# Patient Record
Sex: Female | Born: 1955 | Race: White | Hispanic: No | Smoking: Former smoker
Health system: Southern US, Community
[De-identification: ages and names within clinical notes are randomized; demographics above are authoritative.]

## PROBLEM LIST (undated history)

## (undated) ENCOUNTER — Emergency Department (HOSPITAL_COMMUNITY): Discharge status: Absent without leave | Payer: Medicare Other

## (undated) DIAGNOSIS — I2510 Atherosclerotic heart disease of native coronary artery without angina pectoris: Secondary | ICD-10-CM

## (undated) DIAGNOSIS — J302 Other seasonal allergic rhinitis: Secondary | ICD-10-CM

## (undated) DIAGNOSIS — B009 Herpesviral infection, unspecified: Secondary | ICD-10-CM

## (undated) DIAGNOSIS — K219 Gastro-esophageal reflux disease without esophagitis: Secondary | ICD-10-CM

## (undated) DIAGNOSIS — J439 Emphysema, unspecified: Secondary | ICD-10-CM

## (undated) DIAGNOSIS — K5900 Constipation, unspecified: Secondary | ICD-10-CM

## (undated) DIAGNOSIS — F329 Major depressive disorder, single episode, unspecified: Secondary | ICD-10-CM

## (undated) DIAGNOSIS — F32A Depression, unspecified: Secondary | ICD-10-CM

## (undated) DIAGNOSIS — T7840XA Allergy, unspecified, initial encounter: Secondary | ICD-10-CM

## (undated) DIAGNOSIS — F419 Anxiety disorder, unspecified: Secondary | ICD-10-CM

## (undated) HISTORY — DX: Emphysema, unspecified: J43.9

## (undated) HISTORY — DX: Allergy, unspecified, initial encounter: T78.40XA

## (undated) HISTORY — DX: Depression, unspecified: F32.A

## (undated) HISTORY — DX: Herpesviral infection, unspecified: B00.9

## (undated) HISTORY — PX: DILATION AND CURETTAGE OF UTERUS: SHX78

## (undated) HISTORY — DX: Other seasonal allergic rhinitis: J30.2

## (undated) HISTORY — PX: POLYPECTOMY: SHX149

## (undated) HISTORY — DX: Atherosclerotic heart disease of native coronary artery without angina pectoris: I25.10

## (undated) HISTORY — DX: Major depressive disorder, single episode, unspecified: F32.9

## (undated) HISTORY — DX: Constipation, unspecified: K59.00

## (undated) HISTORY — DX: Anxiety disorder, unspecified: F41.9

## (undated) HISTORY — PX: COLONOSCOPY: SHX174

## (undated) HISTORY — DX: Gastro-esophageal reflux disease without esophagitis: K21.9

---
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
## 1998-03-02 ENCOUNTER — Other Ambulatory Visit: Admission: RE | Admit: 1998-03-02 | Discharge: 1998-03-02 | Payer: Self-pay | Admitting: *Deleted

## 1998-05-09 ENCOUNTER — Encounter: Payer: Self-pay | Admitting: Pulmonary Disease

## 1999-04-20 ENCOUNTER — Other Ambulatory Visit: Admission: RE | Admit: 1999-04-20 | Discharge: 1999-04-20 | Payer: Self-pay | Admitting: *Deleted

## 1999-09-10 ENCOUNTER — Other Ambulatory Visit: Admission: RE | Admit: 1999-09-10 | Discharge: 1999-09-10 | Payer: Self-pay | Admitting: *Deleted

## 2000-11-28 ENCOUNTER — Other Ambulatory Visit: Admission: RE | Admit: 2000-11-28 | Discharge: 2000-11-28 | Payer: Self-pay | Admitting: *Deleted

## 2002-02-12 ENCOUNTER — Other Ambulatory Visit: Admission: RE | Admit: 2002-02-12 | Discharge: 2002-02-12 | Payer: Self-pay | Admitting: *Deleted

## 2003-04-22 ENCOUNTER — Other Ambulatory Visit: Admission: RE | Admit: 2003-04-22 | Discharge: 2003-04-22 | Payer: Self-pay | Admitting: *Deleted

## 2003-06-16 ENCOUNTER — Emergency Department (HOSPITAL_COMMUNITY): Admission: EM | Admit: 2003-06-16 | Discharge: 2003-06-16 | Payer: Self-pay | Admitting: Emergency Medicine

## 2004-04-26 ENCOUNTER — Other Ambulatory Visit: Admission: RE | Admit: 2004-04-26 | Discharge: 2004-04-26 | Payer: Self-pay | Admitting: *Deleted

## 2005-02-07 ENCOUNTER — Observation Stay (HOSPITAL_COMMUNITY): Admission: EM | Admit: 2005-02-07 | Discharge: 2005-02-08 | Payer: Self-pay | Admitting: Emergency Medicine

## 2005-02-08 ENCOUNTER — Encounter (INDEPENDENT_AMBULATORY_CARE_PROVIDER_SITE_OTHER): Payer: Self-pay | Admitting: Cardiology

## 2005-09-06 ENCOUNTER — Other Ambulatory Visit: Admission: RE | Admit: 2005-09-06 | Discharge: 2005-09-06 | Payer: Self-pay | Admitting: *Deleted

## 2006-09-04 ENCOUNTER — Other Ambulatory Visit: Admission: RE | Admit: 2006-09-04 | Discharge: 2006-09-04 | Payer: Self-pay | Admitting: *Deleted

## 2007-11-28 ENCOUNTER — Other Ambulatory Visit: Admission: RE | Admit: 2007-11-28 | Discharge: 2007-11-28 | Payer: Self-pay | Admitting: Gynecology

## 2007-12-16 ENCOUNTER — Emergency Department (HOSPITAL_COMMUNITY): Admission: EM | Admit: 2007-12-16 | Discharge: 2007-12-16 | Payer: Self-pay | Admitting: Emergency Medicine

## 2008-01-24 ENCOUNTER — Ambulatory Visit (HOSPITAL_COMMUNITY): Admission: RE | Admit: 2008-01-24 | Discharge: 2008-01-24 | Payer: Self-pay | Admitting: Obstetrics & Gynecology

## 2009-04-04 ENCOUNTER — Ambulatory Visit (HOSPITAL_COMMUNITY): Admission: RE | Admit: 2009-04-04 | Discharge: 2009-04-04 | Payer: Self-pay | Admitting: Emergency Medicine

## 2010-08-13 NOTE — Consult Note (Signed)
NAMEGWENETTA, Brandy Russell               ACCOUNT NO.:  [id number]   MEDICAL RECORD NO.:  [id number]          PATIENT TYPE:  OBV   LOCATION:  4729                         FACILITY:  [facility]   PHYSICIAN:  Corky Crafts, MDDATE OF BIRTH:  1955-06-01   DATE OF CONSULTATION:  02/07/2005  DATE OF DISCHARGE:                                   CONSULTATION   REFERRING PHYSICIAN:  Hollice Espy, M.D.   PRIMARY CARE PHYSICIAN:  Jethro Bastos, M.D.   CHIEF COMPLAINT:  Chest pain and palpitations.   HISTORY OF PRESENT ILLNESS:  The patient is a 55 year old woman with a  history of smoking who presented with chest pain and palpitations  intermittently over the past several weeks.  The palpitations are a regular  occurrence.  She has not felt her pulse during these episodes.  The chest  pain became very severe earlier today.  She became quite anxious as the pain  woke her up from sleep, radiated to her right chest and she had tingling in  her left arm.  Initially she thought this may be secondary to anxiety,  however,  the pain did not go away.  She presented to the emergency room for  further evaluation.  After receiving 1 mg of Ativan in the emergency room  she felt better.  She states that since she has been in the hospital, she  has not had any chest pain or palpitations.   PAST MEDICAL HISTORY:  1.  Tobacco abuse.  2.  Glucose intolerance by her report.  3.  Anxiety.  4.  Depression.  5.  Hypoglycemia at times.   CURRENT MEDICATIONS:  1.  Wellbutrin.  2.  Estrogen.   ALLERGIES:  1.  PENICILLIN.  2.  SULFA.   SOCIAL HISTORY:  Patient smokes a half pack per day for many years.  Occasional alcohol use.  No IV drug abuse.   REVIEW OF SYSTEMS:  No fever or chills.  No weight loss.  No focal weakness.  She does report chest pain and palpitations.  All other systems negative.   PHYSICAL EXAMINATION:  GENERAL APPEARANCE:  The patient is awake and alert,  in no apparent  distress.  VITAL SIGNS:  Afebrile, blood pressure 84/50, pulse of 95, respiratory rate  16.  NECK:  Supple.  No JVD.  LUNGS:  Clear to auscultation bilaterally.  CARDIOVASCULAR:  Regular rate and rhythm, S1 and S2.  ABDOMEN:  Soft, nontender and nondistended.  EXTREMITIES:  No edema.  Dorsalis pedis pulses 2+ bilaterally.  NEUROLOGIC:  No focal weakness.  SKIN:  No rashes.   LABORATORY DATA:  Troponin negative x2.  Creatinine 0.8.  Hematocrit 38.9.   ECG shows normal sinus rhythm, poor R-wave progression, no STT wave changes.   ASSESSMENT/PLAN:  A 55 year old with a history of tobacco abuse who reports  chest pain and palpitations.   PLAN:  1.  Cardiac:  No evidence of systolic dysfunction by exam.  I question the      accuracy of the ECG reporting of the old anterior myocardial infarction.  Would check echocardiogram to evaluate left ventricular function and      anterior wall motion.  If this is normal, it is a likely erroneous      reading by the ECG machine.  2.  Continue rule out myocardial infarction with enzymes.  3.  Telemetry to evaluate palpitations.  4.  Smoking cessation.  5.  Exercise Cardiolite in a.m. to evaluate for ischemia.           ______________________________  Corky Crafts, MD     JSV/MEDQ  D:  02/07/2005  T:  02/08/2005  Job:  [id number]   cc:   Jethro Bastos, M.D.  Fax: [phone]

## 2010-08-13 NOTE — H&P (Signed)
NAME:  Brandy Russell, Brandy Russell               ACCOUNT NO.:  [id number]   MEDICAL RECORD NO.:  [id number]          PATIENT TYPE:  OBV   LOCATION:  1825                         FACILITY:  [facility]   PHYSICIAN:  Hollice Espy, M.D.DATE OF BIRTH:  1955-07-26   DATE OF ADMISSION:  02/07/2005  DATE OF DISCHARGE:                                HISTORY & PHYSICAL   PRIMARY CARE PHYSICIAN:  Dr. Dorothe Pea   CHIEF COMPLAINT:  Chest palpitations.   Patient is a 55 year old white female with past medical history of tobacco  abuse, anxiety, and reported glucose intolerance who presents to the  emergency room after having repeated episodes of chest palpitation and  discomfort.  She describes that this has been going on now for several  weeks.  Usually described as sudden onset palpitations on the left side of  her chest occasionally radiating to the right side.  Usually these are self-  limited, but may occur at rest.  She thought it might be anxiety or so.  Became more concerned when last night in the middle of the night she woke up  to her left side of her chest pounding, radiating to her right side and up  into her jaw causing her shortness of breath.  She also got a tingling  sensation down her left arm.  She became concerned and called her ex-husband  who brought her into the emergency room.  Patient in the emergency room was  evaluated.  Laboratories including a white count, differential, coags, BMET,  and cardiac enzymes all of which were unremarkable.  Patient's vitals were  unremarkable as well.  She had an EKG done which showed no evidence of any  acute ST depression or ST elevation, although it was read as a possible age  indeterminate anterior infarct which is not impressive to me.  Currently,  patient states she is feeling much better.  She was given one dose of IV  Ativan 1 mg and feels much better.  She denies any headaches, vision  changes, aphasia, chest pain, palpitations, shortness of  breath, wheeze,  cough, abdominal pain, hematuria, dysuria, constipation, diarrhea, focal  extremity numbness, weakness or pain.   REVIEW OF SYSTEMS:  Otherwise negative.   PAST MEDICAL HISTORY:  1.  Tobacco abuse.  2.  Reported glucose intolerance.  3.  Anxiety.  4.  Depression.   MEDICATIONS:  1.  Wellbutrin 200 p.o. daily.  2.  Estrogen.   Reported allergies to PENICILLIN and/or SULFA.   FAMILY HISTORY:  Noted for a CAD.   SOCIAL HISTORY:  She admits to occasional drinking a glass of wine and she  denies any drug use, although she has experimented a long time ago back in  college and she is trying to quit.  She smoked about a pack to a half a pack  a day, but is aggressively trying to quit.   PHYSICAL EXAMINATION:  VITAL SIGNS:  Temperature 99, heart rate 82, blood  pressure 133/99, respirations 18.  Currently her blood pressure has gone  down to 103/60.  She is saturating 100% on room air.  GENERAL:  Patient is alert and oriented x3.  No apparent distress.  HEENT:  Normocephalic, atraumatic.  Her mucous membranes are moist.  She has  no carotid bruits.  HEART:  Regular rate and rhythm.  S1, S2.  LUNGS:  Clear to auscultation bilaterally.  ABDOMEN:  Soft, nontender, nondistended.  Positive bowel sounds.  EXTREMITIES:  No clubbing, cyanosis, edema.   Patient's white count 8.3, H&H 13.7, 38.9, MCV of 100, platelet count 325.  She had a 58% shift.  PT 11.4, INR 0.8, PTT 25.  Sodium 135, potassium 4.1,  chloride 106, bicarbonate 26, BUN 10, creatinine 0.8, glucose 93.  CPK 32.3,  MB less than 1, troponin I less than 0.05.  Second set of enzymes 35.8, less  than 1, troponin I less than 0.05.   ASSESSMENT/PLAN:  1.  Chest palpitations, discomfort, and associated symptoms.  Noted on      telemetry the patient is having frequent PACs which may be the cause of      her palpitation.  This may be all anxiety related.  However, given      reported old anterior infarct seen on  EKG, the patient's history of      tobacco use, and possible glucose intolerance, would favor keeping the      patient overnight for 24-hour observation checking two more sets of      cardiac enzymes and checking a repeat stress test in the morning.  Will      consult [facility] for assistance.  2.  Anxiety.  I have ordered p.r.n. Xanax.  3.  Depression.  Continue p.r.n. Wellbutrin.  4.  Glucose intolerance.  Will watch patient's blood sugars, but for now      appear to be stable.  In addition, will also check a fasting lipid      profile in the morning.      Hollice Espy, M.D.  Electronically Signed     SKK/MEDQ  D:  02/07/2005  T:  02/07/2005  Job:  [id number]   cc:   Jethro Bastos, M.D.  Fax: [phone]

## 2011-05-10 ENCOUNTER — Other Ambulatory Visit: Payer: Self-pay | Admitting: Physician Assistant

## 2011-05-10 MED ORDER — FLUTICASONE-SALMETEROL 250-50 MCG/DOSE IN AEPB: 1.0000 | INHALATION_SPRAY | Freq: Two times a day (BID) | RESPIRATORY_TRACT | Status: DC

## 2011-06-14 ENCOUNTER — Other Ambulatory Visit: Payer: Self-pay | Admitting: *Deleted

## 2011-06-14 MED ORDER — FLUTICASONE-SALMETEROL 250-50 MCG/DOSE IN AEPB: 1.0000 | INHALATION_SPRAY | Freq: Two times a day (BID) | RESPIRATORY_TRACT | Status: DC

## 2012-04-16 ENCOUNTER — Encounter: Payer: Self-pay | Admitting: Family Medicine

## 2012-04-16 ENCOUNTER — Ambulatory Visit (INDEPENDENT_AMBULATORY_CARE_PROVIDER_SITE_OTHER): Payer: Medicare Other | Admitting: Family Medicine

## 2012-04-16 VITALS — BP 102/80 | HR 97 | Temp 98.2°F | Ht 60.25 in | Wt 127.0 lb

## 2012-04-16 DIAGNOSIS — Z7189 Other specified counseling: Secondary | ICD-10-CM

## 2012-04-16 DIAGNOSIS — J439 Emphysema, unspecified: Secondary | ICD-10-CM | POA: Insufficient documentation

## 2012-04-16 DIAGNOSIS — F329 Major depressive disorder, single episode, unspecified: Secondary | ICD-10-CM

## 2012-04-16 DIAGNOSIS — J438 Other emphysema: Secondary | ICD-10-CM

## 2012-04-16 DIAGNOSIS — R739 Hyperglycemia, unspecified: Secondary | ICD-10-CM

## 2012-04-16 DIAGNOSIS — F32A Depression, unspecified: Secondary | ICD-10-CM | POA: Insufficient documentation

## 2012-04-16 DIAGNOSIS — R7309 Other abnormal glucose: Secondary | ICD-10-CM

## 2012-04-16 DIAGNOSIS — E785 Hyperlipidemia, unspecified: Secondary | ICD-10-CM

## 2012-04-16 DIAGNOSIS — Z7689 Persons encountering health services in other specified circumstances: Secondary | ICD-10-CM

## 2012-04-16 LAB — LIPID PANEL
Cholesterol: 278 mg/dL — ABNORMAL HIGH (ref 0–200)
HDL: 142.9 mg/dL (ref >39.00)
Total CHOL/HDL Ratio: 2
Triglycerides: 62 mg/dL (ref 0.0–149.0)
VLDL: 12.4 mg/dL (ref 0.0–40.0)

## 2012-04-16 LAB — LDL CHOLESTEROL, DIRECT: Direct LDL: 102.8 mg/dL

## 2012-04-16 LAB — BASIC METABOLIC PANEL
BUN: 17 mg/dL (ref 6–23)
CO2: 30 mEq/L (ref 19–32)
Calcium: 10.2 mg/dL (ref 8.4–10.5)
Chloride: 100 mEq/L (ref 96–112)
Creatinine, Ser: 0.9 mg/dL (ref 0.4–1.2)
GFR: 66.67 mL/min (ref >60.00)
Glucose, Bld: 98 mg/dL (ref 70–99)
Potassium: 4.4 mEq/L (ref 3.5–5.1)
Sodium: 139 mEq/L (ref 135–145)

## 2012-04-16 LAB — HEMOGLOBIN A1C: Hgb A1c MFr Bld: 5.4 % (ref 4.6–6.5)

## 2012-04-16 NOTE — Progress Notes (Signed)
Chief Complaint  Patient presents with  . Establish Care    HPI:  Brandy Russell is here to establish care. Used to see Dr Lovell Sheehan at another visit. [facility] did work up for her emphysema.  Last PCP and physical: last physical with planned parent hood was in 2010 and was normal - had pap and mammo then.  Has the following chronic problems and concerns today:  Patient Active Problem List  Diagnosis  . Emphysematous COPD  . Depression  -feel COPD has worsened and has SOB, productive cough, and DOE frequently and wants to see pulmonologist, uses albuterol 5x per week -reports needed PCP to place this referral  Health Maintenance: -last exam 2010 - with mammo and pap -never had colonoscopy - wants this -has had pneumovax, had flu this year, wants tdap today  ROS: See pertinent positives and negatives per HPI.  Past Medical History  Diagnosis Date  . Depression   . Emphysema   . Seasonal allergies   . Emphysema     Family History  Problem Relation Age of Onset  . Alcohol abuse Mother   . Alcohol abuse Father   . Uterine cancer Mother   . Lung cancer Father   . Hypertension Father   . Melanoma Brother     History   Social History  . Marital Status: Divorced    Spouse Name: N/A    Number of Children: N/A  . Years of Education: N/A   Social History Main Topics  . Smoking status: Former Smoker    Quit date: 04/17/2007  . Smokeless tobacco: None  . Alcohol Use: No  . Drug Use: No  . Sexually Active: No   Other Topics Concern  . 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 , Disp: , Rfl:  Fluticasone-Salmeterol (ADVAIR) 250-50 MCG/DOSE AEPB, Inhale 1 puff into the lungs 2 (two) times daily., Disp: 1 each, Rfl: 0;  ibuprofen (ADVIL,MOTRIN) 200 MG tablet, Take 200 mg by mouth every 6 (six) hours as needed., Disp: , Rfl: ;  MELATONIN PO, Take by mouth., Disp: , Rfl:   EXAM:  Filed Vitals:   04/16/12 1244  BP: 102/80  Pulse: 97  Temp: 98.2 F (36.8 C)   O2 sats 95% on RA Body mass index is 24.60 kg/(m^2).  GENERAL: vitals reviewed and listed above, alert, oriented, appears well hydrated and in no acute distress  HEENT: atraumatic, conjunttiva clear, no obvious abnormalities on inspection of external nose and ears  NECK: no obvious masses on inspection  LUNGS: prolonged exp - few exp wheezes, rales or rhonchi, good air movement  CV: HRRR, no peripheral edema  MS: moves all extremities without noticeable abnormality  PSYCH: pleasant and cooperative, no obvious depression or anxiety  ASSESSMENT AND PLAN:  Discussed the following assessment and plan:  1. Emphysematous COPD  Ambulatory referral to Pulmonology, Ambulatory referral to Gastroenterology, Basic metabolic panel  2. Depression  Basic metabolic panel  3. Establishing care with new doctor, encounter for    4. Hyperglycemia  Hemoglobin A1c  5. Hyperlipemia  Lipid Panel   -NON-FASTING LABS -appears comfortable with normal O2 sats but reports severe emphysema with worsening symptoms over the last 3-4 months and wants to see a pulmonologist for re-assessment of disease severity and treatment. Referral placed -has not had routine care in some time - tdap given today, advised to schedule mammo and info provided, referral for colonoscopy sent -will have her follow up for her physical exam  -We reviewed the PMH, PSH, FH, SH, Meds and  Allergies. -We provided refills for any medications we will prescribe as needed. -We addressed current concerns per orders and patient instructions. -We have asked for records for pertinent exams, studies, vaccines and notes from previous providers. -We have advised patient to follow up per instructions below.   -Patient advised to return or notify a doctor immediately if symptoms worsen or persist or new concerns arise.  Patient Instructions  -We have ordered labs or studies at this visit. It can take up to 1-2 weeks for results and processing. We will contact you with instructions IF your results are abnormal. Normal results will be released to your [facility]. If you have not heard from [name] or can not find your results in [facility] in 2 weeks please contact our office.  -We placed a referral for you as discussed to the PULMONOLOGIST for your lung disease and to the gastroenterologist for your COLONOSCOPY. It usually takes about 1-2 weeks to process and schedule this referral. If you have not heard from [name] regarding this appointment in 2 weeks please contact our office.  -please schedule your mammogram  -PLEASE SIGN UP FOR MYCHART TODAY   We recommend the following healthy lifestyle measures: - eat a healthy diet consisting of lots of vegetables, fruits, beans, nuts, seeds, healthy meats such as white chicken and fish and whole grains.  - avoid fried foods, fast food, processed foods, sodas, red meet and other fattening foods.  - get a least 150 minutes of aerobic exercise per week.   Follow up in: the next month for a physical exam      KIM, HANNAH R.

## 2012-04-16 NOTE — Patient Instructions (Addendum)
-  We have ordered labs or studies at this visit. It can take up to 1-2 weeks for results and processing. We will contact you with instructions IF your results are abnormal. Normal results will be released to your [facility]. If you have not heard from [name] or can not find your results in [facility] in 2 weeks please contact our office.  -We placed a referral for you as discussed to the PULMONOLOGIST for your lung disease and to the gastroenterologist for your COLONOSCOPY. It usually takes about 1-2 weeks to process and schedule this referral. If you have not heard from [name] regarding this appointment in 2 weeks please contact our office.  -please schedule your mammogram  -PLEASE SIGN UP FOR MYCHART TODAY   We recommend the following healthy lifestyle measures: - eat a healthy diet consisting of lots of vegetables, fruits, beans, nuts, seeds, healthy meats such as white chicken and fish and whole grains.  - avoid fried foods, fast food, processed foods, sodas, red meet and other fattening foods.  - get a least 150 minutes of aerobic exercise per week.   Follow up in: the next month for a physical exam

## 2012-04-17 ENCOUNTER — Telehealth: Payer: Self-pay | Admitting: Family Medicine

## 2012-04-17 NOTE — Telephone Encounter (Signed)
Labs look good.

## 2012-04-17 NOTE — Telephone Encounter (Signed)
Called and spoke with pt and pt is aware.  

## 2012-04-30 ENCOUNTER — Encounter: Payer: Self-pay | Admitting: Family Medicine

## 2012-04-30 ENCOUNTER — Telehealth: Payer: Self-pay | Admitting: Family Medicine

## 2012-04-30 NOTE — Progress Notes (Signed)
Received some OV notes from [facility]. Notes poor quality, handwritten, difficult to read - but appears was on Siriva and Advair in the past for COPD and Wellbutrin. Spirometry report from 03/2009 showed "vere severe obstruction"  CT angio chest from 03/2009 per report showed 3-85mm RUL nodules with recs to repeat scan in 1 year. Pt referred to pulm at initial visit. Will have staff call pt to ensure she had repeat CT scan for nodules since and if not advise repeat scan or that she discuss with pulm.

## 2012-04-30 NOTE — Telephone Encounter (Signed)
Also, please fax spirometry and scan results to pulm so they will have them for her appt. then scan in. Thanks.

## 2012-04-30 NOTE — Telephone Encounter (Signed)
Received some of her records. She had pulm nodules on CT scan in 2011 and was supposed to have repeat scan one year from then. Please call and let her know and see if she had repeat scan. Advise her to discuss with pulm at her upcoming appt and will need repeat scan. If they don't do scan she will need to let us know so we can order it.

## 2012-05-03 NOTE — Telephone Encounter (Signed)
Left a message for pt to return call 

## 2012-05-03 NOTE — Telephone Encounter (Signed)
Returned pt's call; left another message for return call.

## 2012-05-03 NOTE — Telephone Encounter (Signed)
Results faxed to Dr. Evlyn Courier office for upcoming appt.

## 2012-05-04 ENCOUNTER — Encounter: Payer: Self-pay | Admitting: Internal Medicine

## 2012-05-04 NOTE — Telephone Encounter (Signed)
Called and spoke with pt and pt states her previous physician never told her about the nodule.  Advised pt to discuss this with pulmonary doctor at visit.  Pt is aware.  Records faxed.

## 2012-05-10 ENCOUNTER — Institutional Professional Consult (permissible substitution): Payer: Medicare Other | Admitting: Pulmonary Disease

## 2012-05-15 ENCOUNTER — Other Ambulatory Visit: Payer: Self-pay | Admitting: Family Medicine

## 2012-05-15 DIAGNOSIS — Z1231 Encounter for screening mammogram for malignant neoplasm of breast: Secondary | ICD-10-CM

## 2012-05-24 ENCOUNTER — Ambulatory Visit (HOSPITAL_COMMUNITY)
Admission: RE | Admit: 2012-05-24 | Discharge: 2012-05-24 | Disposition: A | Discharge status: Home / Self Care | Payer: Medicare Other | Source: Ambulatory Visit | Attending: Family Medicine | Admitting: Family Medicine

## 2012-05-24 DIAGNOSIS — Z1231 Encounter for screening mammogram for malignant neoplasm of breast: Secondary | ICD-10-CM

## 2012-05-30 ENCOUNTER — Ambulatory Visit (INDEPENDENT_AMBULATORY_CARE_PROVIDER_SITE_OTHER): Payer: Medicare Other | Admitting: Pulmonary Disease

## 2012-05-30 ENCOUNTER — Encounter: Payer: Self-pay | Admitting: Pulmonary Disease

## 2012-05-30 ENCOUNTER — Ambulatory Visit (INDEPENDENT_AMBULATORY_CARE_PROVIDER_SITE_OTHER)
Admission: RE | Admit: 2012-05-30 | Discharge: 2012-05-30 | Disposition: A | Discharge status: Home / Self Care | Payer: Medicare Other | Source: Ambulatory Visit | Attending: Pulmonary Disease | Admitting: Pulmonary Disease

## 2012-05-30 ENCOUNTER — Other Ambulatory Visit: Payer: Medicare Other

## 2012-05-30 ENCOUNTER — Telehealth: Payer: Self-pay | Admitting: Pulmonary Disease

## 2012-05-30 VITALS — BP 110/72 | HR 70 | Temp 97.5°F | Ht 65.5 in | Wt 133.0 lb

## 2012-05-30 DIAGNOSIS — R0989 Other specified symptoms and signs involving the circulatory and respiratory systems: Secondary | ICD-10-CM

## 2012-05-30 DIAGNOSIS — R9430 Abnormal result of cardiovascular function study, unspecified: Secondary | ICD-10-CM | POA: Insufficient documentation

## 2012-05-30 DIAGNOSIS — J439 Emphysema, unspecified: Secondary | ICD-10-CM

## 2012-05-30 DIAGNOSIS — IMO0002 Reserved for concepts with insufficient information to code with codable children: Secondary | ICD-10-CM | POA: Insufficient documentation

## 2012-05-30 DIAGNOSIS — J438 Other emphysema: Secondary | ICD-10-CM

## 2012-05-30 DIAGNOSIS — R918 Other nonspecific abnormal finding of lung field: Secondary | ICD-10-CM

## 2012-05-30 MED ORDER — AEROCHAMBER MV MISC: Status: DC

## 2012-05-30 MED ORDER — MOMETASONE FURO-FORMOTEROL FUM 100-5 MCG/ACT IN AERO: 2.0000 | INHALATION_SPRAY | Freq: Two times a day (BID) | RESPIRATORY_TRACT | Status: DC

## 2012-05-30 NOTE — Progress Notes (Signed)
Chief Complaint  Patient presents with  . Advice Only    refer for COPD. C/o SOB w/ rest and activity, dry cough. no wheezing, chest tx lately. ct 2011 did show nodule per pt.     History of Present Illness: Brandy Russell is a 57 y.o. female former smoker for evaluation of COPD/emphysema.  She started smoking at age 53.  She smoked up to 2 packs per day.  She quit smoking in 2008.  She was diagnosed with COPD and emphysema 10 years ago.  Several of her family members have emphysema also.  She did not have insurance until recently, and had trouble arranging for medical follow up.  She now has insurance.  She has not had recent chest xray or PFT's.  Her symptoms are sporadic.  She can feel great one day, and then miserable the next.  She notices more trouble with pollen especially in the Fall.  She also has trouble with hot and dry weather.  She used to get allergy shots as a child.  She can walk about 1 block at a slow pace.  She avoids walking stairs.  She gets occasional cough and wheeze.  She usually does not have sputum.  She denies hemoptysis, fever, or leg swelling.  She tried spiriva before, but this didn't help.  She has been using advair and albuterol.  These help.  She notices frequent episodes of thrush and hoarseness while using advair.  She is from [place].  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  She has two pet dogs.  She used to get pneumonia a lot, and was especially bad in her 53's.  There is no history of tuberculosis.  Tests: Echo 02/08/05 >> EF 40 to 50% CT chest 04/04/09 >> apical centrilobular emphysema, 3 mm RUL nodule, 4 mm RUL nodule Spirometry 05/30/12 >> FEV1 0.69 (26%), FEV1% 40  Brandy Russell  has a past medical history of Depression; Emphysema; and Seasonal allergies.  Radha Coggins  has past surgical history that includes Tonsillectomy and adenoidectomy (1962) and Dilation and curettage of uterus.  Prior to Admission medications    Medication Sig Start Date End Date Taking? Authorizing Provider  albuterol (PROVENTIL HFA;VENTOLIN HFA) 108 (90 BASE) MCG/ACT inhaler Inhale 2 puffs into the lungs every 6 (six) hours as needed.   Yes Historical Provider, MD  buPROPion (WELLBUTRIN SR) 150 MG 12 hr tablet Take 150 mg by mouth 2 (two) times daily.   Yes Historical Provider, MD  Fluticasone-Salmeterol (ADVAIR) 250-50 MCG/DOSE AEPB Inhale 1 puff into the lungs 2 (two) times daily. 06/14/11  Yes Rickard Patience, PA-C  ibuprofen (ADVIL,MOTRIN) 200 MG tablet Take 200 mg by mouth every 6 (six) hours as needed.   Yes Historical Provider, MD  MELATONIN PO Take by mouth.   Yes Historical Provider, MD    Allergies  Allergen Reactions  . Penicillins   . Sulfa Antibiotics     family history includes Alcohol abuse in her father and mother; Emphysema in her father and mother; Hypertension in her father; Lung cancer in her father; Melanoma in her brother; and Uterine cancer in her mother.   reports that she quit smoking about 6 years ago. Her smoking use included Cigarettes. She has a 70 pack-year smoking history. She does not have any smokeless tobacco history on file. She reports that she does not drink alcohol or use illicit drugs.  Review of Systems  Constitutional: Negative for appetite change and unexpected weight  change.  HENT: Negative for ear pain, congestion, sore throat, sneezing, trouble swallowing and dental problem.   Respiratory: Positive for cough and shortness of breath.   Cardiovascular: Negative for chest pain, palpitations and leg swelling.  Gastrointestinal: Negative for abdominal pain.  Musculoskeletal: Negative for joint swelling.  Skin: Negative for rash.  Neurological: Negative for headaches.  Psychiatric/Behavioral: Positive for dysphoric mood. The patient is not nervous/anxious.    Physical Exam:  General - No distress ENT - No sinus tenderness, mild erythema posterior pharynx, no oral exudate, no LAN, no  thyromegaly, TM clear, pupils equal/reactive Cardiac - s1s2 regular, no murmur, pulses symmetric Chest - decreased breath sounds, prolonged exhalation, bronchial breath sounds more at bases, no wheeze Back - No focal tenderness Abd - Soft, non-tender, no organomegaly, + bowel sounds Ext - No edema Neuro - Normal strength, cranial nerves intact Skin - No rashes Psych - Normal mood, and behavior  Assessment/Plan:  Coralyn Helling, MD [facility] Pager:  [phone] 05/30/2012, 10:41 AM

## 2012-05-30 NOTE — Telephone Encounter (Signed)
Dg Chest 2 View  05/30/2012  *RADIOLOGY REPORT*  Clinical Data: Smoker, emphysema, increasing dyspnea  CHEST - 2 VIEW  Comparison: Prior CT chest 04/04/2009; prior chest x-ray 12/16/2007  Findings: Pulmonary hyperexpansion with increased lucency in the bilateral upper lobes consistent with underlying emphysema.  There are mild central bronchitic changes.  No pneumothorax, pulmonary edema, pleural effusion or focal airspace consolidation.  Prominent nipple shadows again noted bilaterally.  No suspicious pulmonary nodule.  No acute osseous abnormality.  IMPRESSION:  1.  Stable chronic changes of emphysema and COPD.  2.  No acute cardiopulmonary process.   Original Report Authenticated By: Malachy Moan, M.D.     Discussed results with pt.   No change to current treatment plan.

## 2012-05-30 NOTE — Assessment & Plan Note (Signed)
Echo from 2006 showed low EF.  Depending on her status after adjustments to her COPD regimen she may need additional cardiac evaluation.

## 2012-05-30 NOTE — Progress Notes (Deleted)
  Subjective:    Patient ID: Brandy Russell, female    DOB: 1956/03/21, 57 y.o.   MRN: [id number]  HPI    Review of Systems  Constitutional: Negative for appetite change and unexpected weight change.  HENT: Negative for ear pain, congestion, sore throat, sneezing, trouble swallowing and dental problem.   Respiratory: Positive for cough and shortness of breath.   Cardiovascular: Negative for chest pain, palpitations and leg swelling.  Gastrointestinal: Negative for abdominal pain.  Musculoskeletal: Negative for joint swelling.  Skin: Negative for rash.  Neurological: Negative for headaches.  Psychiatric/Behavioral: Positive for dysphoric mood. The patient is not nervous/anxious.        Objective:   Physical Exam        Assessment & Plan:

## 2012-05-30 NOTE — Assessment & Plan Note (Signed)
CT chest from 2011 showed multiple nodules.  She has not had recent imaging studies.  Will repeat chest xray today, and then determine if she needs repeat CT chest with contrast.

## 2012-05-30 NOTE — Patient Instructions (Addendum)
Stop advair Dulera two puffs twice per day, and rinse mouth after each use  Albuterol two puffs as needed for cough, wheeze, or chest congestion Use spacer device with inhalers Lab test and chest xray today Follow up in 4 weeks

## 2012-06-03 ENCOUNTER — Encounter: Payer: Self-pay | Admitting: Pulmonary Disease

## 2012-06-06 ENCOUNTER — Encounter: Payer: Self-pay | Admitting: Pulmonary Disease

## 2012-06-06 ENCOUNTER — Telehealth: Payer: Self-pay | Admitting: *Deleted

## 2012-06-06 NOTE — Telephone Encounter (Signed)
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

## 2012-06-06 NOTE — Telephone Encounter (Signed)
Dr. Craige Cotta,  This pt is wanting to know the results of her spiro test. She sent [name] a mychart message about this.  Thanks!!

## 2012-06-06 NOTE — Telephone Encounter (Signed)
Please inform her that her spirometry showed severe airflow obstruction consistent with her known history of COPD and emphysema.

## 2012-06-06 NOTE — Telephone Encounter (Signed)
I sent this to pt in EMail.

## 2012-06-07 ENCOUNTER — Ambulatory Visit (AMBULATORY_SURGERY_CENTER): Payer: Medicare Other | Admitting: *Deleted

## 2012-06-07 ENCOUNTER — Encounter: Payer: Self-pay | Admitting: Internal Medicine

## 2012-06-07 VITALS — Ht 65.0 in | Wt 131.6 lb

## 2012-06-07 DIAGNOSIS — Z1211 Encounter for screening for malignant neoplasm of colon: Secondary | ICD-10-CM

## 2012-06-07 MED ORDER — NA SULFATE-K SULFATE-MG SULF 17.5-3.13-1.6 GM/177ML PO SOLN: 1.0000 | Freq: Once | ORAL | Status: DC

## 2012-06-08 LAB — ALPHA-1 ANTITRYPSIN PHENOTYPE

## 2012-06-14 ENCOUNTER — Telehealth: Payer: Self-pay | Admitting: Pulmonary Disease

## 2012-06-14 NOTE — Telephone Encounter (Signed)
Pt is c/o increased SOB since taking the dulera and stopping the advair. PT states she stopped the dulera and returned to the advair that she had on hand. Pt wanted to let Dr. Craige Cotta know this. Pt has an appt on 06-27-12. PT denied any chest tightness, cough or wheezing at this time. Please advise if you have any other recs for the pt. Carron Curie, CMA Carron Curie, CMA Allergies  Allergen Reactions  . Penicillins   . Sulfa Antibiotics

## 2012-06-15 NOTE — Telephone Encounter (Signed)
Spoke with pt.  Pt states this is "no rush."  She is ok with waiting on Monday for Dr. Craige Cotta to return.  Pt states she did stop the dulera because it wasn't helping.  But now that she is back on advair, the advair is still causing thrush.  Pt states she will try to use baking soda based toothpaste each time after using inhaler and will cont to rinse mouth really well.  She would like to wait until Dr. Craige Cotta returns on Monday for any further advice or recs.  Dr. Craige Cotta, pls advise.  Thank you.

## 2012-06-15 NOTE — Telephone Encounter (Signed)
lmomtcb x1 for pt. VS is not due back into the office until Monday. Is she okay with waiting until he returns?

## 2012-06-18 MED ORDER — NYSTATIN 100000 UNIT/ML MT SUSP
500000.0000 [IU] | Freq: Four times a day (QID) | OROMUCOSAL | Status: DC
Start: 2012-06-18 — End: 2013-01-09

## 2012-06-18 NOTE — Telephone Encounter (Signed)
I spoke with pt and is aware of VS recs. She voiced her understanding and needed nothing further.

## 2012-06-18 NOTE — Telephone Encounter (Signed)
Okay to resume advair.  Please inform pt that I have sent script for nystatin for her thrush.  She should use 5 ml swish and spit or swallow four times daily until bottle is finished.  She should call back if thrush persists.

## 2012-06-21 ENCOUNTER — Other Ambulatory Visit: Payer: Self-pay | Admitting: Internal Medicine

## 2012-06-21 ENCOUNTER — Encounter: Payer: Self-pay | Admitting: Internal Medicine

## 2012-06-21 ENCOUNTER — Ambulatory Visit (AMBULATORY_SURGERY_CENTER): Payer: Medicare Other | Admitting: Internal Medicine

## 2012-06-21 VITALS — BP 115/65 | HR 64 | Temp 97.4°F | Resp 25 | Ht 65.0 in | Wt 131.0 lb

## 2012-06-21 DIAGNOSIS — D126 Benign neoplasm of colon, unspecified: Secondary | ICD-10-CM

## 2012-06-21 DIAGNOSIS — K5730 Diverticulosis of large intestine without perforation or abscess without bleeding: Secondary | ICD-10-CM

## 2012-06-21 DIAGNOSIS — Z1211 Encounter for screening for malignant neoplasm of colon: Secondary | ICD-10-CM

## 2012-06-21 MED ORDER — SODIUM CHLORIDE 0.9 % IV SOLN: 500.0000 mL | INTRAVENOUS | Status: DC

## 2012-06-21 NOTE — Progress Notes (Signed)
Patient did not have preoperative order for IV antibiotic SSI prophylaxis. (G8918)  Patient did not experience any of the following events: a burn prior to discharge; a fall within the facility; wrong site/side/patient/procedure/implant event; or a hospital transfer or hospital admission upon discharge from the facility. (G8907)  

## 2012-06-21 NOTE — Patient Instructions (Addendum)
Two small polyps were removed. They look benign (not cancer) - do not worry about these.  You also have diverticulosis - a common condition that usually does not cause problems.  I will let you know pathology results and when to have another routine colonoscopy by mail.  Thank you for choosing me and [facility].  Iva Boop, MD, FACG  YOU HAD AN ENDOSCOPIC PROCEDURE TODAY AT THE [facility]: Refer to the procedure report that was given to you for any specific questions about what was found during the examination.  If the procedure report does not answer your questions, please call your gastroenterologist to clarify.  If you requested that your care partner not be given the details of your procedure findings, then the procedure report has been included in a sealed envelope for you to review at your convenience later.  YOU SHOULD EXPECT: Some feelings of bloating in the abdomen. Passage of more gas than usual.  Walking can help get rid of the air that was put into your GI tract during the procedure and reduce the bloating. If you had a lower endoscopy (such as a colonoscopy or flexible sigmoidoscopy) you may notice spotting of blood in your stool or on the toilet paper. If you underwent a bowel prep for your procedure, then you may not have a normal bowel movement for a few days.  DIET: Your first meal following the procedure should be a light meal and then it is ok to progress to your normal diet.  A half-sandwich or bowl of soup is an example of a good first meal.  Heavy or fried foods are harder to digest and may make you feel nauseous or bloated.  Likewise meals heavy in dairy and vegetables can cause extra gas to form and this can also increase the bloating.  Drink plenty of fluids but you should avoid alcoholic beverages for 24 hours.  ACTIVITY: Your care partner should take you home directly after the procedure.  You should plan to take it easy, moving slowly for  the rest of the day.  You can resume normal activity the day after the procedure however you should NOT DRIVE or use heavy machinery for 24 hours (because of the sedation medicines used during the test).    SYMPTOMS TO REPORT IMMEDIATELY: A gastroenterologist can be reached at any hour.  During normal business hours, 8:30 AM to 5:00 PM Monday through Friday, call [phone].  After hours and on weekends, please call the GI answering service at [phone] who will take a message and have the physician on call contact you.   Following lower endoscopy (colonoscopy or flexible sigmoidoscopy):  Excessive amounts of blood in the stool  Significant tenderness or worsening of abdominal pains  Swelling of the abdomen that is new, acute ollowing upp Fever of 100F or higher  Fer endoscopy (EGD)   FOLLOW UP: If any biopsies were taken you will be contacted by phone or by letter within the next 1-3 weeks.  Call your gastroenterologist if you have not heard about the biopsies in 3 weeks.  Our staff will call the home number listed on your records the next business day following your procedure to check on you and address any questions or concerns that you may have at that time regarding the information given to you following your procedure. This is a courtesy call and so if there is no answer at the home number and we have not heard from you  through the emergency physician on call, we will assume that you have returned to your regular daily activities without incident.  SIGNATURES/CONFIDENTIALITY: You and/or your care partner have signed paperwork which will be entered into your electronic medical record.  These signatures attest to the fact that that the information above on your After Visit Summary has been reviewed and is understood.  Full responsibility of the confidentiality of this discharge information lies with you and/or your care-partner.

## 2012-06-21 NOTE — Op Note (Signed)
[facility] [address] [place], [address]   COLONOSCOPY PROCEDURE REPORT  PATIENT: Brandy Russell, Brandy Russell  MR#: [id number] BIRTHDATE: 07/05/1955 , 57  yrs. old GENDER: Female ENDOSCOPIST: Iva Boop, MD, [facility] REFERRED BY:   Kriste Basque, DO PROCEDURE DATE:  06/21/2012 PROCEDURE:   Colonoscopy with biopsy and snare polypectomy ASA CLASS:   Class III INDICATIONS:average risk screening. MEDICATIONS: propofol (Diprivan) 350mg  IV, MAC sedation, administered by CRNA, and These medications were titrated to patient response per physician's verbal order  DESCRIPTION OF PROCEDURE:   After the risks benefits and alternatives of the procedure were thoroughly explained, informed consent was obtained.  A digital rectal exam revealed no abnormalities of the rectum.   The LB [id number]  endoscope was introduced through the anus and advanced to the cecum, which was identified by both the appendix and ileocecal valve. No adverse events experienced.   The quality of the prep was Suprep excellent The instrument was then slowly withdrawn as the colon was fully examined.      COLON FINDINGS: Two polypoid shaped sessile polyps ranging between 3-86mm in size were found at the cecum(79mm) and in the sigmoid colon (5mm).  A polypectomy was performed with cold forceps (cecum) and with a cold snare (sigmoid).  The resection was complete and the polyp tissue was completely retrieved.   Moderate diverticulosis was noted in the sigmoid colon.   The colon mucosa was otherwise normal.   A right colon retroflexion was performed.  Retroflexed views revealed no abnormalities. The time to cecum=3 minutes 54 seconds.  Withdrawal time=11 minutes 13 seconds.  The scope was withdrawn and the procedure completed. COMPLICATIONS: There were no complications.  ENDOSCOPIC IMPRESSION: 1.   Two sessile polyps ranging between 3-66mm in size were found at the cecum and in the sigmoid colon;  polypectomy was performed with cold forceps and with a cold snare 2.   Moderate diverticulosis was noted in the sigmoid colon 3.   The colon mucosa was otherwise normal -excellent prep  RECOMMENDATIONS: Timing of repeat colonoscopy will be determined by pathology findings.   eSigned:  Iva Boop, MD, [facility] 06/21/2012 12:36 PM cc: The Patient and Kriste Basque, DO

## 2012-06-21 NOTE — Progress Notes (Signed)
Pt very upset in admitting.  She states that is hypoglycemic and gets very angry and mean when she has not eaten.  Blood sugar 93.  Per Dr Leone Payor will hang D5.  Apologized to pt for the delay.

## 2012-06-21 NOTE — Progress Notes (Signed)
Called to room to assist during endoscopic procedure.  Patient ID and intended procedure confirmed with present staff. Received instructions for my participation in the procedure from the performing physician.  

## 2012-06-22 ENCOUNTER — Telehealth: Payer: Self-pay

## 2012-06-22 NOTE — Telephone Encounter (Signed)
  Follow up Call-  Call back number 06/21/2012  Post procedure Call Back phone  # [phone] cell  Permission to leave phone message Yes     Patient questions:  Do you have a fever, pain , or abdominal swelling? no Pain Score  0 *  Have you tolerated food without any problems? yes  Have you been able to return to your normal activities? yes  Do you have any questions about your discharge instructions: Diet   no Medications  no Follow up visit  no  Do you have questions or concerns about your Care? no  Actions: * If pain score is 4 or above: No action needed, pain <4.

## 2012-06-24 NOTE — Assessment & Plan Note (Signed)
She has extensive history of smoking.  She has extensive family history of COPD and emphysema.  She has been intolerant of spiriva.  She reports worsening symptoms with exposures to allergens, dust, weather change, and smoke.  She may have an allergy/asthma component to her COPD also.  She has noticed frequent episodes of thrush with advair.  Will change her to dulera with spacer device.  She is to continue albuterol as needed.  Will check her alpha 1 anti-trypsin level.   Will re-assess her status at next visit, and then likely arrange for pulmonary rehab referral.

## 2012-06-27 ENCOUNTER — Encounter: Payer: Self-pay | Admitting: Pulmonary Disease

## 2012-06-27 ENCOUNTER — Encounter: Payer: Self-pay | Admitting: Internal Medicine

## 2012-06-27 ENCOUNTER — Ambulatory Visit (INDEPENDENT_AMBULATORY_CARE_PROVIDER_SITE_OTHER): Payer: Medicare Other | Admitting: Pulmonary Disease

## 2012-06-27 VITALS — BP 108/72 | HR 66 | Temp 97.6°F | Ht 67.0 in | Wt 131.0 lb

## 2012-06-27 DIAGNOSIS — J439 Emphysema, unspecified: Secondary | ICD-10-CM

## 2012-06-27 DIAGNOSIS — J438 Other emphysema: Secondary | ICD-10-CM

## 2012-06-27 DIAGNOSIS — R918 Other nonspecific abnormal finding of lung field: Secondary | ICD-10-CM

## 2012-06-27 NOTE — Assessment & Plan Note (Addendum)
Stable on advair and prn albuterol.  She did not notice benefit from spiriva.  Will arrange for referral to pulmonary rehab.  Discussed how her lung disease will likely impose signigicant limitation on her activities, including employment.  Will arrange for room air ONO.

## 2012-06-27 NOTE — Patient Instructions (Signed)
Will arrange for referral to pulmonary rehab  Follow up in 6 months 

## 2012-06-27 NOTE — Progress Notes (Signed)
Quick Note:  Diminutive cecal adenoma and a diminutive hyperplastic polyp from sigmoid Repeat colonoscopy about 05/2017 ______

## 2012-06-27 NOTE — Progress Notes (Signed)
Chief Complaint  Patient presents with  . Follow-up    Pt reports her breathing is little worse right now. c/o slight dry cough, slight increase in wheezing and chest tx d/t cleaning.  CAT score 22    History of Present Illness: Brandy Russell is a 57 y.o. 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  She was doing okay until her house was cleaned recently.  She is trying to sell her house.  She developed more chest congestion.  She switched back to advair.  Dulera didn't help.  She gets tired easily.  She has noticed leg cramps while asleep.  She is not having wheeze, or chest pain.  Tests: Echo 02/08/05 >> EF 40 to 50% CT chest 04/04/09 >> apical centrilobular emphysema, 3 mm RUL nodule, 4 mm RUL nodule Spirometry 05/30/12 >> FEV1 0.69 (26%), FEV1% 40 A1AT 3.05/14 >> 112, PI*MS  Brandy Russell  has a past medical history of Depression; Emphysema; and Seasonal allergies.  Brandy Russell  has past surgical history that includes Tonsillectomy and adenoidectomy (1962) and Dilation and curettage of uterus.  Prior to Admission medications   Medication Sig Start Date End Date Taking? Authorizing Provider  albuterol (PROVENTIL HFA;VENTOLIN HFA) 108 (90 BASE) MCG/ACT inhaler Inhale 2 puffs into the lungs every 6 (six) hours as needed.   Yes Historical Provider, MD  buPROPion (WELLBUTRIN SR) 150 MG 12 hr tablet Take 150 mg by mouth 2 (two) times daily.   Yes Historical Provider, MD  Fluticasone-Salmeterol (ADVAIR) 250-50 MCG/DOSE AEPB Inhale 1 puff into the lungs 2 (two) times daily. 06/14/11  Yes Rickard Patience, PA-C  ibuprofen (ADVIL,MOTRIN) 200 MG tablet Take 200 mg by mouth every 6 (six) hours as needed.   Yes Historical Provider, MD  MELATONIN PO Take by mouth.   Yes Historical Provider, MD    Allergies  Allergen Reactions  . Penicillins   . Sulfa Antibiotics     GI upset per the pt   Physical Exam:  General - No distress ENT - No sinus tenderness, no oral exudate, no LAN Cardiac -  s1s2 regular, no murmur Chest - decreased breath sounds, prolonged exhalation, bronchial breath sounds more at bases, no wheeze Back - No focal tenderness Abd - Soft, non-tender Ext - No edema Neuro - Normal strength Skin - No rashes Psych - Normal mood, and behavior  Assessment/Plan:  Coralyn Helling, MD [facility] Pager:  [phone] 06/27/2012, 10:50 AM

## 2012-06-27 NOTE — Assessment & Plan Note (Signed)
Recent chest xray was okay.  Will monitor clinically.

## 2012-07-08 ENCOUNTER — Other Ambulatory Visit: Payer: Self-pay | Admitting: Pulmonary Disease

## 2012-07-09 ENCOUNTER — Telehealth: Payer: Self-pay | Admitting: Critical Care Medicine

## 2012-07-09 MED ORDER — FLUTICASONE-SALMETEROL 250-50 MCG/DOSE IN AEPB: 1.0000 | INHALATION_SPRAY | Freq: Two times a day (BID) | RESPIRATORY_TRACT | Status: DC

## 2012-07-09 NOTE — Telephone Encounter (Signed)
Pt needs refill on advair. Apparently pharmacy called but nothing in EPIC  I sent 6 refills in for the pt for the advair.

## 2012-07-10 ENCOUNTER — Telehealth: Payer: Self-pay | Admitting: Pulmonary Disease

## 2012-07-10 NOTE — Telephone Encounter (Signed)
Noted  

## 2012-07-10 NOTE — Telephone Encounter (Signed)
ONO with RA 07/03/12 >> Test time 8 hrs 33 min.  Mean SpO2 92.6%, low SpO2 87%.  Spent 8 min with SpO2 < 88%.  Will have my nurse inform pt that ONO looked good.  She does not need nocturnal supplemental oxygen at this time.

## 2012-07-12 NOTE — Telephone Encounter (Signed)
Will send results to pt as mychart message.

## 2012-07-23 ENCOUNTER — Encounter (HOSPITAL_COMMUNITY): Payer: Self-pay

## 2012-07-23 ENCOUNTER — Encounter (HOSPITAL_COMMUNITY)
Admission: RE | Admit: 2012-07-23 | Discharge: 2012-07-23 | Disposition: A | Discharge status: Home / Self Care | Payer: Medicare Other | Source: Ambulatory Visit | Attending: Pulmonary Disease | Admitting: Pulmonary Disease

## 2012-07-23 DIAGNOSIS — J438 Other emphysema: Secondary | ICD-10-CM | POA: Insufficient documentation

## 2012-07-23 DIAGNOSIS — Z5189 Encounter for other specified aftercare: Secondary | ICD-10-CM | POA: Insufficient documentation

## 2012-07-23 NOTE — Progress Notes (Signed)
Brandy Russell came today for orientation to Pulmonary Rehab.  Anxious appearing thin white female.  Alert oriented.   Reassured.  Medical history and medications reviewed with patient.  Nurse assessment done, heart rate regular@75 , lungs clear, distant breath sounds.  No peripheral edema present, pulses +3 bilateral.  Denies any discomfort today, does have some occasional back pain which she rates 5-6 on 1-10 pain scale.  She was teary during the assessment at intervals, PHQ screening 12.  We did discuss counciling, she is currently on medication.  We will monitor, encourage and support.  Demonstration and practice of PLB using pulse oximeter.  Patient able to return demonstration satisfactorily.  Safety and hand hygiene in the exercise area reviewed with patient.  Patient voices understanding.  She plans to start exercise on 07/24/2012.   6 min walk test will be done on first visit.

## 2012-07-24 ENCOUNTER — Encounter (HOSPITAL_COMMUNITY): Admission: RE | Admit: 2012-07-24 | Payer: Medicare Other | Source: Ambulatory Visit

## 2012-07-26 ENCOUNTER — Encounter (HOSPITAL_COMMUNITY)
Admission: RE | Admit: 2012-07-26 | Discharge: 2012-07-26 | Disposition: A | Discharge status: Home / Self Care | Payer: Medicare Other | Source: Ambulatory Visit | Attending: Pulmonary Disease | Admitting: Pulmonary Disease

## 2012-07-26 DIAGNOSIS — J438 Other emphysema: Secondary | ICD-10-CM | POA: Insufficient documentation

## 2012-07-26 DIAGNOSIS — Z5189 Encounter for other specified aftercare: Secondary | ICD-10-CM | POA: Insufficient documentation

## 2012-07-26 NOTE — Progress Notes (Signed)
First day of exercise in Pulmonary Rehab.  Oriented to equipment use and safety.  Demonstration and practice of PLB on each exercise station. 6  min walk test was done on first exercise station.  She walked 1150 feet in 6 min without rest break.  Very anxious on arrival, felt much more relaxed at end of session.  We will encourage and support this nice lady.  Cathie Olden RN

## 2012-07-30 ENCOUNTER — Encounter (HOSPITAL_COMMUNITY): Payer: Medicare Other

## 2012-07-31 ENCOUNTER — Encounter (HOSPITAL_COMMUNITY)
Admission: RE | Admit: 2012-07-31 | Discharge: 2012-07-31 | Disposition: A | Discharge status: Home / Self Care | Payer: Medicare Other | Source: Ambulatory Visit | Attending: Pulmonary Disease | Admitting: Pulmonary Disease

## 2012-07-31 NOTE — Progress Notes (Signed)
More relaxed with exercise today, says she feels better just getting out.   Tracie Harrier RN

## 2012-08-02 ENCOUNTER — Encounter (HOSPITAL_COMMUNITY)
Admission: RE | Admit: 2012-08-02 | Discharge: 2012-08-02 | Disposition: A | Discharge status: Home / Self Care | Payer: Medicare Other | Source: Ambulatory Visit | Attending: Pulmonary Disease | Admitting: Pulmonary Disease

## 2012-08-06 ENCOUNTER — Encounter (HOSPITAL_COMMUNITY): Payer: Medicare Other

## 2012-08-07 ENCOUNTER — Encounter (HOSPITAL_COMMUNITY)
Admission: RE | Admit: 2012-08-07 | Discharge: 2012-08-07 | Disposition: A | Discharge status: Home / Self Care | Payer: Medicare Other | Source: Ambulatory Visit | Attending: Pulmonary Disease | Admitting: Pulmonary Disease

## 2012-08-09 ENCOUNTER — Encounter (HOSPITAL_COMMUNITY)
Admission: RE | Admit: 2012-08-09 | Discharge: 2012-08-09 | Disposition: A | Discharge status: Home / Self Care | Payer: Medicare Other | Source: Ambulatory Visit | Attending: Pulmonary Disease | Admitting: Pulmonary Disease

## 2012-08-13 ENCOUNTER — Encounter (HOSPITAL_COMMUNITY): Payer: Medicare Other

## 2012-08-14 ENCOUNTER — Encounter (HOSPITAL_COMMUNITY)
Admission: RE | Admit: 2012-08-14 | Discharge: 2012-08-14 | Disposition: A | Discharge status: Home / Self Care | Payer: Medicare Other | Source: Ambulatory Visit | Attending: Pulmonary Disease | Admitting: Pulmonary Disease

## 2012-08-16 ENCOUNTER — Encounter (HOSPITAL_COMMUNITY)
Admission: RE | Admit: 2012-08-16 | Discharge: 2012-08-16 | Disposition: A | Discharge status: Home / Self Care | Payer: Medicare Other | Source: Ambulatory Visit | Attending: Pulmonary Disease | Admitting: Pulmonary Disease

## 2012-08-20 ENCOUNTER — Encounter (HOSPITAL_COMMUNITY): Payer: Medicare Other

## 2012-08-21 ENCOUNTER — Encounter (HOSPITAL_COMMUNITY): Payer: Medicare Other

## 2012-08-23 ENCOUNTER — Encounter (HOSPITAL_COMMUNITY)
Admission: RE | Admit: 2012-08-23 | Discharge: 2012-08-23 | Disposition: A | Discharge status: Home / Self Care | Payer: Medicare Other | Source: Ambulatory Visit | Attending: Pulmonary Disease | Admitting: Pulmonary Disease

## 2012-08-27 ENCOUNTER — Encounter (HOSPITAL_COMMUNITY): Payer: Medicare Other

## 2012-08-28 ENCOUNTER — Encounter (HOSPITAL_COMMUNITY)
Admission: RE | Admit: 2012-08-28 | Discharge: 2012-08-28 | Disposition: A | Discharge status: Home / Self Care | Payer: Medicare Other | Source: Ambulatory Visit | Attending: Pulmonary Disease | Admitting: Pulmonary Disease

## 2012-08-28 DIAGNOSIS — J438 Other emphysema: Secondary | ICD-10-CM | POA: Insufficient documentation

## 2012-08-28 DIAGNOSIS — Z5189 Encounter for other specified aftercare: Secondary | ICD-10-CM | POA: Insufficient documentation

## 2012-08-28 NOTE — Progress Notes (Signed)
[id number] Reviewed home exercise guidelines with patient including endpoints, temperature precautions, oxygen saturation, rest breaks, dyspnea and rate of perceived exertion. Pt plans to walk as her mode of home exercise.Pt's long term goal is to be able to work again. Pt voices understanding of instructions given.  Cristy Hilts, MS, ACSM CES

## 2012-08-30 ENCOUNTER — Encounter (HOSPITAL_COMMUNITY): Payer: Medicare Other

## 2012-08-30 ENCOUNTER — Telehealth: Payer: Self-pay | Admitting: Family Medicine

## 2012-08-30 MED ORDER — BUPROPION HCL ER (SR) 150 MG PO TB12: 150.0000 mg | ORAL_TABLET | Freq: Two times a day (BID) | ORAL | Status: DC

## 2012-08-30 NOTE — Addendum Note (Signed)
Addended by: Azucena Freed on: 08/30/2012 04:08 PM   Modules accepted: Orders

## 2012-08-30 NOTE — Telephone Encounter (Signed)
Pls advise.  

## 2012-08-30 NOTE — Telephone Encounter (Signed)
Ok to refill for 1 month, looks like we have not seen her for her depression in some time - advise appt for further refills.

## 2012-08-30 NOTE — Telephone Encounter (Signed)
Pt needs refill of: buPROPion (WELLBUTRIN SR) 150 MG 12 hr tablet Pharm/[facility]/ [address]

## 2012-08-30 NOTE — Telephone Encounter (Signed)
Rx sent to pharmacy and noted about appt.

## 2012-09-03 ENCOUNTER — Encounter (HOSPITAL_COMMUNITY): Payer: Medicare Other

## 2012-09-04 ENCOUNTER — Encounter (HOSPITAL_COMMUNITY)
Admission: RE | Admit: 2012-09-04 | Discharge: 2012-09-04 | Disposition: A | Discharge status: Home / Self Care | Payer: Medicare Other | Source: Ambulatory Visit | Attending: Pulmonary Disease | Admitting: Pulmonary Disease

## 2012-09-04 ENCOUNTER — Encounter (HOSPITAL_COMMUNITY): Payer: Medicare Other

## 2012-09-06 ENCOUNTER — Encounter (HOSPITAL_COMMUNITY): Payer: Medicare Other

## 2012-09-06 ENCOUNTER — Encounter (HOSPITAL_COMMUNITY)
Admission: RE | Admit: 2012-09-06 | Discharge: 2012-09-06 | Disposition: A | Discharge status: Home / Self Care | Payer: Medicare Other | Source: Ambulatory Visit | Attending: Pulmonary Disease | Admitting: Pulmonary Disease

## 2012-09-10 ENCOUNTER — Other Ambulatory Visit (HOSPITAL_COMMUNITY)
Admission: RE | Admit: 2012-09-10 | Discharge: 2012-09-10 | Disposition: A | Discharge status: Home / Self Care | Payer: Medicare Other | Source: Ambulatory Visit | Attending: Family Medicine | Admitting: Family Medicine

## 2012-09-10 ENCOUNTER — Encounter (HOSPITAL_COMMUNITY): Payer: Medicare Other

## 2012-09-10 ENCOUNTER — Encounter: Payer: Self-pay | Admitting: Family Medicine

## 2012-09-10 ENCOUNTER — Ambulatory Visit (INDEPENDENT_AMBULATORY_CARE_PROVIDER_SITE_OTHER): Payer: Medicare Other | Admitting: Family Medicine

## 2012-09-10 VITALS — BP 118/80 | Temp 98.7°F | Wt 126.0 lb

## 2012-09-10 DIAGNOSIS — N760 Acute vaginitis: Secondary | ICD-10-CM | POA: Insufficient documentation

## 2012-09-10 DIAGNOSIS — N898 Other specified noninflammatory disorders of vagina: Secondary | ICD-10-CM

## 2012-09-10 DIAGNOSIS — J438 Other emphysema: Secondary | ICD-10-CM

## 2012-09-10 DIAGNOSIS — Z0000 Encounter for general adult medical examination without abnormal findings: Secondary | ICD-10-CM

## 2012-09-10 DIAGNOSIS — Z01419 Encounter for gynecological examination (general) (routine) without abnormal findings: Secondary | ICD-10-CM | POA: Insufficient documentation

## 2012-09-10 DIAGNOSIS — F32A Depression, unspecified: Secondary | ICD-10-CM

## 2012-09-10 DIAGNOSIS — L309 Dermatitis, unspecified: Secondary | ICD-10-CM

## 2012-09-10 DIAGNOSIS — Z113 Encounter for screening for infections with a predominantly sexual mode of transmission: Secondary | ICD-10-CM | POA: Insufficient documentation

## 2012-09-10 DIAGNOSIS — L259 Unspecified contact dermatitis, unspecified cause: Secondary | ICD-10-CM

## 2012-09-10 DIAGNOSIS — Z1151 Encounter for screening for human papillomavirus (HPV): Secondary | ICD-10-CM | POA: Insufficient documentation

## 2012-09-10 DIAGNOSIS — J439 Emphysema, unspecified: Secondary | ICD-10-CM

## 2012-09-10 DIAGNOSIS — F329 Major depressive disorder, single episode, unspecified: Secondary | ICD-10-CM

## 2012-09-10 MED ORDER — TRIAMCINOLONE ACETONIDE 0.025 % EX CREA: TOPICAL_CREAM | Freq: Two times a day (BID) | CUTANEOUS | Status: DC

## 2012-09-10 NOTE — Patient Instructions (Signed)
-  cerave cream or cetaphil restoraderm cream/lotion on legs daily, triamcinolone twice daily for 1 week - if persist see dermatologist  -see dermatologist once yearly for skin exam given your family history and about lesion on scalp  -We have ordered labs or studies at this visit. It can take up to 1-2 weeks for results and processing. We will contact you with instructions IF your results are abnormal. Normal results will be released to your [facility]. If you have not heard from [name] or can not find your results in [facility] in 2 weeks please contact our office.  -PLEASE SIGN UP FOR MYCHART TODAY   We recommend the following healthy lifestyle measures: - eat a healthy diet consisting of lots of vegetables, fruits, beans, nuts, seeds, healthy meats such as white chicken and fish and whole grains.  - avoid fried foods, fast food, processed foods, sodas, red meet and other fattening foods.  - get a least 150 minutes of aerobic exercise per week.   Follow up in: 6 months or sooner if concerns

## 2012-09-10 NOTE — Progress Notes (Signed)
Chief Complaint  Patient presents with  . Annual Exam    HPI:  Here for CPE:  1)Depression: -stable, has been on wellbutrin for some time -her depression has improved with exercise in pulmonary rehab  2)Emphysema: -she feels she is dong much better -is very happy with the pulmonary rehab -Concerns today: none  3)dry skin on legs, mole on head -thinks mole on head - had physician look at it 1 year ago and told fine, seems maybe larger -brother died of melanoma  -Diet: variety of foods, balance and well rounded, cutting back on sodium  -Vitamin D and calcium: takes occ  -Exercise: regular exercise - 4 x per week with pulm rehab  -Diabetes and Dyslipidemia Screening: done in Jan  -Hx of HTN: no  -Vaccines: UTD on pneumovax, gets flu vaccine annually, can't remember last tetanus - thinks has been a long time  -pap history: last pap 2010, per report normal  -FDLMP: postmenopausal, no bleeding  -sexual activity: yes, female partner, no new partners  -wants STI testing: no, refused Hep C  -FH breast, colon or ovarian ca: see FH Had colonoscopy 3/14, recs to repeat in 5 years due to polyp Had mammo 2/14 -Alcohol, Tobacco, drug use: see social history  Review of Systems - SOB has improved, denies CP, palpitations, bleeding, bowel changes, urinary symptoms, skin lesions other then dry skin on legs and itching around scar on L lower leg, or rashes, depression, falls  Past Medical History  Diagnosis Date  . Depression   . Emphysema   . Seasonal allergies     Family History  Problem Relation Age of Onset  . Alcohol abuse Mother   . Uterine cancer Mother   . Emphysema Mother   . Alcohol abuse Father   . Lung cancer Father   . Hypertension Father   . Emphysema Father   . Melanoma Brother   . Colon cancer Neg Hx   . Esophageal cancer Neg Hx   . Rectal cancer Neg Hx   . Stomach cancer Neg Hx     History   Social History  . Marital Status: Divorced    Spouse  Name: N/A    Number of Children: N/A  . Years of Education: N/A   Occupational History  . artists    Social History Main Topics  . Smoking status: Former Smoker -- 2.00 packs/day for 35 years    Types: Cigarettes    Quit date: 03/28/2006  . Smokeless tobacco: Never Used  . Alcohol Use: No  . Drug Use: No  . Sexually Active: No   Other Topics Concern  . 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       Home Situation: lives alone      Spiritual Beliefs: episcopalian      Lifestyle: no regular exercise, diet is ok                Current outpatient prescriptions:acyclovir (ZOVIRAX) 200 MG capsule, Take by mouth as needed., Disp: , Rfl: ;  albuterol (PROVENTIL HFA;VENTOLIN HFA) 108 (90 BASE) MCG/ACT inhaler, Inhale 2 puffs into the lungs every 6 (six) hours as needed., Disp: , Rfl: ;  buPROPion (WELLBUTRIN SR) 150 MG 12 hr tablet, Take 1 tablet (150 mg total) by mouth 2 (two) times daily., Disp: 60 tablet, Rfl: 0 Fluticasone-Salmeterol (ADVAIR) 250-50 MCG/DOSE AEPB, Inhale 1 puff into the lungs every 12 (twelve)  hours., Disp: 60 each, Rfl: 6;  ibuprofen (ADVIL,MOTRIN) 200 MG tablet, Take 200 mg by mouth every 6 (six) hours as needed., Disp: , Rfl: ;  MELATONIN PO, Take by mouth., Disp: , Rfl: ;  nystatin (MYCOSTATIN) 100000 UNIT/ML suspension, Take 5 mLs (500,000 Units total) by mouth 4 (four) times daily., Disp: 60 mL, Rfl: 0 triamcinolone (KENALOG) 0.025 % cream, Apply topically 2 (two) times daily., Disp: 30 g, Rfl: 0  EXAM:  Filed Vitals:   09/10/12 1422  BP: 118/80  Temp: 98.7 F (37.1 C)    GENERAL: vitals reviewed and listed below, alert, oriented, appears well hydrated and in no acute distress  HEENT: head atraumatic, PERRLA, normal appearance of eyes, ears, nose and mouth. moist mucus membranes.  NECK: supple, no masses or lymphadenopathy  LUNGS: clear to auscultation bilaterally, no rales,  rhonchi or wheeze  CV: HRRR, no peripheral edema or cyanosis, normal pedal pulses  BREAST: refused  ABDOMEN: bowel sounds normal, soft, non tender to palpation, no masses, no rebound or guarding  GU: normal appearance of external genitalia - no lesions or masses, normal vaginal mucosa - whitish yellow vaginal discharge, normal appearance of cervix - no lesions or abnormal discharge, no masses or tenderness on palpation of uterus and ovaries.  RECTAL: refused  SKIN: no rash or abnormal lesions, dry skin on legs, flesh colored 1.2cm raised lesion on R temporal region - stuck on appearance  MS: normal gait, moves all extremities normally  NEURO: CN II-XII grossly intact, normal muscle strength and sensation to light touch on extremities  PSYCH: normal affect, pleasant and cooperative  ASSESSMENT AND PLAN:  Discussed the following assessment and plan:  Visit for preventive health examination - Plan: Cytology - PAP  Eczema - Plan: triamcinolone (KENALOG) 0.025 % cream  Depression  COPD with emphysema  Vaginal discharge   -Discussed and advised all [name] preventive services health task force level A and B recommendations for age, sex and risks.  -Advised at least 150 minutes of exercise per week and a healthy diet low in saturated fats and sweets and consisting of fresh fruits and vegetables, lean meats such as fish and white chicken and whole grains.  -labs, studies and vaccines per orders this encounter  -tdap today  -for skin rash - cerave or cetaphil and hydrocortisone, return if does not heal  No orders of the defined types were placed in this encounter.    Patient Instructions  -cerave cream or cetaphil restoraderm cream/lotion on legs daily, triamcinolone twice daily for 1 week - if persist see dermatologist  -see dermatologist once yearly for skin exam given your family history and about lesion on scalp  -We have ordered labs or studies at this visit. It can take  up to 1-2 weeks for results and processing. We will contact you with instructions IF your results are abnormal. Normal results will be released to your [facility]. If you have not heard from [name] or can not find your results in [facility] in 2 weeks please contact our office.  -PLEASE SIGN UP FOR MYCHART TODAY   We recommend the following healthy lifestyle measures: - eat a healthy diet consisting of lots of vegetables, fruits, beans, nuts, seeds, healthy meats such as white chicken and fish and whole grains.  - avoid fried foods, fast food, processed foods, sodas, red meet and other fattening foods.  - get a least 150 minutes of aerobic exercise per week.   Follow up in: 6 months or sooner if  concerns      Patient advised to return to clinic immediately if symptoms worsen or persist or new concerns.   Return in about 6 months (around 03/12/2013) for follow up.  Kriste Basque R.

## 2012-09-11 ENCOUNTER — Encounter (HOSPITAL_COMMUNITY): Payer: Medicare Other

## 2012-09-13 ENCOUNTER — Encounter (HOSPITAL_COMMUNITY): Payer: Medicare Other

## 2012-09-13 ENCOUNTER — Encounter (HOSPITAL_COMMUNITY)
Admission: RE | Admit: 2012-09-13 | Discharge: 2012-09-13 | Disposition: A | Discharge status: Home / Self Care | Payer: Medicare Other | Source: Ambulatory Visit | Attending: Pulmonary Disease | Admitting: Pulmonary Disease

## 2012-09-13 NOTE — Progress Notes (Signed)
Quick Note:  Called and left a message for pt at designated cell phone number. ______

## 2012-09-13 NOTE — Progress Notes (Signed)
Quick Note:  Attempted to call pt; pt not available at this time. ______

## 2012-09-14 MED ORDER — METRONIDAZOLE 500 MG PO TABS: 500.0000 mg | ORAL_TABLET | Freq: Two times a day (BID) | ORAL | Status: DC

## 2012-09-14 MED ORDER — BUPROPION HCL ER (SR) 150 MG PO TB12: 150.0000 mg | ORAL_TABLET | Freq: Two times a day (BID) | ORAL | Status: DC

## 2012-09-14 NOTE — Addendum Note (Signed)
Addended by: Azucena Freed on: 09/14/2012 10:49 AM   Modules accepted: Orders

## 2012-09-14 NOTE — Progress Notes (Signed)
Quick Note:  Called and spoke with pt and pt is aware. Rx sent to pharmacy. ______ 

## 2012-09-17 ENCOUNTER — Encounter (HOSPITAL_COMMUNITY): Payer: Medicare Other

## 2012-09-18 ENCOUNTER — Encounter (HOSPITAL_COMMUNITY): Payer: Medicare Other

## 2012-09-18 ENCOUNTER — Encounter (HOSPITAL_COMMUNITY)
Admission: RE | Admit: 2012-09-18 | Discharge: 2012-09-18 | Disposition: A | Discharge status: Home / Self Care | Payer: Medicare Other | Source: Ambulatory Visit | Attending: Pulmonary Disease | Admitting: Pulmonary Disease

## 2012-09-20 ENCOUNTER — Encounter (HOSPITAL_COMMUNITY): Payer: Medicare Other

## 2012-09-20 ENCOUNTER — Encounter (HOSPITAL_COMMUNITY)
Admission: RE | Admit: 2012-09-20 | Discharge: 2012-09-20 | Disposition: A | Discharge status: Home / Self Care | Payer: Medicare Other | Source: Ambulatory Visit | Attending: Pulmonary Disease | Admitting: Pulmonary Disease

## 2012-09-24 ENCOUNTER — Encounter (HOSPITAL_COMMUNITY): Payer: Medicare Other

## 2012-09-25 ENCOUNTER — Encounter (HOSPITAL_COMMUNITY): Payer: Medicare Other

## 2012-09-25 ENCOUNTER — Encounter (HOSPITAL_COMMUNITY)
Admission: RE | Admit: 2012-09-25 | Discharge: 2012-09-25 | Disposition: A | Discharge status: Home / Self Care | Payer: Medicare Other | Source: Ambulatory Visit | Attending: Pulmonary Disease | Admitting: Pulmonary Disease

## 2012-09-25 DIAGNOSIS — J438 Other emphysema: Secondary | ICD-10-CM | POA: Insufficient documentation

## 2012-09-25 DIAGNOSIS — Z5189 Encounter for other specified aftercare: Secondary | ICD-10-CM | POA: Insufficient documentation

## 2012-09-27 ENCOUNTER — Encounter (HOSPITAL_COMMUNITY)
Admission: RE | Admit: 2012-09-27 | Discharge: 2012-09-27 | Disposition: A | Discharge status: Home / Self Care | Payer: Medicare Other | Source: Ambulatory Visit | Attending: Pulmonary Disease | Admitting: Pulmonary Disease

## 2012-09-27 ENCOUNTER — Encounter (HOSPITAL_COMMUNITY): Payer: Medicare Other

## 2012-10-01 ENCOUNTER — Encounter (HOSPITAL_COMMUNITY): Payer: Medicare Other

## 2012-10-02 ENCOUNTER — Encounter (HOSPITAL_COMMUNITY): Payer: Medicare Other

## 2012-10-02 ENCOUNTER — Encounter (HOSPITAL_COMMUNITY)
Admission: RE | Admit: 2012-10-02 | Discharge: 2012-10-02 | Disposition: A | Discharge status: Home / Self Care | Payer: Medicare Other | Source: Ambulatory Visit | Attending: Pulmonary Disease | Admitting: Pulmonary Disease

## 2012-10-04 ENCOUNTER — Encounter (HOSPITAL_COMMUNITY)
Admission: RE | Admit: 2012-10-04 | Discharge: 2012-10-04 | Disposition: A | Discharge status: Home / Self Care | Payer: Medicare Other | Source: Ambulatory Visit | Attending: Pulmonary Disease | Admitting: Pulmonary Disease

## 2012-10-09 ENCOUNTER — Encounter (HOSPITAL_COMMUNITY): Payer: Medicare Other

## 2012-10-11 ENCOUNTER — Encounter (HOSPITAL_COMMUNITY)
Admission: RE | Admit: 2012-10-11 | Discharge: 2012-10-11 | Disposition: A | Discharge status: Home / Self Care | Payer: Medicare Other | Source: Ambulatory Visit | Attending: Pulmonary Disease | Admitting: Pulmonary Disease

## 2012-10-16 ENCOUNTER — Encounter (HOSPITAL_COMMUNITY)
Admission: RE | Admit: 2012-10-16 | Discharge: 2012-10-16 | Disposition: A | Discharge status: Home / Self Care | Payer: Medicare Other | Source: Ambulatory Visit | Attending: Pulmonary Disease | Admitting: Pulmonary Disease

## 2012-10-18 ENCOUNTER — Encounter (HOSPITAL_COMMUNITY)
Admission: RE | Admit: 2012-10-18 | Discharge: 2012-10-18 | Disposition: A | Discharge status: Home / Self Care | Payer: Medicare Other | Source: Ambulatory Visit | Attending: Pulmonary Disease | Admitting: Pulmonary Disease

## 2012-10-23 ENCOUNTER — Encounter (HOSPITAL_COMMUNITY)
Admission: RE | Admit: 2012-10-23 | Discharge: 2012-10-23 | Disposition: A | Discharge status: Home / Self Care | Payer: Medicare Other | Source: Ambulatory Visit | Attending: Pulmonary Disease | Admitting: Pulmonary Disease

## 2012-10-25 ENCOUNTER — Encounter (HOSPITAL_COMMUNITY): Payer: Medicare Other

## 2012-10-30 ENCOUNTER — Encounter (HOSPITAL_COMMUNITY)
Admission: RE | Admit: 2012-10-30 | Discharge: 2012-10-30 | Disposition: A | Discharge status: Home / Self Care | Payer: Self-pay | Source: Ambulatory Visit | Attending: Pulmonary Disease | Admitting: Pulmonary Disease

## 2012-10-30 DIAGNOSIS — Z5189 Encounter for other specified aftercare: Secondary | ICD-10-CM | POA: Insufficient documentation

## 2012-10-30 DIAGNOSIS — J438 Other emphysema: Secondary | ICD-10-CM | POA: Insufficient documentation

## 2012-11-01 ENCOUNTER — Encounter (HOSPITAL_COMMUNITY): Admission: RE | Admit: 2012-11-01 | Payer: Self-pay | Source: Ambulatory Visit

## 2012-11-06 ENCOUNTER — Encounter (HOSPITAL_COMMUNITY)
Admission: RE | Admit: 2012-11-06 | Discharge: 2012-11-06 | Disposition: A | Discharge status: Home / Self Care | Payer: Self-pay | Source: Ambulatory Visit | Attending: Pulmonary Disease | Admitting: Pulmonary Disease

## 2012-11-08 ENCOUNTER — Encounter (HOSPITAL_COMMUNITY)
Admission: RE | Admit: 2012-11-08 | Discharge: 2012-11-08 | Disposition: A | Discharge status: Home / Self Care | Payer: Self-pay | Source: Ambulatory Visit | Attending: Pulmonary Disease | Admitting: Pulmonary Disease

## 2012-11-13 ENCOUNTER — Encounter (HOSPITAL_COMMUNITY)
Admission: RE | Admit: 2012-11-13 | Discharge: 2012-11-13 | Disposition: A | Discharge status: Home / Self Care | Payer: Self-pay | Source: Ambulatory Visit | Attending: Pulmonary Disease | Admitting: Pulmonary Disease

## 2012-11-15 ENCOUNTER — Encounter (HOSPITAL_COMMUNITY): Payer: Self-pay

## 2012-11-20 ENCOUNTER — Encounter (HOSPITAL_COMMUNITY)
Admission: RE | Admit: 2012-11-20 | Discharge: 2012-11-20 | Disposition: A | Discharge status: Home / Self Care | Payer: Self-pay | Source: Ambulatory Visit | Attending: Pulmonary Disease | Admitting: Pulmonary Disease

## 2012-11-22 ENCOUNTER — Encounter (HOSPITAL_COMMUNITY)
Admission: RE | Admit: 2012-11-22 | Discharge: 2012-11-22 | Disposition: A | Discharge status: Home / Self Care | Payer: Self-pay | Source: Ambulatory Visit | Attending: Pulmonary Disease | Admitting: Pulmonary Disease

## 2012-11-27 ENCOUNTER — Encounter (HOSPITAL_COMMUNITY): Payer: Medicare Other

## 2012-11-27 DIAGNOSIS — Z5189 Encounter for other specified aftercare: Secondary | ICD-10-CM | POA: Insufficient documentation

## 2012-11-27 DIAGNOSIS — J438 Other emphysema: Secondary | ICD-10-CM | POA: Insufficient documentation

## 2012-11-29 ENCOUNTER — Encounter (HOSPITAL_COMMUNITY)
Admission: RE | Admit: 2012-11-29 | Discharge: 2012-11-29 | Disposition: A | Discharge status: Home / Self Care | Payer: Medicare Other | Source: Ambulatory Visit | Attending: Pulmonary Disease | Admitting: Pulmonary Disease

## 2012-12-03 ENCOUNTER — Other Ambulatory Visit: Payer: Self-pay | Admitting: Family Medicine

## 2012-12-03 NOTE — Telephone Encounter (Signed)
Brandy Russell, can you find out what she uses this for and how she uses and document in chart? If she is using if for recurrent herpes ok to refill.

## 2012-12-04 ENCOUNTER — Encounter (HOSPITAL_COMMUNITY)
Admission: RE | Admit: 2012-12-04 | Discharge: 2012-12-04 | Disposition: A | Discharge status: Home / Self Care | Payer: Medicare Other | Source: Ambulatory Visit | Attending: Pulmonary Disease | Admitting: Pulmonary Disease

## 2012-12-05 NOTE — Telephone Encounter (Signed)
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  Pt states she has a cold sore on her eye as well.  Rx sent to pharmacy.

## 2012-12-06 ENCOUNTER — Encounter (HOSPITAL_COMMUNITY)
Admission: RE | Admit: 2012-12-06 | Discharge: 2012-12-06 | Disposition: A | Discharge status: Home / Self Care | Payer: Medicare Other | Source: Ambulatory Visit | Attending: Pulmonary Disease | Admitting: Pulmonary Disease

## 2012-12-11 ENCOUNTER — Encounter (HOSPITAL_COMMUNITY)
Admission: RE | Admit: 2012-12-11 | Discharge: 2012-12-11 | Disposition: A | Discharge status: Home / Self Care | Payer: Medicare Other | Source: Ambulatory Visit | Attending: Pulmonary Disease | Admitting: Pulmonary Disease

## 2012-12-13 ENCOUNTER — Encounter (HOSPITAL_COMMUNITY)
Admission: RE | Admit: 2012-12-13 | Discharge: 2012-12-13 | Disposition: A | Discharge status: Home / Self Care | Payer: Medicare Other | Source: Ambulatory Visit | Attending: Pulmonary Disease | Admitting: Pulmonary Disease

## 2012-12-18 ENCOUNTER — Encounter (HOSPITAL_COMMUNITY)
Admission: RE | Admit: 2012-12-18 | Discharge: 2012-12-18 | Disposition: A | Discharge status: Home / Self Care | Payer: Medicare Other | Source: Ambulatory Visit | Attending: Pulmonary Disease | Admitting: Pulmonary Disease

## 2012-12-20 ENCOUNTER — Encounter (HOSPITAL_COMMUNITY)
Admission: RE | Admit: 2012-12-20 | Discharge: 2012-12-20 | Disposition: A | Discharge status: Home / Self Care | Payer: Medicare Other | Source: Ambulatory Visit | Attending: Pulmonary Disease | Admitting: Pulmonary Disease

## 2012-12-25 ENCOUNTER — Encounter (HOSPITAL_COMMUNITY)
Admission: RE | Admit: 2012-12-25 | Discharge: 2012-12-25 | Disposition: A | Discharge status: Home / Self Care | Payer: Medicare Other | Source: Ambulatory Visit | Attending: Pulmonary Disease | Admitting: Pulmonary Disease

## 2012-12-27 ENCOUNTER — Encounter (HOSPITAL_COMMUNITY)
Admission: RE | Admit: 2012-12-27 | Discharge: 2012-12-27 | Disposition: A | Discharge status: Home / Self Care | Payer: Self-pay | Source: Ambulatory Visit | Attending: Pulmonary Disease | Admitting: Pulmonary Disease

## 2012-12-27 DIAGNOSIS — Z5189 Encounter for other specified aftercare: Secondary | ICD-10-CM | POA: Insufficient documentation

## 2012-12-27 DIAGNOSIS — J438 Other emphysema: Secondary | ICD-10-CM | POA: Insufficient documentation

## 2013-01-01 ENCOUNTER — Encounter (HOSPITAL_COMMUNITY)
Admission: RE | Admit: 2013-01-01 | Discharge: 2013-01-01 | Disposition: A | Discharge status: Home / Self Care | Payer: Self-pay | Source: Ambulatory Visit | Attending: Pulmonary Disease | Admitting: Pulmonary Disease

## 2013-01-03 ENCOUNTER — Encounter (HOSPITAL_COMMUNITY): Payer: Medicare Other

## 2013-01-08 ENCOUNTER — Encounter (HOSPITAL_COMMUNITY)
Admission: RE | Admit: 2013-01-08 | Discharge: 2013-01-08 | Disposition: A | Discharge status: Home / Self Care | Payer: Self-pay | Source: Ambulatory Visit | Attending: Pulmonary Disease | Admitting: Pulmonary Disease

## 2013-01-09 ENCOUNTER — Encounter: Payer: Self-pay | Admitting: Pulmonary Disease

## 2013-01-09 ENCOUNTER — Ambulatory Visit (INDEPENDENT_AMBULATORY_CARE_PROVIDER_SITE_OTHER): Payer: Medicare Other | Admitting: Pulmonary Disease

## 2013-01-09 VITALS — BP 120/70 | HR 81 | Ht 67.0 in | Wt 123.0 lb

## 2013-01-09 DIAGNOSIS — J438 Other emphysema: Secondary | ICD-10-CM

## 2013-01-09 DIAGNOSIS — B370 Candidal stomatitis: Secondary | ICD-10-CM | POA: Insufficient documentation

## 2013-01-09 DIAGNOSIS — J439 Emphysema, unspecified: Secondary | ICD-10-CM

## 2013-01-09 DIAGNOSIS — Z23 Encounter for immunization: Secondary | ICD-10-CM

## 2013-01-09 MED ORDER — ALBUTEROL SULFATE HFA 108 (90 BASE) MCG/ACT IN AERS: 2.0000 | INHALATION_SPRAY | Freq: Four times a day (QID) | RESPIRATORY_TRACT | Status: DC | PRN

## 2013-01-09 NOTE — Patient Instructions (Signed)
Pneumonia shot today Follow up in 6 months

## 2013-01-09 NOTE — Progress Notes (Signed)
Chief Complaint  Patient presents with  . COPD    Breathing is unchanged. Currently in pulmonary rehab. Reports SOB, chest tightness. Denies coughing.    History of Present Illness: Brandy Russell is a 57 y.o. 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  She has been doing okay.  She has good days, and bad days.  She has enjoyed working in pulmonary rehab.  She is not having cough, or wheeze.  She denies leg swelling.  She was told to use bicarbonate rinse to limit thrush >> this helped.  Tests: Echo 02/08/05 >> EF 40 to 50% CT chest 04/04/09 >> apical centrilobular emphysema, 3 mm RUL nodule, 4 mm RUL nodule Spirometry 05/30/12 >> FEV1 0.69 (26%), FEV1% 40 A1AT 3.05/14 >> 112, PI*MS ONO with RA 07/03/12 >> Test time 8 hrs 33 min.  Mean SpO2 92.6%, low SpO2 87%.  Spent 8 min with SpO2 < 88%.  Brandy Russell  has a past medical history of Depression; Emphysema; and Seasonal allergies.  Brandy Russell  has past surgical history that includes Tonsillectomy and adenoidectomy (1962) and Dilation and curettage of uterus.  Prior to Admission medications   Medication Sig Start Date End Date Taking? Authorizing Provider  albuterol (PROVENTIL HFA;VENTOLIN HFA) 108 (90 BASE) MCG/ACT inhaler Inhale 2 puffs into the lungs every 6 (six) hours as needed.   Yes Historical Provider, MD  buPROPion (WELLBUTRIN SR) 150 MG 12 hr tablet Take 150 mg by mouth 2 (two) times daily.   Yes Historical Provider, MD  Fluticasone-Salmeterol (ADVAIR) 250-50 MCG/DOSE AEPB Inhale 1 puff into the lungs 2 (two) times daily. 06/14/11  Yes Rickard Patience, PA-C  ibuprofen (ADVIL,MOTRIN) 200 MG tablet Take 200 mg by mouth every 6 (six) hours as needed.   Yes Historical Provider, MD  MELATONIN PO Take by mouth.   Yes Historical Provider, MD    Allergies  Allergen Reactions  . Penicillins   . Sulfa Antibiotics     GI upset per the pt   Physical Exam:  General - No distress ENT - No sinus tenderness, no oral exudate, no  LAN Cardiac - s1s2 regular, no murmur Chest - decreased breath sounds, prolonged exhalation, no wheeze Back - No focal tenderness Abd - Soft, non-tender Ext - No edema Neuro - Normal strength Skin - No rashes Psych - Normal mood, and behavior  Assessment/Plan:  Brandy Helling, MD [place] [facility]/Critical Care/Sleep Pager:  [phone] 01/09/2013, 4:00 PM

## 2013-01-09 NOTE — Addendum Note (Signed)
Addended by: Caryl Ada on: 01/09/2013 04:22 PM   Modules accepted: Orders

## 2013-01-09 NOTE — Assessment & Plan Note (Signed)
Stable on her current inhaler regimen.  Discussed option of switching to LAMA/LABA combination in place of advair to limit problems with thrush >> she would like to continue with advair.

## 2013-01-09 NOTE — Assessment & Plan Note (Signed)
She reports improvement with bicarbonate mouth rinses.  Advised her to use tongue cleaner also.  She is to rinse mouth after using advair.

## 2013-01-10 ENCOUNTER — Encounter (HOSPITAL_COMMUNITY)
Admission: RE | Admit: 2013-01-10 | Discharge: 2013-01-10 | Disposition: A | Discharge status: Home / Self Care | Payer: Self-pay | Source: Ambulatory Visit | Attending: Pulmonary Disease | Admitting: Pulmonary Disease

## 2013-01-15 ENCOUNTER — Encounter (HOSPITAL_COMMUNITY)
Admission: RE | Admit: 2013-01-15 | Discharge: 2013-01-15 | Disposition: A | Discharge status: Home / Self Care | Payer: Self-pay | Source: Ambulatory Visit | Attending: Pulmonary Disease | Admitting: Pulmonary Disease

## 2013-01-17 ENCOUNTER — Encounter (HOSPITAL_COMMUNITY): Payer: Medicare Other

## 2013-01-22 ENCOUNTER — Encounter (HOSPITAL_COMMUNITY)
Admission: RE | Admit: 2013-01-22 | Discharge: 2013-01-22 | Disposition: A | Discharge status: Home / Self Care | Payer: Self-pay | Source: Ambulatory Visit | Attending: Pulmonary Disease | Admitting: Pulmonary Disease

## 2013-01-24 ENCOUNTER — Encounter (HOSPITAL_COMMUNITY)
Admission: RE | Admit: 2013-01-24 | Discharge: 2013-01-24 | Disposition: A | Discharge status: Home / Self Care | Payer: Self-pay | Source: Ambulatory Visit | Attending: Pulmonary Disease | Admitting: Pulmonary Disease

## 2013-01-29 ENCOUNTER — Encounter (HOSPITAL_COMMUNITY)
Admission: RE | Admit: 2013-01-29 | Discharge: 2013-01-29 | Disposition: A | Discharge status: Home / Self Care | Payer: Self-pay | Source: Ambulatory Visit | Attending: Pulmonary Disease | Admitting: Pulmonary Disease

## 2013-01-29 DIAGNOSIS — Z5189 Encounter for other specified aftercare: Secondary | ICD-10-CM | POA: Insufficient documentation

## 2013-01-29 DIAGNOSIS — J438 Other emphysema: Secondary | ICD-10-CM | POA: Insufficient documentation

## 2013-01-31 ENCOUNTER — Encounter (HOSPITAL_COMMUNITY): Payer: Medicare Other

## 2013-02-05 ENCOUNTER — Encounter (HOSPITAL_COMMUNITY)
Admission: RE | Admit: 2013-02-05 | Discharge: 2013-02-05 | Disposition: A | Discharge status: Home / Self Care | Payer: Self-pay | Source: Ambulatory Visit | Attending: Pulmonary Disease | Admitting: Pulmonary Disease

## 2013-02-07 ENCOUNTER — Encounter (HOSPITAL_COMMUNITY): Payer: Medicare Other

## 2013-02-12 ENCOUNTER — Encounter (HOSPITAL_COMMUNITY)
Admission: RE | Admit: 2013-02-12 | Discharge: 2013-02-12 | Disposition: A | Discharge status: Home / Self Care | Payer: Self-pay | Source: Ambulatory Visit | Attending: Pulmonary Disease | Admitting: Pulmonary Disease

## 2013-02-14 ENCOUNTER — Encounter (HOSPITAL_COMMUNITY)
Admission: RE | Admit: 2013-02-14 | Discharge: 2013-02-14 | Disposition: A | Discharge status: Home / Self Care | Payer: Self-pay | Source: Ambulatory Visit | Attending: Pulmonary Disease | Admitting: Pulmonary Disease

## 2013-02-19 ENCOUNTER — Encounter (HOSPITAL_COMMUNITY): Payer: Medicare Other

## 2013-02-21 ENCOUNTER — Encounter (HOSPITAL_COMMUNITY): Payer: Medicare Other

## 2013-02-26 ENCOUNTER — Encounter (HOSPITAL_COMMUNITY): Payer: Self-pay

## 2013-02-26 DIAGNOSIS — J438 Other emphysema: Secondary | ICD-10-CM | POA: Insufficient documentation

## 2013-02-26 DIAGNOSIS — Z5189 Encounter for other specified aftercare: Secondary | ICD-10-CM | POA: Insufficient documentation

## 2013-03-05 ENCOUNTER — Encounter (HOSPITAL_COMMUNITY)
Admission: RE | Admit: 2013-03-05 | Discharge: 2013-03-05 | Disposition: A | Discharge status: Home / Self Care | Payer: Self-pay | Source: Ambulatory Visit | Attending: Pulmonary Disease | Admitting: Pulmonary Disease

## 2013-03-06 ENCOUNTER — Other Ambulatory Visit: Payer: Self-pay | Admitting: Critical Care Medicine

## 2013-03-07 ENCOUNTER — Encounter (HOSPITAL_COMMUNITY)
Admission: RE | Admit: 2013-03-07 | Discharge: 2013-03-07 | Disposition: A | Discharge status: Home / Self Care | Payer: Self-pay | Source: Ambulatory Visit | Attending: Pulmonary Disease | Admitting: Pulmonary Disease

## 2013-03-12 ENCOUNTER — Encounter (HOSPITAL_COMMUNITY): Payer: Self-pay

## 2013-03-13 ENCOUNTER — Telehealth: Payer: Self-pay | Admitting: Family Medicine

## 2013-03-13 DIAGNOSIS — F410 Panic disorder [episodic paroxysmal anxiety] without agoraphobia: Secondary | ICD-10-CM

## 2013-03-13 NOTE — Telephone Encounter (Signed)
Pt will be flying 12/23- 12/27. Would like to know if you would rx her a xanax,valium or something in that family of meds, 5 tablets would be ideal. Pt also has anxiety w/ flying.  Pharm/[facility]/ [address]

## 2013-03-14 ENCOUNTER — Encounter (HOSPITAL_COMMUNITY)
Admission: RE | Admit: 2013-03-14 | Discharge: 2013-03-14 | Disposition: A | Discharge status: Home / Self Care | Payer: Self-pay | Source: Ambulatory Visit | Attending: Pulmonary Disease | Admitting: Pulmonary Disease

## 2013-03-14 MED ORDER — ALPRAZOLAM 0.25 MG PO TABS: 0.2500 mg | ORAL_TABLET | Freq: Two times a day (BID) | ORAL | Status: DC | PRN

## 2013-03-14 NOTE — Telephone Encounter (Signed)
Called xanax in to pharmacy.  Called and spoke with pt and pt is aware.

## 2013-03-14 NOTE — Telephone Encounter (Signed)
Ok for low dose to use only for flying associated anxiety. Looks like needs follow up at some point in next few months if not already scheduled.

## 2013-03-14 NOTE — Telephone Encounter (Signed)
i already put in rx for xanax

## 2013-03-14 NOTE — Telephone Encounter (Signed)
Which medicaiton? Xanax or Valium? Strength?

## 2013-03-19 ENCOUNTER — Encounter (HOSPITAL_COMMUNITY): Payer: Self-pay

## 2013-03-21 ENCOUNTER — Encounter (HOSPITAL_COMMUNITY): Payer: Self-pay

## 2013-03-26 ENCOUNTER — Encounter (HOSPITAL_COMMUNITY)
Admission: RE | Admit: 2013-03-26 | Discharge: 2013-03-26 | Disposition: A | Discharge status: Home / Self Care | Payer: Self-pay | Source: Ambulatory Visit | Attending: Pulmonary Disease | Admitting: Pulmonary Disease

## 2013-03-28 ENCOUNTER — Encounter (HOSPITAL_COMMUNITY): Payer: Self-pay

## 2013-03-28 ENCOUNTER — Other Ambulatory Visit: Payer: Self-pay | Admitting: Family Medicine

## 2013-03-28 DIAGNOSIS — Z5189 Encounter for other specified aftercare: Secondary | ICD-10-CM | POA: Insufficient documentation

## 2013-03-28 DIAGNOSIS — J438 Other emphysema: Secondary | ICD-10-CM | POA: Insufficient documentation

## 2013-04-02 ENCOUNTER — Encounter (HOSPITAL_COMMUNITY)
Admission: RE | Admit: 2013-04-02 | Discharge: 2013-04-02 | Disposition: A | Discharge status: Home / Self Care | Payer: Self-pay | Source: Ambulatory Visit | Attending: Pulmonary Disease | Admitting: Pulmonary Disease

## 2013-04-04 ENCOUNTER — Encounter (HOSPITAL_COMMUNITY)
Admission: RE | Admit: 2013-04-04 | Discharge: 2013-04-04 | Disposition: A | Discharge status: Home / Self Care | Payer: Self-pay | Source: Ambulatory Visit | Attending: Pulmonary Disease | Admitting: Pulmonary Disease

## 2013-04-08 ENCOUNTER — Telehealth: Payer: Self-pay | Admitting: Pulmonary Disease

## 2013-04-08 NOTE — Telephone Encounter (Signed)
Pt is aware samples of advair 250/50 are available at front desk for pick up. Nothing further is needed

## 2013-04-09 ENCOUNTER — Encounter (HOSPITAL_COMMUNITY)
Admission: RE | Admit: 2013-04-09 | Discharge: 2013-04-09 | Disposition: A | Discharge status: Home / Self Care | Payer: Self-pay | Source: Ambulatory Visit | Attending: Pulmonary Disease | Admitting: Pulmonary Disease

## 2013-04-11 ENCOUNTER — Encounter (HOSPITAL_COMMUNITY): Payer: Self-pay

## 2013-04-16 ENCOUNTER — Encounter (HOSPITAL_COMMUNITY)
Admission: RE | Admit: 2013-04-16 | Discharge: 2013-04-16 | Disposition: A | Discharge status: Home / Self Care | Payer: Self-pay | Source: Ambulatory Visit | Attending: Pulmonary Disease | Admitting: Pulmonary Disease

## 2013-04-18 ENCOUNTER — Encounter (HOSPITAL_COMMUNITY)
Admission: RE | Admit: 2013-04-18 | Discharge: 2013-04-18 | Disposition: A | Discharge status: Home / Self Care | Payer: Self-pay | Source: Ambulatory Visit | Attending: Pulmonary Disease | Admitting: Pulmonary Disease

## 2013-04-23 ENCOUNTER — Encounter (HOSPITAL_COMMUNITY): Admission: RE | Admit: 2013-04-23 | Payer: Self-pay | Source: Ambulatory Visit

## 2013-04-25 ENCOUNTER — Encounter (HOSPITAL_COMMUNITY)
Admission: RE | Admit: 2013-04-25 | Discharge: 2013-04-25 | Disposition: A | Discharge status: Home / Self Care | Payer: Self-pay | Source: Ambulatory Visit | Attending: Pulmonary Disease | Admitting: Pulmonary Disease

## 2013-04-30 ENCOUNTER — Encounter (HOSPITAL_COMMUNITY)
Admission: RE | Admit: 2013-04-30 | Discharge: 2013-04-30 | Disposition: A | Discharge status: Home / Self Care | Payer: Self-pay | Source: Ambulatory Visit | Attending: Pulmonary Disease | Admitting: Pulmonary Disease

## 2013-04-30 DIAGNOSIS — J438 Other emphysema: Secondary | ICD-10-CM | POA: Insufficient documentation

## 2013-04-30 DIAGNOSIS — Z5189 Encounter for other specified aftercare: Secondary | ICD-10-CM | POA: Insufficient documentation

## 2013-05-02 ENCOUNTER — Encounter (HOSPITAL_COMMUNITY): Payer: Self-pay

## 2013-05-07 ENCOUNTER — Encounter (HOSPITAL_COMMUNITY): Payer: Self-pay

## 2013-05-09 ENCOUNTER — Encounter (HOSPITAL_COMMUNITY)
Admission: RE | Admit: 2013-05-09 | Discharge: 2013-05-09 | Disposition: A | Discharge status: Home / Self Care | Payer: Self-pay | Source: Ambulatory Visit | Attending: Pulmonary Disease | Admitting: Pulmonary Disease

## 2013-05-14 ENCOUNTER — Encounter (HOSPITAL_COMMUNITY): Payer: Self-pay

## 2013-05-16 ENCOUNTER — Encounter (HOSPITAL_COMMUNITY)
Admission: RE | Admit: 2013-05-16 | Discharge: 2013-05-16 | Disposition: A | Discharge status: Home / Self Care | Payer: Self-pay | Source: Ambulatory Visit | Attending: Pulmonary Disease | Admitting: Pulmonary Disease

## 2013-05-21 ENCOUNTER — Encounter (HOSPITAL_COMMUNITY): Payer: Self-pay

## 2013-05-23 ENCOUNTER — Encounter (HOSPITAL_COMMUNITY): Payer: Self-pay

## 2013-05-28 ENCOUNTER — Encounter (HOSPITAL_COMMUNITY)
Admission: RE | Admit: 2013-05-28 | Discharge: 2013-05-28 | Disposition: A | Discharge status: Home / Self Care | Payer: Self-pay | Source: Ambulatory Visit | Attending: Pulmonary Disease | Admitting: Pulmonary Disease

## 2013-05-28 DIAGNOSIS — Z5189 Encounter for other specified aftercare: Secondary | ICD-10-CM | POA: Insufficient documentation

## 2013-05-28 DIAGNOSIS — J438 Other emphysema: Secondary | ICD-10-CM | POA: Insufficient documentation

## 2013-05-30 ENCOUNTER — Encounter (HOSPITAL_COMMUNITY): Payer: Self-pay

## 2013-06-04 ENCOUNTER — Encounter (HOSPITAL_COMMUNITY)
Admission: RE | Admit: 2013-06-04 | Discharge: 2013-06-04 | Disposition: A | Discharge status: Home / Self Care | Payer: Self-pay | Source: Ambulatory Visit | Attending: Pulmonary Disease | Admitting: Pulmonary Disease

## 2013-06-06 ENCOUNTER — Encounter (HOSPITAL_COMMUNITY)
Admission: RE | Admit: 2013-06-06 | Discharge: 2013-06-06 | Disposition: A | Discharge status: Home / Self Care | Payer: Self-pay | Source: Ambulatory Visit | Attending: Pulmonary Disease | Admitting: Pulmonary Disease

## 2013-06-11 ENCOUNTER — Encounter (HOSPITAL_COMMUNITY): Payer: Self-pay

## 2013-06-13 ENCOUNTER — Encounter (HOSPITAL_COMMUNITY): Payer: Self-pay

## 2013-06-18 ENCOUNTER — Encounter (HOSPITAL_COMMUNITY)
Admission: RE | Admit: 2013-06-18 | Discharge: 2013-06-18 | Disposition: A | Discharge status: Home / Self Care | Payer: Self-pay | Source: Ambulatory Visit | Attending: Pulmonary Disease | Admitting: Pulmonary Disease

## 2013-06-20 ENCOUNTER — Encounter (HOSPITAL_COMMUNITY)
Admission: RE | Admit: 2013-06-20 | Discharge: 2013-06-20 | Disposition: A | Discharge status: Home / Self Care | Payer: Self-pay | Source: Ambulatory Visit | Attending: Pulmonary Disease | Admitting: Pulmonary Disease

## 2013-06-25 ENCOUNTER — Encounter (HOSPITAL_COMMUNITY)
Admission: RE | Admit: 2013-06-25 | Discharge: 2013-06-25 | Disposition: A | Discharge status: Home / Self Care | Payer: Self-pay | Source: Ambulatory Visit | Attending: Pulmonary Disease | Admitting: Pulmonary Disease

## 2013-06-26 ENCOUNTER — Other Ambulatory Visit: Payer: Self-pay | Admitting: Family Medicine

## 2013-06-27 ENCOUNTER — Encounter (HOSPITAL_COMMUNITY): Payer: Medicare Other

## 2013-06-27 DIAGNOSIS — Z5189 Encounter for other specified aftercare: Secondary | ICD-10-CM | POA: Insufficient documentation

## 2013-06-27 DIAGNOSIS — J438 Other emphysema: Secondary | ICD-10-CM | POA: Insufficient documentation

## 2013-07-01 ENCOUNTER — Encounter: Payer: Self-pay | Admitting: Family Medicine

## 2013-07-01 ENCOUNTER — Ambulatory Visit (INDEPENDENT_AMBULATORY_CARE_PROVIDER_SITE_OTHER): Payer: Medicare Other | Admitting: Family Medicine

## 2013-07-01 VITALS — BP 122/78 | Temp 98.8°F | Wt 118.0 lb

## 2013-07-01 DIAGNOSIS — F32A Depression, unspecified: Secondary | ICD-10-CM

## 2013-07-01 DIAGNOSIS — J302 Other seasonal allergic rhinitis: Secondary | ICD-10-CM

## 2013-07-01 DIAGNOSIS — F329 Major depressive disorder, single episode, unspecified: Secondary | ICD-10-CM

## 2013-07-01 DIAGNOSIS — B009 Herpesviral infection, unspecified: Secondary | ICD-10-CM

## 2013-07-01 DIAGNOSIS — J438 Other emphysema: Secondary | ICD-10-CM

## 2013-07-01 DIAGNOSIS — J309 Allergic rhinitis, unspecified: Secondary | ICD-10-CM

## 2013-07-01 DIAGNOSIS — J439 Emphysema, unspecified: Secondary | ICD-10-CM

## 2013-07-01 DIAGNOSIS — F3289 Other specified depressive episodes: Secondary | ICD-10-CM

## 2013-07-01 MED ORDER — BUPROPION HCL ER (SR) 150 MG PO TB12: 150.0000 mg | ORAL_TABLET | Freq: Two times a day (BID) | ORAL | Status: DC

## 2013-07-01 MED ORDER — ACYCLOVIR 400 MG PO TABS: 400.0000 mg | ORAL_TABLET | Freq: Four times a day (QID) | ORAL | Status: DC

## 2013-07-01 NOTE — Progress Notes (Signed)
Pre visit review using our clinic review tool, if applicable. No additional management support is needed unless otherwise documented below in the visit note. 

## 2013-07-01 NOTE — Progress Notes (Addendum)
Chief Complaint  Patient presents with  . Follow-up    HPI:  Follow up:  Depression: -on wellbutrin -not seen in some time -denies: SI, worsening depression  Emphysema: -followed by Dr. Halford Chessman in pulm  Herpes simplex: -on episodic treatment, needs refill -denies current outbreak  Seasonal allergies: -stable -denies: fevers, sinus pain  HM: last yearly exam 08/2012 ROS: See pertinent positives and negatives per HPI.  Past Medical History  Diagnosis Date  . Depression   . Emphysema   . Seasonal allergies     Past Surgical History  Procedure Laterality Date  . Tonsillectomy and adenoidectomy  1962  . Dilation and curettage of uterus      Family History  Problem Relation Age of Onset  . Alcohol abuse Mother   . Uterine cancer Mother   . Emphysema Mother   . Alcohol abuse Father   . Lung cancer Father   . Hypertension Father   . Emphysema Father   . Melanoma Brother   . Colon cancer Neg Hx   . Esophageal cancer Neg Hx   . Rectal cancer Neg Hx   . Stomach cancer Neg Hx     History   Social History  . Marital Status: Divorced    Spouse Name: N/A    Number of Children: N/A  . Years of Education: N/A   Occupational History  . artists    Social History Main Topics  . Smoking status: Former Smoker -- 2.00 packs/day for 35 years    Types: Cigarettes    Quit date: 03/28/2006  . Smokeless tobacco: Never Used  . Alcohol Use: No  . Drug Use: No  . Sexual Activity: No   Other Topics Concern  . 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       Home Situation: lives alone      Spiritual Beliefs: episcopalian      Lifestyle: no regular exercise, diet is ok                Current outpatient prescriptions:acyclovir (ZOVIRAX) 400 MG tablet, Take 1 tablet (400 mg total) by mouth 4 (four) times daily., Disp: 60 tablet, Rfl: 0;  ADVAIR DISKUS 250-50 MCG/DOSE AEPB, USE 1 INHALATION  BY MOUTH TWICE DAILY, Disp: 60 each, Rfl: 6;  albuterol (PROAIR HFA) 108 (90 BASE) MCG/ACT inhaler, Inhale 2 puffs into the lungs every 6 (six) hours as needed for wheezing., Disp: 1 Inhaler, Rfl: 3 ALPRAZolam (XANAX) 0.25 MG tablet, Take 1 tablet (0.25 mg total) by mouth 2 (two) times daily as needed for anxiety., Disp: 5 tablet, Rfl: 0;  buPROPion (WELLBUTRIN SR) 150 MG 12 hr tablet, Take 1 tablet (150 mg total) by mouth 2 (two) times daily., Disp: 180 tablet, Rfl: 0;  ibuprofen (ADVIL,MOTRIN) 200 MG tablet, Take 200 mg by mouth every 6 (six) hours as needed., Disp: , Rfl: ;  MELATONIN PO, Take by mouth., Disp: , Rfl:  metroNIDAZOLE (FLAGYL) 500 MG tablet, Take 1 tablet (500 mg total) by mouth 2 (two) times daily., Disp: 14 tablet, Rfl: 0;  triamcinolone (KENALOG) 0.025 % cream, Apply topically 2 (two) times daily., Disp: 30 g, Rfl: 0  EXAM:  Filed Vitals:   07/01/13 1435  BP: 122/78  Temp: 98.8 F (37.1 C)    Body mass index is 18.48 kg/(m^2).  GENERAL: vitals reviewed and listed above, alert, oriented, appears well hydrated and in no acute distress  HEENT: atraumatic, conjunttiva clear, no obvious abnormalities on inspection of external nose and ears  NECK: no obvious masses on inspection  LUNGS: clear to auscultation bilaterally, no wheezes, rales or rhonchi, good air movement  CV: HRRR, no peripheral edema  MS: moves all extremities without noticeable abnormality  PSYCH: pleasant and cooperative, no obvious depression or anxiety  ASSESSMENT AND PLAN:  Discussed the following assessment and plan:  Herpes simplex - Plan: acyclovir (ZOVIRAX) 400 MG tablet  Depression - Plan: buPROPion (WELLBUTRIN SR) 150 MG 12 hr tablet  COPD with emphysema  Seasonal allergies  -stable -follow up for physical in June -Patient advised to return or notify a doctor immediately if symptoms worsen or persist or new concerns arise.  Patient Instructions  Follow up for physical     Colin Benton R.

## 2013-07-01 NOTE — Patient Instructions (Signed)
Follow up for physical.

## 2013-07-02 ENCOUNTER — Encounter (HOSPITAL_COMMUNITY)
Admission: RE | Admit: 2013-07-02 | Discharge: 2013-07-02 | Disposition: A | Discharge status: Home / Self Care | Payer: Self-pay | Source: Ambulatory Visit | Attending: Pulmonary Disease | Admitting: Pulmonary Disease

## 2013-07-04 ENCOUNTER — Encounter (HOSPITAL_COMMUNITY): Payer: Self-pay

## 2013-07-09 ENCOUNTER — Encounter (HOSPITAL_COMMUNITY)
Admission: RE | Admit: 2013-07-09 | Discharge: 2013-07-09 | Disposition: A | Discharge status: Home / Self Care | Payer: Self-pay | Source: Ambulatory Visit | Attending: Pulmonary Disease | Admitting: Pulmonary Disease

## 2013-07-10 ENCOUNTER — Ambulatory Visit (INDEPENDENT_AMBULATORY_CARE_PROVIDER_SITE_OTHER): Payer: Medicare Other | Admitting: Pulmonary Disease

## 2013-07-10 ENCOUNTER — Encounter: Payer: Self-pay | Admitting: Pulmonary Disease

## 2013-07-10 VITALS — BP 110/82 | HR 68 | Ht 67.0 in | Wt 119.0 lb

## 2013-07-10 DIAGNOSIS — J439 Emphysema, unspecified: Secondary | ICD-10-CM

## 2013-07-10 DIAGNOSIS — J438 Other emphysema: Secondary | ICD-10-CM

## 2013-07-10 NOTE — Patient Instructions (Signed)
Follow up in 6 months 

## 2013-07-10 NOTE — Assessment & Plan Note (Signed)
Stable on current inhaler regimen.  Encouraged her to keep up with her exercise regimen from pulmonary rehab.  Also encouraged her to keep up with her nutrition and maintain her weight.

## 2013-07-10 NOTE — Progress Notes (Signed)
Chief Complaint  Patient presents with  . COPD    Breathing has good days and bad days. Reports SOB. Denies chest tightness, wheezing or coughing.    History of Present Illness: Brandy Russell is a 58 y.o. 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  She has been doing well with pulmonary rehab.  She had to quit her next session >> she sold her house, and is planning a month long trip to [place], [place], and [place].  She is not having issues with thrush.  She is not having cough, wheeze, or sputum.  She denies chest pain, or leg swelling.  She is now able to walk for 15 minutes on a treadmill.  She uses advair bid.  She uses albuterol twice per week.   Tests: Echo 02/08/05 >> EF 40 to 50% CT chest 04/04/09 >> apical centrilobular emphysema, 3 mm RUL nodule, 4 mm RUL nodule Spirometry 05/30/12 >> FEV1 0.69 (26%), FEV1% 40 A1AT 05/30/12 >> 112, PI*MS ONO with RA 07/03/12 >> Test time 8 hrs 33 min.  Mean SpO2 92.6%, low SpO2 87%.  Spent 8 min with SpO2 < 88%.  Brandy Russell  has a past medical history of Depression; Emphysema; and Seasonal allergies.  Brandy Russell  has past surgical history that includes Tonsillectomy and adenoidectomy (1962) and Dilation and curettage of uterus.  Prior to Admission medications   Medication Sig Start Date End Date Taking? Authorizing Provider  albuterol (PROVENTIL HFA;VENTOLIN HFA) 108 (90 BASE) MCG/ACT inhaler Inhale 2 puffs into the lungs every 6 (six) hours as needed.   Yes Historical Provider, MD  buPROPion (WELLBUTRIN SR) 150 MG 12 hr tablet Take 150 mg by mouth 2 (two) times daily.   Yes Historical Provider, MD  Fluticasone-Salmeterol (ADVAIR) 250-50 MCG/DOSE AEPB Inhale 1 puff into the lungs 2 (two) times daily. 06/14/11  Yes Kemper Durie, PA-C  ibuprofen (ADVIL,MOTRIN) 200 MG tablet Take 200 mg by mouth every 6 (six) hours as needed.   Yes Historical Provider, MD  MELATONIN PO Take by mouth.   Yes Historical Provider, MD    Allergies   Allergen Reactions  . Penicillins   . Sulfa Antibiotics     GI upset per the pt   Physical Exam:  General - No distress, thin ENT - No sinus tenderness, no oral exudate, no LAN Cardiac - s1s2 regular, no murmur Chest - decreased breath sounds, prolonged exhalation, no wheeze Back - No focal tenderness Abd - Soft, non-tender Ext - No edema Neuro - Normal strength Skin - No rashes Psych - Normal mood, and behavior  Assessment/Plan:  Chesley Mires, MD [place] [facility]/Critical Care/Sleep Pager:  [phone] 07/10/2013, 10:18 AM

## 2013-07-11 ENCOUNTER — Encounter (HOSPITAL_COMMUNITY)
Admission: RE | Admit: 2013-07-11 | Discharge: 2013-07-11 | Disposition: A | Discharge status: Home / Self Care | Payer: Self-pay | Source: Ambulatory Visit | Attending: Pulmonary Disease | Admitting: Pulmonary Disease

## 2013-07-16 ENCOUNTER — Encounter (HOSPITAL_COMMUNITY): Payer: Self-pay

## 2013-07-18 ENCOUNTER — Encounter (HOSPITAL_COMMUNITY): Payer: Self-pay

## 2013-07-23 ENCOUNTER — Encounter (HOSPITAL_COMMUNITY): Payer: Self-pay

## 2013-07-25 ENCOUNTER — Encounter (HOSPITAL_COMMUNITY): Payer: Self-pay

## 2013-07-30 ENCOUNTER — Encounter (HOSPITAL_COMMUNITY): Payer: Self-pay

## 2013-08-01 ENCOUNTER — Encounter (HOSPITAL_COMMUNITY): Payer: Self-pay

## 2013-09-19 ENCOUNTER — Ambulatory Visit (INDEPENDENT_AMBULATORY_CARE_PROVIDER_SITE_OTHER): Payer: Medicare Other | Admitting: Family Medicine

## 2013-09-19 ENCOUNTER — Other Ambulatory Visit: Payer: Self-pay | Admitting: *Deleted

## 2013-09-19 ENCOUNTER — Ambulatory Visit: Payer: Medicare Other

## 2013-09-19 ENCOUNTER — Encounter: Payer: Self-pay | Admitting: Family Medicine

## 2013-09-19 VITALS — BP 104/70 | HR 73 | Temp 98.0°F | Ht 67.0 in | Wt 111.5 lb

## 2013-09-19 DIAGNOSIS — F3289 Other specified depressive episodes: Secondary | ICD-10-CM

## 2013-09-19 DIAGNOSIS — R61 Generalized hyperhidrosis: Secondary | ICD-10-CM

## 2013-09-19 DIAGNOSIS — R634 Abnormal weight loss: Secondary | ICD-10-CM

## 2013-09-19 DIAGNOSIS — F32A Depression, unspecified: Secondary | ICD-10-CM

## 2013-09-19 DIAGNOSIS — F329 Major depressive disorder, single episode, unspecified: Secondary | ICD-10-CM

## 2013-09-19 LAB — BASIC METABOLIC PANEL
BUN: 12 mg/dL (ref 6–23)
CALCIUM: 10.2 mg/dL (ref 8.4–10.5)
CO2: 29 mEq/L (ref 19–32)
CREATININE: 0.7 mg/dL (ref 0.4–1.2)
Chloride: 103 mEq/L (ref 96–112)
GFR: 86.64 mL/min (ref >60.00)
Glucose, Bld: 85 mg/dL (ref 70–99)
Potassium: 4.2 mEq/L (ref 3.5–5.1)
SODIUM: 139 meq/L (ref 135–145)

## 2013-09-19 LAB — CBC WITH DIFFERENTIAL/PLATELET
BASOS PCT: 0.5 % (ref 0.0–3.0)
Basophils Absolute: 0 10*3/uL (ref 0.0–0.1)
EOS PCT: 1.3 % (ref 0.0–5.0)
Eosinophils Absolute: 0.1 10*3/uL (ref 0.0–0.7)
HCT: 43.4 % (ref 36.0–46.0)
Hemoglobin: 14.4 g/dL (ref 12.0–15.0)
LYMPHS PCT: 36.1 % (ref 12.0–46.0)
Lymphs Abs: 2.3 10*3/uL (ref 0.7–4.0)
MCHC: 33.1 g/dL (ref 30.0–36.0)
MCV: 102.6 fl — ABNORMAL HIGH (ref 78.0–100.0)
MONOS PCT: 8.7 % (ref 3.0–12.0)
Monocytes Absolute: 0.6 10*3/uL (ref 0.1–1.0)
NEUTROS ABS: 3.5 10*3/uL (ref 1.4–7.7)
NEUTROS PCT: 53.4 % (ref 43.0–77.0)
Platelets: 334 10*3/uL (ref 150.0–400.0)
RBC: 4.23 Mil/uL (ref 3.87–5.11)
RDW: 13.3 % (ref 11.5–15.5)
WBC: 6.5 10*3/uL (ref 4.0–10.5)

## 2013-09-19 LAB — COMPREHENSIVE METABOLIC PANEL
ALBUMIN: 4.8 g/dL (ref 3.5–5.2)
ALK PHOS: 62 U/L (ref 39–117)
ALT: 14 U/L (ref 0–35)
AST: 23 U/L (ref 0–37)
BUN: 12 mg/dL (ref 6–23)
CHLORIDE: 103 meq/L (ref 96–112)
CO2: 29 mEq/L (ref 19–32)
CREATININE: 0.7 mg/dL (ref 0.4–1.2)
Calcium: 10.2 mg/dL (ref 8.4–10.5)
GFR: 86.64 mL/min (ref >60.00)
Glucose, Bld: 85 mg/dL (ref 70–99)
POTASSIUM: 4.2 meq/L (ref 3.5–5.1)
Sodium: 139 mEq/L (ref 135–145)
Total Bilirubin: 0.6 mg/dL (ref 0.2–1.2)
Total Protein: 8.2 g/dL (ref 6.0–8.3)

## 2013-09-19 LAB — LIPID PANEL
CHOL/HDL RATIO: 2
Cholesterol: 280 mg/dL — ABNORMAL HIGH (ref 0–200)
HDL: 141 mg/dL (ref >39.00)
LDL CALC: 127 mg/dL — AB (ref 0–99)
NONHDL: 139
Triglycerides: 62 mg/dL (ref 0.0–149.0)
VLDL: 12.4 mg/dL (ref 0.0–40.0)

## 2013-09-19 LAB — HEMOGLOBIN A1C: Hgb A1c MFr Bld: 5.5 % (ref 4.6–6.5)

## 2013-09-19 LAB — TSH: TSH: 1.13 u[IU]/mL (ref 0.35–4.50)

## 2013-09-19 MED ORDER — VENLAFAXINE HCL ER 75 MG PO CP24: 75.0000 mg | ORAL_CAPSULE | Freq: Every day | ORAL | Status: DC

## 2013-09-19 MED ORDER — VENLAFAXINE HCL ER 37.5 MG PO CP24: 37.5000 mg | ORAL_CAPSULE | Freq: Every day | ORAL | Status: DC

## 2013-09-19 NOTE — Progress Notes (Signed)
No chief complaint on file.   HPI:  Acute visit for  1) Hot Flashes: -reports hx early menopause on HRT 15 years in the past then stopped by physician treating at the time, stopped about 5 years ago and was doing ok -hx ? Abnormal heart scan when questioned about risk factors, abnormal scan in chart, but she reports told it may have been an old heart attack but she reports they then told her it may have been a glitch in the machine -hot flashes return with a vengence, started suddenly in the last 2 months when really stressed out  2)Depression: -worsening, has been under a lot of stress from selling her house -emotional, "grieving her house" -poor sleep, no SI, poor appetite, lost weight with this move and stress (15lbs in last 1 year), cries frequently, felt like last week she needs to schedule counseling, feels like wellbutrin not working   ROS: See pertinent positives and negatives per HPI.  Past Medical History  Diagnosis Date  . Depression   . Emphysema   . Seasonal allergies     Past Surgical History  Procedure Laterality Date  . Tonsillectomy and adenoidectomy  1962  . Dilation and curettage of uterus      Family History  Problem Relation Age of Onset  . Alcohol abuse Mother   . Uterine cancer Mother   . Emphysema Mother   . Alcohol abuse Father   . Lung cancer Father   . Hypertension Father   . Emphysema Father   . Melanoma Brother   . Colon cancer Neg Hx   . Esophageal cancer Neg Hx   . Rectal cancer Neg Hx   . Stomach cancer Neg Hx     History   Social History  . Marital Status: Divorced    Spouse Name: N/A    Number of Children: N/A  . Years of Education: N/A   Occupational History  . artists    Social History Main Topics  . Smoking status: Former Smoker -- 2.00 packs/day for 35 years    Types: Cigarettes    Quit date: 03/28/2006  . Smokeless tobacco: Never Used  . Alcohol Use: No  . Drug Use: No  . Sexual Activity: No   Other Topics  Concern  . 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       Home Situation: lives alone      Spiritual Beliefs: episcopalian      Lifestyle: no regular exercise, diet is ok                Current outpatient prescriptions:acyclovir (ZOVIRAX) 400 MG tablet, Take 1 tablet (400 mg total) by mouth 4 (four) times daily., Disp: 60 tablet, Rfl: 0;  ADVAIR DISKUS 250-50 MCG/DOSE AEPB, USE 1 INHALATION BY MOUTH TWICE DAILY, Disp: 60 each, Rfl: 6;  albuterol (PROAIR HFA) 108 (90 BASE) MCG/ACT inhaler, Inhale 2 puffs into the lungs every 6 (six) hours as needed for wheezing., Disp: 1 Inhaler, Rfl: 3 ibuprofen (ADVIL,MOTRIN) 200 MG tablet, Take 200 mg by mouth every 6 (six) hours as needed., Disp: , Rfl: ;  MELATONIN PO, Take by mouth., Disp: , Rfl: ;  venlafaxine XR (EFFEXOR XR) 37.5 MG 24 hr capsule, Take 1 capsule (37.5 mg total) by mouth daily with breakfast., Disp: 14 capsule, Rfl: 0;  venlafaxine XR (EFFEXOR XR) 75 MG 24 hr capsule, Take 1 capsule (75 mg  total) by mouth daily with breakfast., Disp: 30 capsule, Rfl: 3  EXAM:  Filed Vitals:   09/19/13 0959  BP: 104/70  Pulse: 73  Temp: 98 F (36.7 C)    Body mass index is 17.46 kg/(m^2).  GENERAL: vitals reviewed and listed above, alert, oriented, appears well hydrated and in no acute distress  HEENT: atraumatic, conjunttiva clear, no obvious abnormalities on inspection of external nose and ears  NECK: no obvious masses on inspection  LUNGS: clear to auscultation bilaterally, no wheezes, rales or rhonchi, good air movement  CV: HRRR, no peripheral edema  MS: moves all extremities without noticeable abnormality  PSYCH: pleasant and cooperative, no obvious depression or anxiety  ASSESSMENT AND PLAN:  Discussed the following assessment and plan:  Night sweats - Plan: TSH, Basic metabolic panel, CMP, Lipid Panel, Hemoglobin A1c, venlafaxine XR (EFFEXOR XR)  37.5 MG 24 hr capsule, venlafaxine XR (EFFEXOR XR) 75 MG 24 hr capsule  Depression - Plan: venlafaxine XR (EFFEXOR XR) 37.5 MG 24 hr capsule, venlafaxine XR (EFFEXOR XR) 75 MG 24 hr capsule  Loss of weight - Plan: TSH, Basic metabolic panel, CMP, Lipid Panel, Hemoglobin A1c  -we discussed possible serious and likely etiologies for weight loss and hot flashes, workup and treatment, treatment risks and return precautions -after this discussion, [place] opted for labs, change antidepressant to medication that can treat hot flashes, sleep and depression (SNRI) after risks discussed, close follow up -I discussed that I do not feel comfortable restarting HRT as had 15 year of it and ? Hx CAD - she was very upset about this, then agreeable after discussion to see specialist if labs ok and effexor not sufficient for the hot flashes -advised counseling  -of course, we advised [place]  to return or notify a doctor immediately if symptoms worsen or persist or new concerns arise.  -Patient advised to return or notify a doctor immediately if symptoms worsen or persist or new concerns arise.  Patient Instructions  STOP the well butrin  START EFFEXOR 37.5 mg daily for 1 week, then 75 mg daily thereafter - to stop go back to 37.5mg  for one week before stopping  -We have ordered labs or studies at this visit. It can take up to 1-2 weeks for results and processing. We will contact you with instructions IF your results are abnormal. Normal results will be released to your [facility]. If you have not heard from [name] or can not find your results in [facility] in 2 weeks please contact our office.  -follow up in 1 month as scheduled            Demauri Advincula R.

## 2013-09-19 NOTE — Addendum Note (Signed)
Addended by: Donnita Falls on: 09/19/2013 03:58 PM   Modules accepted: Orders

## 2013-09-19 NOTE — Progress Notes (Signed)
Pre visit review using our clinic review tool, if applicable. No additional management support is needed unless otherwise documented below in the visit note. 

## 2013-09-19 NOTE — Patient Instructions (Signed)
STOP the well butrin  START EFFEXOR 37.5 mg daily for 1 week, then 75 mg daily thereafter - to stop go back to 37.5mg  for one week before stopping  -We have ordered labs or studies at this visit. It can take up to 1-2 weeks for results and processing. We will contact you with instructions IF your results are abnormal. Normal results will be released to your [facility]. If you have not heard from [name] or can not find your results in [facility] in 2 weeks please contact our office.  -follow up in 1 month as scheduled

## 2013-09-19 NOTE — Addendum Note (Signed)
Addended by: Agnes Lawrence on: 09/19/2013 04:07 PM   Modules accepted: Orders

## 2013-10-13 ENCOUNTER — Other Ambulatory Visit: Payer: Self-pay | Admitting: Family Medicine

## 2013-10-14 NOTE — Telephone Encounter (Signed)
This medication was stopped at the last visit.

## 2013-10-18 ENCOUNTER — Encounter: Payer: Self-pay | Admitting: Family Medicine

## 2013-10-18 ENCOUNTER — Ambulatory Visit (INDEPENDENT_AMBULATORY_CARE_PROVIDER_SITE_OTHER): Payer: Medicare Other | Admitting: Family Medicine

## 2013-10-18 VITALS — BP 100/76 | HR 77 | Temp 97.7°F | Ht 66.25 in | Wt 115.0 lb

## 2013-10-18 DIAGNOSIS — N951 Menopausal and female climacteric states: Secondary | ICD-10-CM

## 2013-10-18 DIAGNOSIS — Z0000 Encounter for general adult medical examination without abnormal findings: Secondary | ICD-10-CM

## 2013-10-18 DIAGNOSIS — F32A Depression, unspecified: Secondary | ICD-10-CM

## 2013-10-18 DIAGNOSIS — F329 Major depressive disorder, single episode, unspecified: Secondary | ICD-10-CM

## 2013-10-18 DIAGNOSIS — R232 Flushing: Secondary | ICD-10-CM

## 2013-10-18 DIAGNOSIS — F3289 Other specified depressive episodes: Secondary | ICD-10-CM

## 2013-10-18 DIAGNOSIS — E785 Hyperlipidemia, unspecified: Secondary | ICD-10-CM

## 2013-10-18 MED ORDER — BUPROPION HCL ER (SR) 150 MG PO TB12: 150.0000 mg | ORAL_TABLET | Freq: Two times a day (BID) | ORAL | Status: DC

## 2013-10-18 NOTE — Patient Instructions (Signed)
-  schedule mammogram  -vitamin D3 1000 IU ([facility]); 1200mg  of calcium (from diet and supplement)  --We have ordered labs or studies at this visit. It can take up to 1-2 weeks for results and processing. We will contact you with instructions IF your results are abnormal. Normal results will be released to your [facility]. If you have not heard from [name] or can not find your results in [facility] in 2 weeks please contact our office.  -PLEASE SIGN UP FOR MYCHART TODAY   We recommend the following healthy lifestyle measures: - eat a healthy diet consisting of lots of vegetables, fruits, beans, nuts, seeds, healthy meats such as white chicken and fish and whole grains.  - avoid fried foods, fast food, processed foods, sodas, red meet and other fattening foods.  - get a least 150 minutes of aerobic exercise per week.   Follow up in: 6 months and as needed

## 2013-10-18 NOTE — Progress Notes (Signed)
No chief complaint on file.   HPI:  Here for CPE:  -Concerns and/or follow up today:   1 and 2)depression and hot flashes: -opted for trial of effexor for this, advised counseling which she refused -hx of HRT for 15 years in past, ? Hx CAD -labs including cbc with diff, tsh, bmp, cmp, hgba1c ok -reports: didn't like the effexor after reading side effect only took 2 doses but felt was numb, started wellbutrin 150 bid, feels like improved diet has really helped, working on support system, and relaxing -denies: intolerable hot flashes, thoughts of self harm -has gained 4 lbs  3)HLD: Lab Results  Component Value Date   CHOL 280* 09/19/2013   HDL 141.00 09/19/2013   LDLCALC 127* 09/19/2013   LDLDIRECT 102.8 04/16/2012   TRIG 62.0 09/19/2013   CHOLHDL 2 09/19/2013  -advised diet and exercise -Diet: variety of foods, balance and well rounded, larger portion sizes -Exercise: no regular exercise  -Taking folic acid, vitamin D or calcium: no  -Diabetes and Dyslipidemia Screening: done 08/2013  -Hx of HTN: no  -Vaccines: UTD  -pap history: normal 08/2012  -FDLMP: N/A  -sexual activity: yes, female partner, no new partners  -wants STI testing: no  -FH breast, colon or ovarian ca: see FH Last mammogram: > then 1 year per her report, does self breast exams Last colon cancer screening:  Last year, reports needed repeat in 5 years  -Alcohol, Tobacco, drug use: see social history  Review of Systems - no fevers, unintentional weight loss, vision loss, hearing loss, chest pain, sob, hemoptysis, melena, hematochezia, hematuria, genital discharge, changing or concerning skin lesions, bleeding, bruising, loc, thoughts of self harm or SI  Past Medical History  Diagnosis Date  . Depression   . Emphysema   . Seasonal allergies     Past Surgical History  Procedure Laterality Date  . Tonsillectomy and adenoidectomy  1962  . Dilation and curettage of uterus      Family History  Problem  Relation Age of Onset  . Alcohol abuse Mother   . Uterine cancer Mother   . Emphysema Mother   . Alcohol abuse Father   . Lung cancer Father   . Hypertension Father   . Emphysema Father   . Melanoma Brother   . Colon cancer Neg Hx   . Esophageal cancer Neg Hx   . Rectal cancer Neg Hx   . Stomach cancer Neg Hx     History   Social History  . Marital Status: Divorced    Spouse Name: N/A    Number of Children: N/A  . Years of Education: N/A   Occupational History  . artists    Social History Main Topics  . Smoking status: Former Smoker -- 2.00 packs/day for 35 years    Types: Cigarettes    Quit date: 03/28/2006  . Smokeless tobacco: Never Used  . Alcohol Use: No  . Drug Use: No  . Sexual Activity: No   Other Topics Concern  . 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       Home Situation: lives alone      Spiritual Beliefs: episcopalian      Lifestyle: no regular exercise, diet is ok                Current outpatient prescriptions:acyclovir (ZOVIRAX) 400 MG tablet, Take 1 tablet (400 mg total) by mouth 4 (four)  times daily., Disp: 60 tablet, Rfl: 0;  ADVAIR DISKUS 250-50 MCG/DOSE AEPB, USE 1 INHALATION BY MOUTH TWICE DAILY, Disp: 60 each, Rfl: 6;  albuterol (PROAIR HFA) 108 (90 BASE) MCG/ACT inhaler, Inhale 2 puffs into the lungs every 6 (six) hours as needed for wheezing., Disp: 1 Inhaler, Rfl: 3 ibuprofen (ADVIL,MOTRIN) 200 MG tablet, Take 200 mg by mouth every 6 (six) hours as needed., Disp: , Rfl: ;  MELATONIN PO, Take by mouth., Disp: , Rfl: ;  buPROPion (WELLBUTRIN SR) 150 MG 12 hr tablet, Take 1 tablet (150 mg total) by mouth 2 (two) times daily., Disp: 180 tablet, Rfl: 3  EXAM:  Filed Vitals:   10/18/13 1436  BP: 100/76  Pulse: 77  Temp: 97.7 F (36.5 C)  Body mass index is 18.42 kg/(m^2).   GENERAL: vitals reviewed and listed below, alert, oriented, appears well hydrated  and in no acute distress  HEENT: head atraumatic, PERRLA, normal appearance of eyes, ears, nose and mouth. moist mucus membranes.  NECK: supple, no masses or lymphadenopathy  LUNGS: clear to auscultation bilaterally, no rales, rhonchi or wheeze  CV: HRRR, no peripheral edema or cyanosis, normal pedal pulses  BREAST: declined  ABDOMEN: bowel sounds normal, soft, non tender to palpation, no masses, no rebound or guarding  GU: declined  RECTAL: declined  SKIN: no rash or abnormal lesions  MS: normal gait, moves all extremities normally  NEURO: CN II-XII grossly intact, normal muscle strength and sensation to light touch on extremities  PSYCH: normal affect, pleasant and cooperative  ASSESSMENT AND PLAN:  Discussed the following assessment and plan:  Visit for preventive health examination  Depression  Hot flashes  Hyperlipidemia  -Discussed and advised all [name] preventive services health task force level A and B recommendations for age, sex and risks.  -Advised at least 150 minutes of exercise per week and a healthy diet low in saturated fats and sweets and consisting of fresh fruits and vegetables, lean meats such as fish and white chicken and whole grains.  -gaining weight, doing much better, stress is better, hot flashes better, continue wellbutirn and if needed in future may do paxil  -studies and vaccines per orders this encounter  No orders of the defined types were placed in this encounter.    Patient Instructions  -schedule mammogram  -vitamin D3 1000 IU ([facility]); 1200mg  of calcium (from diet and supplement)  --We have ordered labs or studies at this visit. It can take up to 1-2 weeks for results and processing. We will contact you with instructions IF your results are abnormal. Normal results will be released to your [facility]. If you have not heard from [name] or can not find your results in [facility] in 2 weeks please contact our office.  -PLEASE SIGN UP FOR MYCHART  TODAY   We recommend the following healthy lifestyle measures: - eat a healthy diet consisting of lots of vegetables, fruits, beans, nuts, seeds, healthy meats such as white chicken and fish and whole grains.  - avoid fried foods, fast food, processed foods, sodas, red meet and other fattening foods.  - get a least 150 minutes of aerobic exercise per week.   Follow up in: 6 months and as needed      Patient advised to return to clinic immediately if symptoms worsen or persist or new concerns.    Return in about 6 months (around 04/20/2014) for follow up.  Colin Benton R.

## 2013-10-18 NOTE — Progress Notes (Signed)
Pre visit review using our clinic review tool, if applicable. No additional management support is needed unless otherwise documented below in the visit note. 

## 2013-12-11 ENCOUNTER — Other Ambulatory Visit: Payer: Self-pay | Admitting: Family Medicine

## 2013-12-12 ENCOUNTER — Ambulatory Visit: Payer: Medicare Other | Admitting: Family Medicine

## 2013-12-12 ENCOUNTER — Telehealth: Payer: Self-pay | Admitting: Family Medicine

## 2013-12-12 ENCOUNTER — Ambulatory Visit (INDEPENDENT_AMBULATORY_CARE_PROVIDER_SITE_OTHER): Payer: Medicare Other | Admitting: Family Medicine

## 2013-12-12 ENCOUNTER — Encounter: Payer: Self-pay | Admitting: Family Medicine

## 2013-12-12 VITALS — BP 110/74 | HR 81 | Temp 98.3°F | Ht 66.25 in | Wt 120.0 lb

## 2013-12-12 DIAGNOSIS — B009 Herpesviral infection, unspecified: Secondary | ICD-10-CM

## 2013-12-12 DIAGNOSIS — L299 Pruritus, unspecified: Secondary | ICD-10-CM

## 2013-12-12 DIAGNOSIS — B001 Herpesviral vesicular dermatitis: Secondary | ICD-10-CM

## 2013-12-12 MED ORDER — ACYCLOVIR 400 MG PO TABS: 400.0000 mg | ORAL_TABLET | Freq: Four times a day (QID) | ORAL | Status: DC

## 2013-12-12 NOTE — Telephone Encounter (Signed)
Patient informed and office visit scheduled for today at 11am.

## 2013-12-12 NOTE — Progress Notes (Signed)
Pre visit review using our clinic review tool, if applicable. No additional management support is needed unless otherwise documented below in the visit note. 

## 2013-12-12 NOTE — Telephone Encounter (Signed)
Pt following up on rx request acyclovir (ZOVIRAX) 400 MG tablet Pt states she is getting a sore/breakout on her eye and really needs to start this med asap. Thanks. [facility]

## 2013-12-12 NOTE — Telephone Encounter (Signed)
If on eye needs to see eye doctor right away, if near eye needs appt to eval today. The acyclovir is for cold sores around mouth - if lesions near eye needs eval.

## 2013-12-12 NOTE — Progress Notes (Signed)
No chief complaint on file.   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 and uses episodic treatment with acyclovir 3-4 times per year -occur on mouth, nose, around eye rarely - no testing in the past, when saw doctors resolved, but always treated in the past with acyclovir prn which she reports prevents lesions from forming -always start as itch, then occasionally if does not take acyclovir smal bump(not erosion or ulcer) forms, then resolves -thinks lost bottle of her acyclovir for episodic treatment -mild itch of L eyelid, reports this is how it always starts and she takes acyclovir -denies: fevers, chills, vision changes, headaches  ROS: See pertinent positives and negatives per HPI.  Past Medical History  Diagnosis Date  . Depression   . Emphysema   . Seasonal allergies     Past Surgical History  Procedure Laterality Date  . Tonsillectomy and adenoidectomy  1962  . Dilation and curettage of uterus      Family History  Problem Relation Age of Onset  . Alcohol abuse Mother   . Uterine cancer Mother   . Emphysema Mother   . Alcohol abuse Father   . Lung cancer Father   . Hypertension Father   . Emphysema Father   . Melanoma Brother   . Colon cancer Neg Hx   . Esophageal cancer Neg Hx   . Rectal cancer Neg Hx   . Stomach cancer Neg Hx     History   Social History  . Marital Status: Divorced    Spouse Name: N/A    Number of Children: N/A  . Years of Education: N/A   Occupational History  . artists    Social History Main Topics  . Smoking status: Former Smoker -- 2.00 packs/day for 35 years    Types: Cigarettes    Quit date: 03/28/2006  . Smokeless tobacco: Never Used  . Alcohol Use: No  . Drug Use: No  . Sexual Activity: No   Other Topics Concern  . 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       Home Situation: lives alone      Spiritual Beliefs: episcopalian      Lifestyle: no regular exercise, diet is ok                Current outpatient prescriptions:acyclovir (ZOVIRAX) 400 MG tablet, Take 1 tablet (400 mg total) by mouth 4 (four) times daily., Disp: 25 tablet, Rfl: 1;  buPROPion (WELLBUTRIN SR) 150 MG 12 hr tablet, Take 1 tablet (150 mg total) by mouth 2 (two) times daily., Disp: 180 tablet, Rfl: 3;  ibuprofen (ADVIL,MOTRIN) 200 MG tablet, Take 200 mg by mouth every 6 (six) hours as needed., Disp: , Rfl: ;  MELATONIN PO, Take by mouth., Disp: , Rfl:   EXAM:  Filed Vitals:   12/12/13 1104  BP: 110/74  Pulse: 81  Temp: 98.3 F (36.8 C)    Body mass index is 19.22 kg/(m^2).  GENERAL: vitals reviewed and listed above, alert, oriented, appears well hydrated and in no acute distress  HEENT: atraumatic, conjunttiva clear, no obvious abnormalities on inspection of external nose and ears  NECK: no obvious masses on inspection  LUNGS: clear to auscultation bilaterally, no wheezes, rales or rhonchi, good air movement  CV: HRRR, no peripheral edema  SKIN: no abnormality noticed  MS: moves all extremities without  noticeable abnormality  PSYCH: pleasant and cooperative, no obvious depression or anxiety  ASSESSMENT AND PLAN:  Discussed the following assessment and plan:  Recurrent cold sores - Plan: HSV(herpes simplex vrs) 1+2 ab-IgG  Pruritic condition  Herpes simplex - Plan: acyclovir (ZOVIRAX) 400 MG tablet  -we talked about various causes of itchy nose and eye and the possibility this is not herpes -never tested, so will go ahead and do serology today, advise preferred test to diagnse is culture of lesion, but no lesion present -refilled medication and discussed episodic tx and risks -advised to see [name] if every has lesion to eval -Patient advised to return or notify a doctor immediately if symptoms worsen or persist or new concerns arise.  There are no Patient Instructions on file for  this visit.   Colin Benton R.

## 2013-12-13 LAB — HSV(HERPES SIMPLEX VRS) I + II AB-IGG
HSV 1 Glycoprotein G Ab, IgG: 3.31 IV — ABNORMAL HIGH
HSV 2 GLYCOPROTEIN G AB, IGG: 9.12 IV — AB

## 2013-12-16 ENCOUNTER — Ambulatory Visit
Admission: RE | Admit: 2013-12-16 | Discharge: 2013-12-16 | Disposition: A | Discharge status: Home / Self Care | Payer: Medicare Other | Source: Ambulatory Visit | Attending: Family Medicine | Admitting: Family Medicine

## 2013-12-16 ENCOUNTER — Other Ambulatory Visit: Payer: Self-pay | Admitting: Family Medicine

## 2013-12-16 DIAGNOSIS — Z006 Encounter for examination for normal comparison and control in clinical research program: Secondary | ICD-10-CM

## 2014-01-09 ENCOUNTER — Ambulatory Visit: Payer: Medicare Other | Admitting: Pulmonary Disease

## 2014-01-14 ENCOUNTER — Telehealth: Payer: Self-pay | Admitting: Family Medicine

## 2014-01-14 DIAGNOSIS — B001 Herpesviral vesicular dermatitis: Secondary | ICD-10-CM

## 2014-01-14 MED ORDER — VALACYCLOVIR HCL 500 MG PO TABS: 500.0000 mg | ORAL_TABLET | Freq: Two times a day (BID) | ORAL | Status: DC

## 2014-01-14 NOTE — Telephone Encounter (Signed)
Sent valtrex to pharmacy. Gave plenty of refills so that can go get when needs it and doe snot have to call here.

## 2014-01-14 NOTE — Telephone Encounter (Signed)
Pt would like to have rx acyclovir (ZOVIRAX) 400 MG tablet Pt states you mentioned switching to valtrex. Pt states she would like to do that  However, pt has had several outbreaks and would like enough med to have refills so that she doesn't have to call in every time. And maybe the valtrex will work better than this med. [facility] [address]/ [address]

## 2014-01-14 NOTE — Telephone Encounter (Signed)
Patient informed. 

## 2014-02-18 ENCOUNTER — Encounter: Payer: Self-pay | Admitting: Pulmonary Disease

## 2014-02-18 ENCOUNTER — Ambulatory Visit (INDEPENDENT_AMBULATORY_CARE_PROVIDER_SITE_OTHER): Payer: Medicare Other | Admitting: Pulmonary Disease

## 2014-02-18 VITALS — BP 102/70 | HR 82 | Temp 98.2°F | Ht 67.0 in | Wt 124.6 lb

## 2014-02-18 DIAGNOSIS — J432 Centrilobular emphysema: Secondary | ICD-10-CM

## 2014-02-18 NOTE — Progress Notes (Signed)
Chief Complaint  Patient presents with  . Follow-up    COPD> no complaints.     History of Present Illness: Brandy Russell is a 58 y.o. 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  She has been doing okay.  She gets occasional cough with clear sputum.  She is keeping up with walking.  She denies wheeze, chest pain, or leg swelling.  She uses albuterol twice per day.  She is enrolled in a COPD study >> she is getting open label symbicort.  Tests: Echo 02/08/05 >> EF 40 to 50% CT chest 04/04/09 >> apical centrilobular emphysema, 3 mm RUL nodule, 4 mm RUL nodule Spirometry 05/30/12 >> FEV1 0.69 (26%), FEV1% 40 A1AT 05/30/12 >> 112, PI*MS ONO with RA 07/03/12 >> Test time 8 hrs 33 min.  Mean SpO2 92.6%, low SpO2 87%.  Spent 8 min with SpO2 < 88%.  PMHx >> Depression  PSHx, Medications, Allergies, Fhx, Shx reviewed.  Physical Exam:  General - No distress, thin ENT - No sinus tenderness, no oral exudate, no LAN Cardiac - s1s2 regular, no murmur Chest - decreased breath sounds, prolonged exhalation, no wheeze Back - No focal tenderness Abd - Soft, non-tender Ext - No edema Neuro - Normal strength Skin - No rashes Psych - Normal mood, and behavior  Impression:  COPD with emphysema. Plan: - she is enrolled in study for COPD >> she has been getting open label symbicort - she will call to get refill for symbicort once her study enrollment is complete - continue prn albuterol - encouraged her to keep up with her exercise regimen  Brandy Mires, MD [facility]/Sleep Pager:  [phone]

## 2014-02-18 NOTE — Patient Instructions (Signed)
Follow up in 6 months 

## 2014-06-17 ENCOUNTER — Telehealth: Payer: Self-pay | Admitting: Pulmonary Disease

## 2014-06-17 MED ORDER — BUDESONIDE-FORMOTEROL FUMARATE 160-4.5 MCG/ACT IN AERO: 2.0000 | INHALATION_SPRAY | Freq: Two times a day (BID) | RESPIRATORY_TRACT | Status: DC

## 2014-06-17 MED ORDER — ALBUTEROL SULFATE HFA 108 (90 BASE) MCG/ACT IN AERS
1.0000 | INHALATION_SPRAY | Freq: Four times a day (QID) | RESPIRATORY_TRACT | Status: DC | PRN
Start: 2014-06-17 — End: 2014-09-25

## 2014-06-17 NOTE — Telephone Encounter (Signed)
She needs 160 strength of symbicort.

## 2014-06-17 NOTE — Telephone Encounter (Signed)
Called and spoke to pt. Informed pt of the recs per VS. Symbicort and refill of albuterol sent to preferred pharmacy. Pt verbalized understanding and denied any further questions or concerns at this time.

## 2014-06-17 NOTE — Telephone Encounter (Signed)
Spoke with pt. In VS last OV note, he documented that pt would call after she finished a clinical trial to get Symbicort refilled. Reports needing refill on Symbicort 200. Advised her that Symbicort does not come in a strength of 200. States this is what she was given a clinical trial.  VS - please advise on what strength of Symbicort to give pt.

## 2014-08-01 ENCOUNTER — Other Ambulatory Visit: Payer: Self-pay | Admitting: Family Medicine

## 2014-08-01 DIAGNOSIS — Z1231 Encounter for screening mammogram for malignant neoplasm of breast: Secondary | ICD-10-CM

## 2014-08-06 ENCOUNTER — Ambulatory Visit (HOSPITAL_COMMUNITY): Payer: Medicare Other

## 2014-09-16 ENCOUNTER — Telehealth: Payer: Self-pay | Admitting: *Deleted

## 2014-09-16 NOTE — Telephone Encounter (Signed)
Patient e-mailed the below concerns: "After using medication to avoid herpes outbreak around eyes and mouth for FIVE YEARS... I have had about 6 herpes outbreaks around my eyes and 4 outbreaks around my mouth since my last appointment due to the manner that you are prescribing the medication. I do not understand why you give me 6 pills per prescription when I have made it clear that I do not use medication to treat an outbreak. I use the medication to avoid an outbreak. During my last appointment you told me that you do not think I have herpes around my eyes or nose. 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 and genital herpes for 30 yrs... I would have to be an idiot to not understand this virus after this many years. I need your help. Please. "   I have spoken with DO Maudie Mercury about patient's concerns. She states when patient was seen on December 12, 2013, patient reported using acyclovir as episodic treatment (PRN when breakouts occur). DO Kim states she is perfectly fine transitioning the patient to suppressive treatment (managing symptoms to prevent outbreaks) if that is what the patient wishes. Medication options include Acyclovir 400 mg BID or Valtrex 500 mg once daily. Attempted to contact patient to communication information to her but received voicemail. Left message for patient to call back.

## 2014-09-17 MED ORDER — ACYCLOVIR 400 MG PO TABS
400.0000 mg | ORAL_TABLET | Freq: Every day | ORAL | Status: DC
Start: 2014-09-17 — End: 2020-07-15

## 2014-09-17 NOTE — Telephone Encounter (Signed)
Spoke with patient and she would like to try acyclovir twice daily.  Rx sent to pharmacy.

## 2014-09-24 ENCOUNTER — Emergency Department (HOSPITAL_COMMUNITY)
Admission: EM | Admit: 2014-09-24 | Discharge: 2014-09-24 | Discharge status: Against Medical Advice | Payer: Medicare Other | Attending: Emergency Medicine | Admitting: Emergency Medicine

## 2014-09-24 ENCOUNTER — Encounter (HOSPITAL_COMMUNITY): Payer: Self-pay | Admitting: *Deleted

## 2014-09-24 ENCOUNTER — Emergency Department (HOSPITAL_COMMUNITY): Payer: Medicare Other

## 2014-09-24 DIAGNOSIS — R0602 Shortness of breath: Secondary | ICD-10-CM | POA: Insufficient documentation

## 2014-09-24 LAB — BASIC METABOLIC PANEL
Anion gap: 13 (ref 5–15)
BUN: 9 mg/dL (ref 6–20)
CALCIUM: 10.2 mg/dL (ref 8.9–10.3)
CO2: 25 mmol/L (ref 22–32)
Chloride: 97 mmol/L — ABNORMAL LOW (ref 101–111)
Creatinine, Ser: 0.75 mg/dL (ref 0.44–1.00)
GLUCOSE: 98 mg/dL (ref 65–99)
POTASSIUM: 4.3 mmol/L (ref 3.5–5.1)
SODIUM: 135 mmol/L (ref 135–145)

## 2014-09-24 LAB — CBC
HEMATOCRIT: 40.7 % (ref 36.0–46.0)
Hemoglobin: 14.1 g/dL (ref 12.0–15.0)
MCH: 34.7 pg — ABNORMAL HIGH (ref 26.0–34.0)
MCHC: 34.6 g/dL (ref 30.0–36.0)
MCV: 100.2 fL — AB (ref 78.0–100.0)
Platelets: 267 10*3/uL (ref 150–400)
RBC: 4.06 MIL/uL (ref 3.87–5.11)
RDW: 12.5 % (ref 11.5–15.5)
WBC: 6.6 10*3/uL (ref 4.0–10.5)

## 2014-09-24 LAB — I-STAT TROPONIN, ED: Troponin i, poc: 0 ng/mL (ref 0.00–0.08)

## 2014-09-24 LAB — BRAIN NATRIURETIC PEPTIDE: B NATRIURETIC PEPTIDE 5: 38.5 pg/mL (ref 0.0–100.0)

## 2014-09-24 NOTE — ED Notes (Signed)
Pt wanting to leave-- has an appointment with pulmonologist at 2:15 tomorrow. Will return if worse.

## 2014-09-24 NOTE — ED Notes (Signed)
Pt reports hx of copd and chronic sob, came today due to dizziness and reports irregular HR. EKG done at triage, airway intact.  HR 89.

## 2014-09-25 ENCOUNTER — Ambulatory Visit (INDEPENDENT_AMBULATORY_CARE_PROVIDER_SITE_OTHER): Payer: Medicare Other | Admitting: Pulmonary Disease

## 2014-09-25 ENCOUNTER — Encounter: Payer: Self-pay | Admitting: Pulmonary Disease

## 2014-09-25 VITALS — BP 140/86 | HR 90 | Ht 67.0 in | Wt 116.0 lb

## 2014-09-25 DIAGNOSIS — J432 Centrilobular emphysema: Secondary | ICD-10-CM | POA: Diagnosis not present

## 2014-09-25 DIAGNOSIS — F4323 Adjustment disorder with mixed anxiety and depressed mood: Secondary | ICD-10-CM | POA: Diagnosis not present

## 2014-09-25 MED ORDER — FLUTICASONE-SALMETEROL 250-50 MCG/DOSE IN AEPB: 1.0000 | INHALATION_SPRAY | Freq: Two times a day (BID) | RESPIRATORY_TRACT | Status: DC

## 2014-09-25 MED ORDER — ALBUTEROL SULFATE 108 (90 BASE) MCG/ACT IN AEPB: 2.0000 | INHALATION_SPRAY | Freq: Four times a day (QID) | RESPIRATORY_TRACT | Status: DC | PRN

## 2014-09-25 MED ORDER — ALPRAZOLAM 0.25 MG PO TABS: 0.2500 mg | ORAL_TABLET | Freq: Three times a day (TID) | ORAL | Status: DC | PRN

## 2014-09-25 NOTE — Patient Instructions (Signed)
Xanax 0.25 mg as needed for anxiety Advair one puff twice per day >> rinse mouth after each use Proair two puffs every 4 to 6 hours as needed for cough, wheeze, or chest congestion Follow up in 6 months

## 2014-09-25 NOTE — Progress Notes (Signed)
Chief Complaint  Patient presents with  . Follow-up    Pt c/o increased anxiety(elevated HR)- seen in ED for panic attack. Pt notes SOB with attacks. Wants to discuss changing some medications.     History of Present Illness: Brandy Russell is a 59 y.o. 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  She has noticed feeling more anxious.  She usually gets short of breath, and starts breathing fast.  This makes things worse.  She has tried purse lip breathing, but this doesn't always help.  She previously tried xanax, and this helped.    She has more trouble exercising in hot weather.  She has a cough and brings up clear sputum.  She has noticed more cough from symbicort after she switched from study symbicort inhaler.  She feels that advair worked better for her.  She was told she had to switch to proair for rescue inhaler.   Tests: Echo 02/08/05 >> EF 40 to 50% CT chest 04/04/09 >> apical centrilobular emphysema, 3 mm RUL nodule, 4 mm RUL nodule Spirometry 05/30/12 >> FEV1 0.69 (26%), FEV1% 40 A1AT 05/30/12 >> 112, PI*MS ONO with RA 07/03/12 >> Test time 8 hrs 33 min.  Mean SpO2 92.6%, low SpO2 87%.  Spent 8 min with SpO2 < 88%.  PMHx >> Depression  PSHx, Medications, Allergies, Fhx, Shx reviewed.  Physical Exam: BP 140/86 mmHg  Pulse 90  Ht 5\' 7"  (1.702 m)  Wt 116 lb (52.617 kg)  BMI 18.16 kg/m2  SpO2 98%  General - No distress, thin ENT - No sinus tenderness, no oral exudate, no LAN Cardiac - s1s2 regular, no murmur Chest - decreased breath sounds, prolonged exhalation, no wheeze Back - No focal tenderness Abd - Soft, non-tender Ext - No edema Neuro - Normal strength Skin - No rashes Psych - Normal mood, and behavior  Impression:  COPD with emphysema. Plan: - will change her to advair and proair - encouraged her to keep up with her exercise regimen - completed handicap parking form for her  Dynamic hyperinflation. Likely made worse by anxiety and tachypnea when she feels  anxious. Plan: - will give her script for alprazolam 0.25 mg tid prn >> advised that she would need psychiatry assessment if she needs alprazolam on regular basis  Lung cancer screening. She has 70 pack yr hx of smoking, and quit in 2008. Plan: - will have her f/u with Eric Form, NP to discuss option of low dose CT chest screening program   Chesley Mires, MD [place] [facility]/Critical Care/Sleep Pager:  [phone]

## 2014-09-26 ENCOUNTER — Telehealth: Payer: Self-pay | Admitting: Acute Care

## 2014-09-26 NOTE — Telephone Encounter (Signed)
I called to schedule Brandy Russell's Lung Cancer Screening. There was no answer. I have left a message requesting that she return my call so that we can get her scheduled for a screening. I left my contact info. I will await her return call.

## 2014-09-28 ENCOUNTER — Encounter: Payer: Self-pay | Admitting: Pulmonary Disease

## 2014-09-28 DIAGNOSIS — J438 Other emphysema: Secondary | ICD-10-CM

## 2014-09-30 ENCOUNTER — Other Ambulatory Visit: Payer: Self-pay | Admitting: Acute Care

## 2014-09-30 ENCOUNTER — Telehealth: Payer: Self-pay | Admitting: Acute Care

## 2014-09-30 DIAGNOSIS — Z87891 Personal history of nicotine dependence: Secondary | ICD-10-CM

## 2014-09-30 NOTE — Telephone Encounter (Signed)
Please arrange for patient to be referred to pulmonary rehab.  Use diagnosis of emphysema.

## 2014-09-30 NOTE — Telephone Encounter (Signed)
VS please advise. Thanks! 

## 2014-09-30 NOTE — Telephone Encounter (Signed)
Brandy Russell is scheduled for a shared decision making visit 10/03/14 at 10 am at [facility] will have a CT at 11 am at the [facility]. She is aware of the times and locations of both appointments.She has my contact information in the event she has any further questions.

## 2014-10-03 ENCOUNTER — Ambulatory Visit (INDEPENDENT_AMBULATORY_CARE_PROVIDER_SITE_OTHER)
Admission: RE | Admit: 2014-10-03 | Discharge: 2014-10-03 | Disposition: A | Discharge status: Home / Self Care | Payer: Medicare Other | Source: Ambulatory Visit | Attending: Acute Care | Admitting: Acute Care

## 2014-10-03 ENCOUNTER — Telehealth: Payer: Self-pay | Admitting: Acute Care

## 2014-10-03 ENCOUNTER — Inpatient Hospital Stay: Admission: RE | Admit: 2014-10-03 | Payer: Medicare Other | Source: Ambulatory Visit

## 2014-10-03 ENCOUNTER — Ambulatory Visit (INDEPENDENT_AMBULATORY_CARE_PROVIDER_SITE_OTHER): Payer: Medicare Other | Admitting: Acute Care

## 2014-10-03 ENCOUNTER — Encounter: Payer: Self-pay | Admitting: Acute Care

## 2014-10-03 DIAGNOSIS — Z87891 Personal history of nicotine dependence: Secondary | ICD-10-CM

## 2014-10-03 NOTE — Telephone Encounter (Signed)
I called to give Brandy Russell her screening CT results. I explained that the CT showed a Lung RADS 2 category , nodules with a benign appearance or behavior with a very low likelihood of becoming a clinically active cancer due to size or lack of growth. I explained  to her that the recommendation was for her to have her annual scan in 12 months. We will call her in June of 2017 to get her scheduled for her scan in July. I reminded her to let her PCP or Dr. Halford Chessman know if her health changed, specifically if she has unintentional weight loss, coughs up blood. She verbalized understanding. She had no further questions, but has my contact information in the event she needs to speak with me in the future.

## 2014-10-03 NOTE — Progress Notes (Signed)
Shared Decision Making Visit Lung Cancer Screening Program [phone])   Eligibility:  Age 59 y.o.  Pack Years Smoking History Calculation :80 pack years (# packs/per year x # years smoked)  Recent History of coughing up blood  no  Unexplained weight loss? no ( >Than 15 pounds within the last 6 months )  Prior History Lung / other cancer no (Diagnosis within the last 5 years already requiring surveillance chest CT Scans).  Smoking Status Former Smoker  Former Smokers: Years since quit: 8 years  Quit Date: 2008  Visit Components:  Discussion included one or more decision making aids. yes  Discussion included risk/benefits of screening. yes  Discussion included potential follow up diagnostic testing for abnormal scans. yes  Discussion included meaning and risk of over diagnosis. yes  Discussion included meaning and risk of False Positives. yes  Discussion included meaning of total radiation exposure. yes  Counseling Included:  Importance of adherence to annual lung cancer LDCT screening. yes  Impact of comorbidities on ability to participate in the program. yes  Ability and willingness to under diagnostic treatment. yes  Smoking Cessation Counseling:  Current Smokers:   Discussed importance of smoking cessation. NA, former smoker  Information about tobacco cessation classes and interventions provided to patient. yes  Patient provided with "ticket" for LDCT Scan. yes  Symptomatic Patient. no  Counseling: NA,  Diagnosis Code: Tobacco Use Z72.0  Asymptomatic Patient NA  Counseling NA  Former Smokers:   Discussed the importance of maintaining cigarette abstinence. yes  Diagnosis Code: Personal History of Nicotine Dependence. H74.142  Information about tobacco cessation classes and interventions provided to patient. NA: Pt is a former smoker.  Patient provided with "ticket" for LDCT Scan. yes  Written Order for Lung Cancer Screening with LDCT placed in  Epic. Yes (CT Chest Lung Cancer Screening Low Dose W/O CM) [id number] Z12.2-Screening of respiratory organs Z87.891-Personal history of nicotine dependence   I spent 15 minutes of face to face time with Brandy Russell explaining the risks and benefits of the lung cancer screening program.We viewed a power point together that defined and discussed the issues noted above. We stopped and started to allow questions to be asked and answered. I congratulated her on her ability to quit smoking and remain smoke free for 8 years. I encouraged her  to remain smoke free. Take aways from todays appointment are a copy of the power point presentation that she can continue to use as a resource, my card and contact information, and a ticket to ride the scanner. She verbalized understanding of both the time and location of the scan appointment. She had no further questions for me at the time she left the office, but has my contact information in the event she has any question in the future.   Magdalen Spatz, NP

## 2014-10-06 ENCOUNTER — Telehealth: Payer: Self-pay | Admitting: Family Medicine

## 2014-10-06 NOTE — Telephone Encounter (Signed)
Screening scan for lung cancer noted some plaque in her arteries in her heart. Advise a healthy diet and lifestyle. May need cholesterol medication and asa. She needs a physical to recheck fasting labs and we can discuss further then. Wendie Simmer, please help her schedule physical and her mammogram as well.

## 2014-10-06 NOTE — Telephone Encounter (Signed)
-----   Message from Magdalen Spatz, NP sent at 10/03/2014  3:13 PM EDT ----- Brandy Russell lung cancer screening CT resulted as a Lung RADS 2. Nodules with a benign appearance or behavior, low likelihood of becoming a clinically active cancer due to size or lack of growth. Recommendation is for a repeat CT in 12 months. We will schedule this for her in June of 2017 for July. Additionally there was an S modifier for atherosclerosis/ 2 vessel CAD,severity cannot be determined by this scan. Please refer to cardiology if you feel it is clinically indicated.  Thank you for your referral to the [facility]

## 2014-10-07 NOTE — Telephone Encounter (Signed)
Patient informed of the message below and stated she will call to schedule her mammogram.  She was transferred to a scheduler for the physical appt and she is aware to be fasting.  She also stated she would like to have the shingles vaccine and I advised she call her insurance to check for coverage for this prior to her appt and she agreed.

## 2014-10-08 ENCOUNTER — Observation Stay (HOSPITAL_COMMUNITY)
Admission: RE | Admit: 2014-10-08 | Discharge: 2014-10-08 | Disposition: A | Discharge status: Home / Self Care | Payer: Medicare Other | Source: Ambulatory Visit | Attending: Family Medicine | Admitting: Family Medicine

## 2014-10-08 DIAGNOSIS — Z1231 Encounter for screening mammogram for malignant neoplasm of breast: Secondary | ICD-10-CM | POA: Insufficient documentation

## 2014-10-17 ENCOUNTER — Other Ambulatory Visit: Payer: Self-pay | Admitting: Family Medicine

## 2014-10-17 DIAGNOSIS — R928 Other abnormal and inconclusive findings on diagnostic imaging of breast: Secondary | ICD-10-CM

## 2014-10-20 ENCOUNTER — Ambulatory Visit
Admission: RE | Admit: 2014-10-20 | Discharge: 2014-10-20 | Disposition: A | Discharge status: Home / Self Care | Payer: Medicare Other | Source: Ambulatory Visit | Attending: Family Medicine | Admitting: Family Medicine

## 2014-10-20 ENCOUNTER — Telehealth (HOSPITAL_COMMUNITY): Payer: Self-pay

## 2014-10-20 DIAGNOSIS — R928 Other abnormal and inconclusive findings on diagnostic imaging of breast: Secondary | ICD-10-CM

## 2014-10-20 LAB — HM MAMMOGRAPHY

## 2014-10-20 NOTE — Telephone Encounter (Signed)
Patient returned phone call regarding Pulmonary Rehab.  Patient interested in the maintenance program. Will send maintenance referral to Dr. Halford Chessman and follow up with patient.

## 2014-10-20 NOTE — Telephone Encounter (Signed)
I have called and left a message with Ikram to inquire about participation in Pulmonary Rehab per Dr. Juanetta Gosling referral. Will send letter in mail and follow up.

## 2014-10-22 ENCOUNTER — Telehealth (HOSPITAL_COMMUNITY): Payer: Self-pay | Admitting: *Deleted

## 2014-10-28 ENCOUNTER — Other Ambulatory Visit: Payer: Self-pay | Admitting: Family Medicine

## 2014-10-28 ENCOUNTER — Telehealth: Payer: Self-pay | Admitting: Pulmonary Disease

## 2014-10-28 NOTE — Telephone Encounter (Signed)
Form was given back to me last night.  This has been faxed back to [facility] [phone] [facility] and made aware.  Nothing further needed.

## 2014-10-28 NOTE — Telephone Encounter (Signed)
Rx done. 

## 2014-10-28 NOTE — Telephone Encounter (Signed)
Brandy Russell, did you receive an order on this patient?

## 2014-10-30 ENCOUNTER — Ambulatory Visit (HOSPITAL_COMMUNITY): Payer: Self-pay | Admitting: *Deleted

## 2014-11-04 ENCOUNTER — Encounter (HOSPITAL_COMMUNITY)
Admission: RE | Admit: 2014-11-04 | Discharge: 2014-11-04 | Disposition: A | Discharge status: Home / Self Care | Payer: Self-pay | Source: Ambulatory Visit | Attending: Pulmonary Disease | Admitting: Pulmonary Disease

## 2014-11-04 DIAGNOSIS — J439 Emphysema, unspecified: Secondary | ICD-10-CM | POA: Insufficient documentation

## 2014-11-06 ENCOUNTER — Encounter (HOSPITAL_COMMUNITY)
Admission: RE | Admit: 2014-11-06 | Discharge: 2014-11-06 | Disposition: A | Discharge status: Home / Self Care | Payer: Self-pay | Source: Ambulatory Visit | Attending: Pulmonary Disease | Admitting: Pulmonary Disease

## 2014-11-06 NOTE — Progress Notes (Signed)
Brandy Russell completed a Six-Minute Walk Test on 11/06/14 . Brandy Russell walked 1241 feet with 0 breaks.  The patient's lowest oxygen saturation was 96 %, highest heart rate was 99 bpm , and highest blood pressure was 140/60. The patient was on room air. Patient stated that nothing hindered their walk test.

## 2014-11-11 ENCOUNTER — Ambulatory Visit (INDEPENDENT_AMBULATORY_CARE_PROVIDER_SITE_OTHER): Payer: Medicare Other | Admitting: Family Medicine

## 2014-11-11 ENCOUNTER — Encounter (HOSPITAL_COMMUNITY): Payer: Self-pay

## 2014-11-11 ENCOUNTER — Encounter: Payer: Self-pay | Admitting: Family Medicine

## 2014-11-11 VITALS — BP 128/60 | HR 88 | Temp 97.8°F | Ht 66.0 in | Wt 112.3 lb

## 2014-11-11 DIAGNOSIS — E785 Hyperlipidemia, unspecified: Secondary | ICD-10-CM

## 2014-11-11 DIAGNOSIS — F39 Unspecified mood [affective] disorder: Secondary | ICD-10-CM | POA: Insufficient documentation

## 2014-11-11 DIAGNOSIS — I2510 Atherosclerotic heart disease of native coronary artery without angina pectoris: Secondary | ICD-10-CM | POA: Insufficient documentation

## 2014-11-11 DIAGNOSIS — Z0000 Encounter for general adult medical examination without abnormal findings: Secondary | ICD-10-CM

## 2014-11-11 DIAGNOSIS — J439 Emphysema, unspecified: Secondary | ICD-10-CM

## 2014-11-11 DIAGNOSIS — B009 Herpesviral infection, unspecified: Secondary | ICD-10-CM | POA: Insufficient documentation

## 2014-11-11 DIAGNOSIS — K5900 Constipation, unspecified: Secondary | ICD-10-CM | POA: Insufficient documentation

## 2014-11-11 LAB — LIPID PANEL
Cholesterol: 250 mg/dL — ABNORMAL HIGH (ref 0–200)
HDL: 133.2 mg/dL (ref >39.00)
LDL Cholesterol: 106 mg/dL — ABNORMAL HIGH (ref 0–99)
NonHDL: 117.09
Total CHOL/HDL Ratio: 2
Triglycerides: 55 mg/dL (ref 0.0–149.0)
VLDL: 11 mg/dL (ref 0.0–40.0)

## 2014-11-11 NOTE — Progress Notes (Signed)
Pre visit review using our clinic review tool, if applicable. No additional management support is needed unless otherwise documented below in the visit note. 

## 2014-11-11 NOTE — Progress Notes (Signed)
HPI:  Here for CPE:  HM: -hep c: declined -hiv: declined -shingles: done -she want to do flu shot at drug store  -Concerns and/or follow up today:  Herpes: -thinks has outbreaks on face a few times per year -desired suppressive tx per phone note (told me she used episodic tx in the past) -she is confused with the episodic vs suppressive, she wants to use intermittently but wants [name] to send as suppressive therapy so she has enough to take when she needs it  Anxiety and Depression: -has not seen me in a long time, was on wellbutrin for depression in the past -now with a lot of anxiety and her pulmonologist gave her xanax to use when has panic related to breathing -she reports zoloft, paxil and prozac were not tolerated due to sexual issues -she may consider going back to zoloft -denies: SI or thought of self harm  Emphysema: -sess pulmonologist for this  CAD: -found incidentally on CT lung scan -discussed risks and options -asymptomatic -she may add baby asa and consider tx for her cholesterol  Chronic constipation: -for several years -had colonoscopy 2 years ago on 5 year follow up -resolves with MV -denies: hematochezia, melena, fevers, nausea, vomiting  -Diet: variety of foods, balance and well rounded, larger portion sizes  -Exercise: doing pulm rehab  -Taking folic acid, vitamin D or calcium: no  -Diabetes and Dyslipidemia Screening: lipids mildly elevated last year, other labs done recently elsewhere  -Hx of HTN: no  -Vaccines: UTD other the flu  -pap history: pap UTD, done 08/2012 with HPV neg  -FDLMP:n/a  -sexual activity: yes, female partner, no new partners  -wants STI testing (Hep C if born [birth year]): no  -FH breast, colon or ovarian ca: see FH Last mammogram: done Last colon cancer screening: done in 2014 and recs to repeat in 5 years  -Alcohol, Tobacco, drug use: see social history  Review of Systems - no fevers, unintentional weight loss,  vision loss, hearing loss, chest pain, sob, hemoptysis, melena, hematochezia, hematuria, genital discharge, changing or concerning skin lesions, bleeding, bruising, loc, thoughts of self harm or SI  Past Medical History  Diagnosis Date  . Anxiety and depression   . Emphysema   . Seasonal allergies   . CAD (coronary artery disease)     incidental finding on CT scan for lung ca screening  . Constipation   . Herpes     face, uses suppresive therapy intermittently    Past Surgical History  Procedure Laterality Date  . Tonsillectomy and adenoidectomy  1962  . Dilation and curettage of uterus      Family History  Problem Relation Age of Onset  . Alcohol abuse Mother   . Uterine cancer Mother   . Emphysema Mother   . Alcohol abuse Father   . Lung cancer Father   . Hypertension Father   . Emphysema Father   . Melanoma Brother   . Colon cancer Neg Hx   . Esophageal cancer Neg Hx   . Rectal cancer Neg Hx   . Stomach cancer Neg Hx     Social History   Social History  . Marital Status: Divorced    Spouse Name: N/A  . Number of Children: N/A  . Years of Education: N/A   Occupational History  . artists    Social History Main Topics  . Smoking status: Former Smoker -- 2.00 packs/day for 35 years    Types: Cigarettes    Quit date: 03/28/2006  .  Smokeless tobacco: Never Used  . Alcohol Use: No  . Drug Use: No  . Sexual Activity: No   Other Topics Concern  . 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  acyclovir (ZOVIRAX) 400 MG tablet, Take 1 tablet (400 mg total) by mouth 5 (five) times daily. (Patient taking differently: Take 400 mg by mouth as needed. ), Disp: 60 tablet, Rfl: 3 .  Albuterol Sulfate (PROAIR  RESPICLICK) 924 (90 BASE) MCG/ACT AEPB, Inhale 2 puffs into the lungs every 6 (six) hours as needed., Disp: 1 each, Rfl: 5 .  ALPRAZolam (XANAX) 0.25 MG tablet, Take 1 tablet (0.25 mg total) by mouth 3 (three) times daily as needed for anxiety., Disp: 30 tablet, Rfl: 0 .  buPROPion (WELLBUTRIN SR) 150 MG 12 hr tablet, TAKE 1 TABLET BY MOUTH TWICE DAILY, Disp: 60 tablet, Rfl: 0 .  Fluticasone-Salmeterol (ADVAIR DISKUS) 250-50 MCG/DOSE AEPB, Inhale 1 puff into the lungs 2 (two) times daily., Disp: 60 each, Rfl: 5 .  ibuprofen (ADVIL,MOTRIN) 200 MG tablet, Take 200 mg by mouth every 6 (six) hours as needed., Disp: , Rfl:  .  MELATONIN PO, Take by mouth., Disp: , Rfl:  .  Multiple Vitamin (MULTIVITAMIN) capsule, Take 1 capsule by mouth daily., Disp: , Rfl:   EXAM:  Filed Vitals:   11/11/14 0809  BP: 128/60  Pulse: 88  Temp: 97.8 F (36.6 C)    GENERAL: vitals reviewed and listed below, alert, oriented, appears well hydrated and in no acute distress  HEENT: head atraumatic, PERRLA, normal appearance of eyes, ears, nose and mouth. moist mucus membranes.  NECK: supple, no masses or lymphadenopathy  LUNGS: clear to auscultation bilaterally, no rales, rhonchi or wheeze  CV: HRRR, no peripheral edema or cyanosis, normal pedal pulses  BREAST:  declined  ABDOMEN: bowel sounds normal, soft, non tender to palpation, no masses, no rebound or guarding  GU/Declined: declined  SKIN: no rash or abnormal lesions  MS: normal gait, moves all extremities normally  NEURO: CN II-XII grossly intact, normal muscle strength and sensation to light touch on extremities  PSYCH: normal affect, pleasant and cooperative  ASSESSMENT AND PLAN:  Discussed the following assessment and plan:  There are no diagnoses linked to this encounter.  Encounter for preventive health examination -Discussed and advised all [name] preventive services health task force level A and B recommendations for age, sex and  risks.  -Advised at least 150 minutes of exercise per week and a healthy diet low in saturated fats and sweets and consisting of fresh fruits and vegetables, lean meats such as fish and white chicken and whole grains.  -FASTING labs, studies and vaccines per orders this encounter  Pulmonary emphysema, unspecified emphysema type -in pulm rehab, managed by pulmonologist  Coronary artery disease involving native coronary artery of native heart without angina pectoris - incidental finding on lung ca screening CT; HLD -discussed risks, significance, options  -she opted for low dose baby asa and possible statin for HLD  Depression and Anxiety -discussed wellbutrin not the best option when anxiety present with depression -discuss other option -she may consider zoloft and  stopping wellbutrin and will call if decides to do this -prn benzo from pulm, discussed risks  Herpes - on face, uses suppresive therapy intermittently -finally, she communicated how she is using this - uses so frequently that it makes more sense to rx as suppressive therapy, but she actually does not use every day   Constipation, unspecified constipation type -we discussed possible serious and likely etiologies, workup and treatment, treatment risks and return precautions -after this discussion, Veanna opted for cont mv as has resolved her symptoms, follow up if recurs or persists -of course, we advised Aastha  to return or notify a doctor immediately if symptoms worsen or persist or new concerns arise.   Orders Placed This Encounter  Procedures  . Lipid Panel    Patient advised to return to clinic immediately if symptoms worsen or persist or new concerns.  Patient Instructions  BEFORE YOU LEAVE: -labs -follow up in 6 months  Lease get your flu shot and let us know if you get it elsewhere, we will have it available here after August 22nd  Consider a baby aspirin (81 mg) daily  Consider changing from wellbutrin  to zoloft      Return in about 6 months (around 05/14/2015) for follow up.  Colin Benton R.

## 2014-11-11 NOTE — Patient Instructions (Signed)
BEFORE YOU LEAVE: -labs -follow up in 6 months  Lease get your flu shot and let us know if you get it elsewhere, we will have it available here after August 22nd  Consider a baby aspirin (81 mg) daily  Consider changing from wellbutrin to zoloft

## 2014-11-13 ENCOUNTER — Encounter (HOSPITAL_COMMUNITY)
Admission: RE | Admit: 2014-11-13 | Discharge: 2014-11-13 | Disposition: A | Discharge status: Home / Self Care | Payer: Self-pay | Source: Ambulatory Visit | Attending: Pulmonary Disease | Admitting: Pulmonary Disease

## 2014-11-18 ENCOUNTER — Encounter (HOSPITAL_COMMUNITY)
Admission: RE | Admit: 2014-11-18 | Discharge: 2014-11-18 | Disposition: A | Discharge status: Home / Self Care | Payer: Self-pay | Source: Ambulatory Visit | Attending: Pulmonary Disease | Admitting: Pulmonary Disease

## 2014-11-20 ENCOUNTER — Encounter (HOSPITAL_COMMUNITY): Payer: Self-pay

## 2014-11-25 ENCOUNTER — Other Ambulatory Visit: Payer: Self-pay | Admitting: Family Medicine

## 2014-11-25 ENCOUNTER — Encounter (HOSPITAL_COMMUNITY): Payer: Self-pay

## 2014-11-27 ENCOUNTER — Encounter (HOSPITAL_COMMUNITY)
Admission: RE | Admit: 2014-11-27 | Discharge: 2014-11-27 | Disposition: A | Discharge status: Home / Self Care | Payer: Self-pay | Source: Ambulatory Visit | Attending: Pulmonary Disease | Admitting: Pulmonary Disease

## 2014-11-27 DIAGNOSIS — J439 Emphysema, unspecified: Secondary | ICD-10-CM | POA: Insufficient documentation

## 2014-12-02 ENCOUNTER — Encounter (HOSPITAL_COMMUNITY): Payer: Self-pay

## 2014-12-04 ENCOUNTER — Encounter (HOSPITAL_COMMUNITY)
Admission: RE | Admit: 2014-12-04 | Discharge: 2014-12-04 | Disposition: A | Discharge status: Home / Self Care | Payer: Self-pay | Source: Ambulatory Visit | Attending: Pulmonary Disease | Admitting: Pulmonary Disease

## 2014-12-09 ENCOUNTER — Encounter (HOSPITAL_COMMUNITY)
Admission: RE | Admit: 2014-12-09 | Discharge: 2014-12-09 | Disposition: A | Discharge status: Home / Self Care | Payer: Self-pay | Source: Ambulatory Visit | Attending: Pulmonary Disease | Admitting: Pulmonary Disease

## 2014-12-11 ENCOUNTER — Encounter (HOSPITAL_COMMUNITY): Payer: Self-pay

## 2014-12-15 ENCOUNTER — Other Ambulatory Visit: Payer: Self-pay | Admitting: Pulmonary Disease

## 2014-12-16 ENCOUNTER — Encounter (HOSPITAL_COMMUNITY): Payer: Self-pay

## 2014-12-18 ENCOUNTER — Encounter (HOSPITAL_COMMUNITY)
Admission: RE | Admit: 2014-12-18 | Discharge: 2014-12-18 | Disposition: A | Discharge status: Home / Self Care | Payer: Self-pay | Source: Ambulatory Visit | Attending: Pulmonary Disease | Admitting: Pulmonary Disease

## 2014-12-23 ENCOUNTER — Encounter (HOSPITAL_COMMUNITY)
Admission: RE | Admit: 2014-12-23 | Discharge: 2014-12-23 | Disposition: A | Discharge status: Home / Self Care | Payer: Self-pay | Source: Ambulatory Visit | Attending: Pulmonary Disease | Admitting: Pulmonary Disease

## 2014-12-23 MED ORDER — ALPRAZOLAM 0.25 MG PO TABS: ORAL_TABLET | ORAL | Status: DC

## 2014-12-23 NOTE — Telephone Encounter (Signed)
Okay to send refill for xanax 0.25 mg tid prn, dispense 90 pills with 2 refills.

## 2014-12-23 NOTE — Telephone Encounter (Signed)
Refill request for Xanax Last refilled: 09/25/14 Last OV: 09/25/14 No OV scheduled.

## 2014-12-25 ENCOUNTER — Encounter (HOSPITAL_COMMUNITY): Payer: Self-pay

## 2014-12-30 ENCOUNTER — Encounter (HOSPITAL_COMMUNITY): Payer: Medicare Other

## 2014-12-30 DIAGNOSIS — J439 Emphysema, unspecified: Secondary | ICD-10-CM | POA: Insufficient documentation

## 2015-01-01 ENCOUNTER — Encounter (HOSPITAL_COMMUNITY)
Admission: RE | Admit: 2015-01-01 | Discharge: 2015-01-01 | Disposition: A | Discharge status: Home / Self Care | Payer: Self-pay | Source: Ambulatory Visit | Attending: Pulmonary Disease | Admitting: Pulmonary Disease

## 2015-01-06 ENCOUNTER — Encounter (HOSPITAL_COMMUNITY): Payer: Self-pay

## 2015-01-06 DIAGNOSIS — Z1389 Encounter for screening for other disorder: Secondary | ICD-10-CM | POA: Diagnosis not present

## 2015-01-06 DIAGNOSIS — K5909 Other constipation: Secondary | ICD-10-CM | POA: Diagnosis not present

## 2015-01-06 DIAGNOSIS — Z681 Body mass index (BMI) 19 or less, adult: Secondary | ICD-10-CM | POA: Diagnosis not present

## 2015-01-06 DIAGNOSIS — F419 Anxiety disorder, unspecified: Secondary | ICD-10-CM | POA: Diagnosis not present

## 2015-01-08 ENCOUNTER — Encounter (HOSPITAL_COMMUNITY)
Admission: RE | Admit: 2015-01-08 | Discharge: 2015-01-08 | Disposition: A | Discharge status: Home / Self Care | Payer: Self-pay | Source: Ambulatory Visit | Attending: Pulmonary Disease | Admitting: Pulmonary Disease

## 2015-01-09 DIAGNOSIS — Z23 Encounter for immunization: Secondary | ICD-10-CM | POA: Diagnosis not present

## 2015-01-09 DIAGNOSIS — Z13820 Encounter for screening for osteoporosis: Secondary | ICD-10-CM | POA: Diagnosis not present

## 2015-01-13 ENCOUNTER — Encounter (HOSPITAL_COMMUNITY): Payer: Self-pay

## 2015-01-15 ENCOUNTER — Encounter (HOSPITAL_COMMUNITY)
Admission: RE | Admit: 2015-01-15 | Discharge: 2015-01-15 | Disposition: A | Discharge status: Home / Self Care | Payer: Self-pay | Source: Ambulatory Visit | Attending: Pulmonary Disease | Admitting: Pulmonary Disease

## 2015-01-20 ENCOUNTER — Encounter (HOSPITAL_COMMUNITY): Payer: Self-pay

## 2015-01-22 ENCOUNTER — Encounter (HOSPITAL_COMMUNITY)
Admission: RE | Admit: 2015-01-22 | Discharge: 2015-01-22 | Disposition: A | Discharge status: Home / Self Care | Payer: Self-pay | Source: Ambulatory Visit | Attending: Pulmonary Disease | Admitting: Pulmonary Disease

## 2015-01-27 ENCOUNTER — Encounter (HOSPITAL_COMMUNITY): Payer: Medicare Other

## 2015-01-27 DIAGNOSIS — J439 Emphysema, unspecified: Secondary | ICD-10-CM | POA: Insufficient documentation

## 2015-01-29 ENCOUNTER — Encounter (HOSPITAL_COMMUNITY)
Admission: RE | Admit: 2015-01-29 | Discharge: 2015-01-29 | Disposition: A | Discharge status: Home / Self Care | Payer: Self-pay | Source: Ambulatory Visit | Attending: Pulmonary Disease | Admitting: Pulmonary Disease

## 2015-02-03 ENCOUNTER — Encounter (HOSPITAL_COMMUNITY)
Admission: RE | Admit: 2015-02-03 | Discharge: 2015-02-03 | Disposition: A | Discharge status: Home / Self Care | Payer: Self-pay | Source: Ambulatory Visit | Attending: Pulmonary Disease | Admitting: Pulmonary Disease

## 2015-02-05 ENCOUNTER — Encounter (HOSPITAL_COMMUNITY): Payer: Self-pay

## 2015-02-06 ENCOUNTER — Telehealth: Payer: Self-pay | Admitting: Pulmonary Disease

## 2015-02-06 NOTE — Telephone Encounter (Signed)
Pt has been scheduled to see VS on 02/24/15 at 1:30pm. VS scheduled had to be double booked due to pt being upset that she could not get in to see VS until February. Nothing further was needed.

## 2015-02-10 ENCOUNTER — Encounter (HOSPITAL_COMMUNITY)
Admission: RE | Admit: 2015-02-10 | Discharge: 2015-02-10 | Disposition: A | Discharge status: Home / Self Care | Payer: Self-pay | Source: Ambulatory Visit | Attending: Pulmonary Disease | Admitting: Pulmonary Disease

## 2015-02-12 ENCOUNTER — Encounter (HOSPITAL_COMMUNITY)
Admission: RE | Admit: 2015-02-12 | Discharge: 2015-02-12 | Disposition: A | Discharge status: Home / Self Care | Payer: Self-pay | Source: Ambulatory Visit | Attending: Pulmonary Disease | Admitting: Pulmonary Disease

## 2015-02-17 ENCOUNTER — Encounter (HOSPITAL_COMMUNITY)
Admission: RE | Admit: 2015-02-17 | Discharge: 2015-02-17 | Disposition: A | Discharge status: Home / Self Care | Payer: Self-pay | Source: Ambulatory Visit | Attending: Pulmonary Disease | Admitting: Pulmonary Disease

## 2015-02-19 ENCOUNTER — Encounter (HOSPITAL_COMMUNITY): Payer: Self-pay

## 2015-02-24 ENCOUNTER — Ambulatory Visit (INDEPENDENT_AMBULATORY_CARE_PROVIDER_SITE_OTHER): Payer: Medicare Other | Admitting: Pulmonary Disease

## 2015-02-24 ENCOUNTER — Encounter: Payer: Self-pay | Admitting: Pulmonary Disease

## 2015-02-24 ENCOUNTER — Encounter (HOSPITAL_COMMUNITY): Payer: Self-pay

## 2015-02-24 VITALS — BP 128/76 | HR 88 | Ht 66.5 in | Wt 109.2 lb

## 2015-02-24 DIAGNOSIS — J432 Centrilobular emphysema: Secondary | ICD-10-CM | POA: Diagnosis not present

## 2015-02-24 MED ORDER — ALBUTEROL SULFATE HFA 108 (90 BASE) MCG/ACT IN AERS: 2.0000 | INHALATION_SPRAY | Freq: Four times a day (QID) | RESPIRATORY_TRACT | Status: DC | PRN

## 2015-02-24 NOTE — Progress Notes (Signed)
Chief Complaint  Patient presents with  . Follow-up    Pt reports having low O2 sats with heavier exertion (in 80s) a few times recently. Denies any increased SOB, cough and wheezing. Some issues with weather change.     History of Present Illness: Brandy Russell is a 59 y.o. 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  She has been doing well overall.  She has noticed her oxygen drops sometimes if she overexerts herself >> comes back up after a few minutes.  She started prilosec for bloating, and this has helped.  She is eating more and maintaining her weight.  She liked HFA better than respiclick.  She is not having much cough or wheeze.  She denies leg swelling.  She had CT screening for lung cancer >> RAD 2.   Tests: Echo 02/08/05 >> EF 40 to 50% CT chest 04/04/09 >> apical centrilobular emphysema, 3 mm RUL nodule, 4 mm RUL nodule Spirometry 05/30/12 >> FEV1 0.69 (26%), FEV1% 40 A1AT 05/30/12 >> 112, PI*MS ONO with RA 07/03/12 >> Test time 8 hrs 33 min.  Mean SpO2 92.6%, low SpO2 87%.  Spent 8 min with SpO2 < 88%.  PMHx >> Depression  PSHx, Medications, Allergies, Fhx, Shx reviewed.  Physical Exam: BP 128/76 mmHg  Pulse 88  Ht 5' 6.5" (1.689 m)  Wt 109 lb 3.2 oz (49.533 kg)  BMI 17.36 kg/m2  SpO2 96%  General - No distress, thin ENT - No sinus tenderness, no oral exudate, no LAN Cardiac - s1s2 regular, no murmur Chest - decreased breath sounds, prolonged exhalation, no wheeze Back - No focal tenderness Abd - Soft, non-tender Ext - No edema Neuro - Normal strength Skin - No rashes Psych - Normal mood, and behavior  Impression:  COPD with emphysema. Plan: - will change her to advair and proair >> change to [facility]  Dynamic hyperinflation. Likely made worse by anxiety and tachypnea when she feels anxious. Plan: - continue prn xanax  Lung cancer screening. She has 70 pack yr hx of smoking, and quit in 2008. Plan: - she will need f/u low dose CT chest in July  2017   Chesley Mires, MD  Pulmonary/Critical Care/Sleep Pager:  [phone]

## 2015-02-24 NOTE — Patient Instructions (Signed)
Follow up in 6 months 

## 2015-02-26 ENCOUNTER — Encounter (HOSPITAL_COMMUNITY)
Admission: RE | Admit: 2015-02-26 | Discharge: 2015-02-26 | Disposition: A | Discharge status: Home / Self Care | Payer: Self-pay | Source: Ambulatory Visit | Attending: Pulmonary Disease | Admitting: Pulmonary Disease

## 2015-02-26 DIAGNOSIS — J439 Emphysema, unspecified: Secondary | ICD-10-CM | POA: Insufficient documentation

## 2015-03-03 ENCOUNTER — Encounter (HOSPITAL_COMMUNITY): Payer: Self-pay

## 2015-03-05 ENCOUNTER — Encounter (HOSPITAL_COMMUNITY): Payer: Self-pay

## 2015-03-10 ENCOUNTER — Encounter (HOSPITAL_COMMUNITY)
Admission: RE | Admit: 2015-03-10 | Discharge: 2015-03-10 | Disposition: A | Discharge status: Home / Self Care | Payer: Self-pay | Source: Ambulatory Visit | Attending: Pulmonary Disease | Admitting: Pulmonary Disease

## 2015-03-12 ENCOUNTER — Encounter (HOSPITAL_COMMUNITY): Payer: Self-pay

## 2015-03-12 ENCOUNTER — Other Ambulatory Visit: Payer: Self-pay | Admitting: Pulmonary Disease

## 2015-03-14 ENCOUNTER — Other Ambulatory Visit: Payer: Self-pay | Admitting: Family Medicine

## 2015-03-17 ENCOUNTER — Encounter (HOSPITAL_COMMUNITY): Payer: Self-pay

## 2015-03-19 ENCOUNTER — Encounter (HOSPITAL_COMMUNITY): Payer: Self-pay

## 2015-03-24 ENCOUNTER — Encounter (HOSPITAL_COMMUNITY): Admission: RE | Admit: 2015-03-24 | Payer: Self-pay | Source: Ambulatory Visit

## 2015-03-26 ENCOUNTER — Encounter (HOSPITAL_COMMUNITY)
Admission: RE | Admit: 2015-03-26 | Discharge: 2015-03-26 | Disposition: A | Discharge status: Home / Self Care | Payer: Self-pay | Source: Ambulatory Visit | Attending: Pulmonary Disease | Admitting: Pulmonary Disease

## 2015-03-31 ENCOUNTER — Encounter (HOSPITAL_COMMUNITY): Payer: Medicare Other

## 2015-03-31 DIAGNOSIS — J439 Emphysema, unspecified: Secondary | ICD-10-CM | POA: Insufficient documentation

## 2015-04-02 ENCOUNTER — Encounter (HOSPITAL_COMMUNITY)
Admission: RE | Admit: 2015-04-02 | Discharge: 2015-04-02 | Disposition: A | Discharge status: Home / Self Care | Payer: Self-pay | Source: Ambulatory Visit | Attending: Pulmonary Disease | Admitting: Pulmonary Disease

## 2015-04-07 ENCOUNTER — Encounter (HOSPITAL_COMMUNITY): Payer: Self-pay

## 2015-04-09 ENCOUNTER — Encounter (HOSPITAL_COMMUNITY): Payer: Self-pay

## 2015-04-14 ENCOUNTER — Encounter (HOSPITAL_COMMUNITY)
Admission: RE | Admit: 2015-04-14 | Discharge: 2015-04-14 | Disposition: A | Discharge status: Home / Self Care | Payer: Self-pay | Source: Ambulatory Visit | Attending: Pulmonary Disease | Admitting: Pulmonary Disease

## 2015-04-16 ENCOUNTER — Encounter (HOSPITAL_COMMUNITY)
Admission: RE | Admit: 2015-04-16 | Discharge: 2015-04-16 | Disposition: A | Discharge status: Home / Self Care | Payer: Self-pay | Source: Ambulatory Visit | Attending: Pulmonary Disease | Admitting: Pulmonary Disease

## 2015-04-21 ENCOUNTER — Encounter (HOSPITAL_COMMUNITY): Payer: Self-pay

## 2015-04-23 ENCOUNTER — Encounter (HOSPITAL_COMMUNITY): Payer: Self-pay

## 2015-04-28 ENCOUNTER — Encounter (HOSPITAL_COMMUNITY): Payer: Self-pay

## 2015-04-30 ENCOUNTER — Encounter (HOSPITAL_COMMUNITY)
Admission: RE | Admit: 2015-04-30 | Discharge: 2015-04-30 | Disposition: A | Discharge status: Home / Self Care | Payer: Self-pay | Source: Ambulatory Visit | Attending: Pulmonary Disease | Admitting: Pulmonary Disease

## 2015-04-30 DIAGNOSIS — J439 Emphysema, unspecified: Secondary | ICD-10-CM | POA: Insufficient documentation

## 2015-05-05 ENCOUNTER — Encounter (HOSPITAL_COMMUNITY)
Admission: RE | Admit: 2015-05-05 | Discharge: 2015-05-05 | Disposition: A | Discharge status: Home / Self Care | Payer: Self-pay | Source: Ambulatory Visit | Attending: Pulmonary Disease | Admitting: Pulmonary Disease

## 2015-05-07 ENCOUNTER — Encounter (HOSPITAL_COMMUNITY)
Admission: RE | Admit: 2015-05-07 | Discharge: 2015-05-07 | Disposition: A | Discharge status: Home / Self Care | Payer: Self-pay | Source: Ambulatory Visit | Attending: Pulmonary Disease | Admitting: Pulmonary Disease

## 2015-05-12 ENCOUNTER — Encounter (HOSPITAL_COMMUNITY): Payer: Self-pay

## 2015-05-14 ENCOUNTER — Encounter (HOSPITAL_COMMUNITY): Payer: Self-pay

## 2015-05-15 ENCOUNTER — Ambulatory Visit: Payer: Medicare Other | Admitting: Family Medicine

## 2015-05-19 ENCOUNTER — Encounter (HOSPITAL_COMMUNITY): Payer: Self-pay

## 2015-05-21 ENCOUNTER — Ambulatory Visit (INDEPENDENT_AMBULATORY_CARE_PROVIDER_SITE_OTHER): Payer: Medicare Other | Admitting: Pulmonary Disease

## 2015-05-21 ENCOUNTER — Encounter (HOSPITAL_COMMUNITY): Payer: Self-pay

## 2015-05-21 ENCOUNTER — Encounter: Payer: Self-pay | Admitting: Pulmonary Disease

## 2015-05-21 VITALS — BP 122/70 | HR 89 | Ht 66.5 in | Wt 109.6 lb

## 2015-05-21 DIAGNOSIS — J432 Centrilobular emphysema: Secondary | ICD-10-CM

## 2015-05-21 NOTE — Patient Instructions (Signed)
Follow up in 6 months 

## 2015-05-21 NOTE — Progress Notes (Signed)
Current Outpatient Prescriptions on File Prior to Visit  Medication Sig  . acyclovir (ZOVIRAX) 400 MG tablet Take 1 tablet (400 mg total) by mouth 5 (five) times daily. (Patient taking differently: Take 400 mg by mouth as needed. )  . ADVAIR DISKUS 250-50 MCG/DOSE AEPB INHALE 1 PUFF INTO THE LUNGS TWICE DAILY  . albuterol (PROAIR HFA) 108 (90 BASE) MCG/ACT inhaler Inhale 2 puffs into the lungs every 6 (six) hours as needed for wheezing or shortness of breath.  . ALPRAZolam (XANAX) 0.25 MG tablet TAKE ONE TABLET BY MOUTH 3 TIMES DAILY AS NEEDED FOR ANXIETY  . buPROPion (WELLBUTRIN SR) 150 MG 12 hr tablet TAKE 1 TABLET BY MOUTH TWICE DAILY  . ibuprofen (ADVIL,MOTRIN) 200 MG tablet Take 200 mg by mouth every 6 (six) hours as needed.  Marland Kitchen MELATONIN PO Take by mouth.  . Multiple Vitamin (MULTIVITAMIN) capsule Take 1 capsule by mouth daily.  Marland Kitchen omeprazole (PRILOSEC) 20 MG capsule Take 20 mg by mouth daily.   No current facility-administered medications on file prior to visit.    Chief Complaint  Patient presents with  . Follow-up    pt.states breathing is baseline. occ. SOB. occ. dry cough sometimes prod. white in color. occ. wheezing. lt. side rib pain. chest painX65mo.    Tests Echo 02/08/05 >> EF 40 to 50% CT chest 04/04/09 >> apical centrilobular emphysema, 3 mm RUL nodule, 4 mm RUL nodule Spirometry 05/30/12 >> FEV1 0.69 (26%), FEV1% 40 A1AT 05/30/12 >> 112, PI*MS ONO with RA 07/03/12 >> Test time 8 hrs 33 min.  Mean SpO2 92.6%, low SpO2 87%.  Spent 8 min with SpO2 < 88%.  Past medical history Depression  Past surgical history, allergies, social history, and family history reviewed  Vital signs BP 122/70 mmHg  Pulse 89  Ht 5' 6.5" (1.689 m)  Wt 109 lb 9.6 oz (49.714 kg)  BMI 17.43 kg/m2  SpO2 97%  History of Present Illness: Brandy Russell is a 60 y.o. 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  Her breathing has been okay.  She still has pain in her left chest.  This gets worse  when she turns.  She is not having cough, wheeze, or chest congestion.  She has appt with orthopedics next week to assess her pain symptoms.    Physical Exam:  General - No distress, thin ENT - No sinus tenderness, no oral exudate, no LAN Cardiac - s1s2 regular, no murmur Chest - decreased breath sounds, prolonged exhalation, no wheeze, no dullness Back - No focal tenderness Abd - Soft, non-tender Ext - No edema Neuro - Normal strength Skin - No rashes Psych - Normal mood, and behavior  Impression:  COPD with emphysema. Plan: - will change her to advair and proair >> change to [facility]  Dynamic hyperinflation. Likely made worse by anxiety and tachypnea when she feels anxious. Plan: - continue prn xanax  Lung cancer screening. She has 70 pack yr hx of smoking, and quit in 2008. Plan: - she will need f/u low dose CT chest in July 2017  Vague pain in left shoulder and chest area. Plan: - f/u with PCP and orthopedics   Chesley Mires, MD [place] Pulmonary/Critical Care/Sleep Pager:  [phone]

## 2015-05-26 ENCOUNTER — Encounter (HOSPITAL_COMMUNITY)
Admission: RE | Admit: 2015-05-26 | Discharge: 2015-05-26 | Disposition: A | Discharge status: Home / Self Care | Payer: Self-pay | Source: Ambulatory Visit | Attending: Pulmonary Disease | Admitting: Pulmonary Disease

## 2015-05-28 ENCOUNTER — Encounter (HOSPITAL_COMMUNITY)
Admission: RE | Admit: 2015-05-28 | Discharge: 2015-05-28 | Disposition: A | Discharge status: Home / Self Care | Payer: Self-pay | Source: Ambulatory Visit | Attending: Pulmonary Disease | Admitting: Pulmonary Disease

## 2015-05-28 DIAGNOSIS — J439 Emphysema, unspecified: Secondary | ICD-10-CM | POA: Insufficient documentation

## 2015-06-02 ENCOUNTER — Encounter (HOSPITAL_COMMUNITY)
Admission: RE | Admit: 2015-06-02 | Discharge: 2015-06-02 | Disposition: A | Discharge status: Home / Self Care | Payer: Self-pay | Source: Ambulatory Visit | Attending: Pulmonary Disease | Admitting: Pulmonary Disease

## 2015-06-04 ENCOUNTER — Encounter (HOSPITAL_COMMUNITY): Payer: Self-pay

## 2015-06-04 ENCOUNTER — Telehealth: Payer: Self-pay | Admitting: Pulmonary Disease

## 2015-06-04 MED ORDER — AZITHROMYCIN 250 MG PO TABS: 250.0000 mg | ORAL_TABLET | ORAL | Status: DC

## 2015-06-04 NOTE — Telephone Encounter (Signed)
She likely is coming down with respiratory infection.  Send in script for [place].

## 2015-06-04 NOTE — Telephone Encounter (Signed)
Pt aware of rec's per VS Zpak called into pharmacy Pt asking about cough suppression - aware that she can try OTC Delsym.  If no relief then call our office back. Nothing further needed.

## 2015-06-04 NOTE — Telephone Encounter (Signed)
Pt c/o cough since 05/29/15 and has had low grade fever and some chills. Pt getting up mucus(unsure of color) and chest congestion. Denies head congestion and PND. Pt feels this is allergy related but has tried Benadryl with no relief. Please advise Dr Halford Chessman. Thanks.

## 2015-06-09 ENCOUNTER — Encounter (HOSPITAL_COMMUNITY)
Admission: RE | Admit: 2015-06-09 | Discharge: 2015-06-09 | Disposition: A | Discharge status: Home / Self Care | Payer: Self-pay | Source: Ambulatory Visit | Attending: Pulmonary Disease | Admitting: Pulmonary Disease

## 2015-06-11 ENCOUNTER — Encounter (HOSPITAL_COMMUNITY): Payer: Self-pay

## 2015-06-16 ENCOUNTER — Other Ambulatory Visit: Payer: Self-pay | Admitting: Acute Care

## 2015-06-16 ENCOUNTER — Encounter (HOSPITAL_COMMUNITY): Payer: Self-pay

## 2015-06-16 DIAGNOSIS — Z87891 Personal history of nicotine dependence: Secondary | ICD-10-CM

## 2015-06-18 ENCOUNTER — Encounter (HOSPITAL_COMMUNITY): Payer: Self-pay

## 2015-06-23 ENCOUNTER — Encounter (HOSPITAL_COMMUNITY)
Admission: RE | Admit: 2015-06-23 | Discharge: 2015-06-23 | Disposition: A | Discharge status: Home / Self Care | Payer: Self-pay | Source: Ambulatory Visit | Attending: Pulmonary Disease | Admitting: Pulmonary Disease

## 2015-06-25 ENCOUNTER — Encounter (HOSPITAL_COMMUNITY): Payer: Self-pay

## 2015-06-30 ENCOUNTER — Encounter (HOSPITAL_COMMUNITY): Payer: Self-pay | Attending: Pulmonary Disease

## 2015-06-30 DIAGNOSIS — J439 Emphysema, unspecified: Secondary | ICD-10-CM | POA: Insufficient documentation

## 2015-07-02 ENCOUNTER — Encounter (HOSPITAL_COMMUNITY): Payer: Self-pay

## 2015-07-06 ENCOUNTER — Other Ambulatory Visit: Payer: Self-pay | Admitting: Pulmonary Disease

## 2015-07-07 ENCOUNTER — Encounter (HOSPITAL_COMMUNITY): Payer: Self-pay

## 2015-07-09 ENCOUNTER — Encounter (HOSPITAL_COMMUNITY): Payer: Self-pay

## 2015-07-13 ENCOUNTER — Telehealth: Payer: Self-pay | Admitting: Pulmonary Disease

## 2015-07-13 MED ORDER — AZITHROMYCIN 250 MG PO TABS: ORAL_TABLET | ORAL | Status: DC

## 2015-07-13 NOTE — Telephone Encounter (Signed)
Called spoke with patient who reported that she did well with the Zpak sent on 3.9.17 (via phone note) but about 2 weeks after that her cough returned.  Patient stated that her cough "sounds nasty", it is loose and wet and she produces clear mucus.  Patient denies any wheezing, tightness, f/c/s, hemoptysis.  She questions allergies.  Pt stated that she doesn't "feel bad", she just doesn't want the cough/mucus to "harm her lungs".  In addition to the Zpak rx'd by this office, she has also been taking Mucinex DM and Deslym.  Dr Halford Chessman please advise, thank you.  Per 2.23.17 ov w/ VS: Patient Instructions       Follow up in 6 months

## 2015-07-13 NOTE — Telephone Encounter (Signed)
Will send to Dr. Lake Bells as Dr. Halford Chessman is unavailable this afternoon.   Dr. Lake Bells please advise. Thanks.

## 2015-07-13 NOTE — Telephone Encounter (Signed)
Zpack again, if no improvement then let us know

## 2015-07-13 NOTE — Telephone Encounter (Signed)
Called and spoke to pt. Informed her of the recs per BQ. Rx sent to preferred pharmacy. Pt verbalized understanding and denied any further questions or concerns at this time.   

## 2015-07-14 ENCOUNTER — Encounter (HOSPITAL_COMMUNITY): Payer: Self-pay | Admitting: *Deleted

## 2015-07-14 ENCOUNTER — Encounter (HOSPITAL_COMMUNITY): Payer: Self-pay

## 2015-07-14 NOTE — Progress Notes (Signed)
Brandy Russell has not been to the Pulmonary Maintenance at all in April. I have not received any telephone calls from her. I contacted her today. She has had bronchitis and is now on the third antibiotic. We discussed her attendance and she has decided to drop out of the program. I will discharge her today.

## 2015-07-16 ENCOUNTER — Encounter (HOSPITAL_COMMUNITY): Payer: Self-pay

## 2015-07-21 ENCOUNTER — Encounter (HOSPITAL_COMMUNITY): Payer: Self-pay

## 2015-07-22 ENCOUNTER — Ambulatory Visit (INDEPENDENT_AMBULATORY_CARE_PROVIDER_SITE_OTHER): Payer: Medicare Other | Admitting: Pulmonary Disease

## 2015-07-22 ENCOUNTER — Ambulatory Visit (INDEPENDENT_AMBULATORY_CARE_PROVIDER_SITE_OTHER)
Admission: RE | Admit: 2015-07-22 | Discharge: 2015-07-22 | Disposition: A | Discharge status: Home / Self Care | Payer: Medicare Other | Source: Ambulatory Visit | Attending: Pulmonary Disease | Admitting: Pulmonary Disease

## 2015-07-22 ENCOUNTER — Encounter: Payer: Self-pay | Admitting: Pulmonary Disease

## 2015-07-22 VITALS — BP 108/70 | HR 90 | Ht 67.0 in | Wt 109.8 lb

## 2015-07-22 DIAGNOSIS — R053 Chronic cough: Secondary | ICD-10-CM

## 2015-07-22 DIAGNOSIS — R05 Cough: Secondary | ICD-10-CM

## 2015-07-22 DIAGNOSIS — J432 Centrilobular emphysema: Secondary | ICD-10-CM | POA: Diagnosis not present

## 2015-07-22 MED ORDER — NYSTATIN 100000 UNIT/ML MT SUSP: 500000.0000 [IU] | Freq: Four times a day (QID) | OROMUCOSAL | Status: DC

## 2015-07-22 NOTE — Progress Notes (Signed)
Current Outpatient Prescriptions on File Prior to Visit  Medication Sig  . acyclovir (ZOVIRAX) 400 MG tablet Take 1 tablet (400 mg total) by mouth 5 (five) times daily. (Patient taking differently: Take 400 mg by mouth as needed. )  . ADVAIR DISKUS 250-50 MCG/DOSE AEPB INHALE 1 PUFF INTO THE LUNGS TWICE DAILY  . albuterol (PROAIR HFA) 108 (90 BASE) MCG/ACT inhaler Inhale 2 puffs into the lungs every 6 (six) hours as needed for wheezing or shortness of breath.  . ALPRAZolam (XANAX) 0.25 MG tablet TAKE 1 TABLET BY MOUTH THREE TIMES DAILY AS NEEDED FOR ANXIETY  . buPROPion (WELLBUTRIN SR) 150 MG 12 hr tablet TAKE 1 TABLET BY MOUTH TWICE DAILY  . ibuprofen (ADVIL,MOTRIN) 200 MG tablet Take 200 mg by mouth every 6 (six) hours as needed.  Marland Kitchen omeprazole (PRILOSEC) 20 MG capsule Take 20 mg by mouth 2 (two) times daily before a meal.    No current facility-administered medications on file prior to visit.    Chief Complaint  Patient presents with  . Follow-up    Pt reports still having cough after 3 rounds of Prednisone and 2 rounds of antibiotics. Pt describes cough as being very wet and clear mucus. Reports SOB and using Albuterol HFA more frequently.     Tests Echo 02/08/05 >> EF 40 to 50% CT chest 04/04/09 >> apical centrilobular emphysema, 3 mm RUL nodule, 4 mm RUL nodule Spirometry 05/30/12 >> FEV1 0.69 (26%), FEV1% 40 A1AT 05/30/12 >> 112, PI*MS ONO with RA 07/03/12 >> Test time 8 hrs 33 min.  Mean SpO2 92.6%, low SpO2 87%.  Spent 8 min with SpO2 < 88%. CT chest screening 10/03/14 >> multiple pulmonary nodules up to 5 mm, mild centrilobular and paraseptal emphysema  Past medical history Depression  Past surgical history, allergies, social history, and family history reviewed  Vital signs BP 108/70 mmHg  Pulse 90  Ht 5\' 7"  (1.702 m)  Wt 109 lb 12.8 oz (49.805 kg)  BMI 17.19 kg/m2  SpO2 98%  History of Present Illness: Brandy Russell is a 60 y.o. female former smoker with  COPD/emphysema.  She continues to have cough.  She usually brings up clear sputum.  She has a very wet cough.  She is not having wheeze, or fever.  It has been difficulty for her to keep up her weight.  She has noticed her reflux was getting worse.  She increase her omeprazole to twice per day 2 weeks ago >> reflux better, but cough and sputum continue.  She denies skin rash, chest pain, or leg swelling.  She feels her inhalers help some.  She has been on several rounds of prednisone and antibiotics >> no real difference.  She has tried adjusting her diet to assist with reducing reflux, and she tried elevating head of her bed >> sleeping on more pillows.   Physical Exam:  General - No distress, thin ENT - No sinus tenderness, white exudate on posterior pharynx and hard palate, no LAN Cardiac - s1s2 regular, no murmur Chest - decreased breath sounds, prolonged exhalation, no wheeze, no dullness Back - No focal tenderness Abd - Soft, non-tender Ext - No edema Neuro - Normal strength Skin - No rashes Psych - Normal mood, and behavior  Impression:  Chronic productive cough. - will get chest xray and sample of sputum for culture and AFB  COPD with emphysema. - continue advair, and prn albuterol - discussed importance of rinse her mouth after using ICS  Dynamic  hyperinflation. - continue prn xanax  Lung cancer screening. - she will need f/u low dose CT chest in July 2018  GERD. - continue bid omeprazole and f/u with PCP - discussed reflux diet, and how to elevated her bed   Patient Instructions  Nystatin 5 ml swish and swallow 4 times per day for 5 days  Chest xray today  Will arrange for tests of your phlegm sample  Follow up in 2 to 3 weeks with Dr. Halford Chessman or Nurse practitioner      Chesley Mires, MD [place] [facility]/Sleep Pager:  [phone] 07/22/2015, 3:53 PM

## 2015-07-22 NOTE — Patient Instructions (Signed)
Nystatin 5 ml swish and swallow 4 times per day for 5 days  Chest xray today  Will arrange for tests of your phlegm sample  Follow up in 2 to 3 weeks with Dr. Halford Chessman or Nurse practitioner

## 2015-07-23 ENCOUNTER — Other Ambulatory Visit: Payer: Self-pay

## 2015-07-23 ENCOUNTER — Encounter (HOSPITAL_COMMUNITY): Payer: Self-pay

## 2015-07-23 ENCOUNTER — Telehealth: Payer: Self-pay | Admitting: Pulmonary Disease

## 2015-07-23 DIAGNOSIS — R05 Cough: Secondary | ICD-10-CM

## 2015-07-23 DIAGNOSIS — R053 Chronic cough: Secondary | ICD-10-CM

## 2015-07-23 DIAGNOSIS — R042 Hemoptysis: Secondary | ICD-10-CM

## 2015-07-23 NOTE — Telephone Encounter (Signed)
Dg Chest 2 View  07/22/2015  CLINICAL DATA:  Cough and congestion. EXAM: CHEST  2 VIEW COMPARISON:  CT 10/03/2014 .  09/24/2014. FINDINGS: Mediastinum and hilar structures normal. Lungs are clear. Heart size normal. Bilateral pleural thickening again noted consistent with scarring. Bilateral nipple shadows noted. No pneumothorax. No acute bony abnormality. Degenerative changes cervical thoracic spine . IMPRESSION: 1. Bilateral pleural parenchymal thickening noted consistent with scarring. 2. No acute pulmonary disease. Electronically Signed   By: Marcello Moores  Register   On: 07/22/2015 16:08     Will have my nurse inform pt that CXR shows expected changes from COPD.  No other worrisome findings.

## 2015-07-24 NOTE — Telephone Encounter (Signed)
Results have been explained to patient, pt expressed understanding. Nothing further needed.  

## 2015-07-26 LAB — RESPIRATORY CULTURE OR RESPIRATORY AND SPUTUM CULTURE: Gram Stain: NONE SEEN

## 2015-07-27 ENCOUNTER — Telehealth: Payer: Self-pay | Admitting: Pulmonary Disease

## 2015-07-27 MED ORDER — FLUCONAZOLE 100 MG PO TABS: ORAL_TABLET | ORAL | Status: DC

## 2015-07-27 MED ORDER — CIPROFLOXACIN HCL 500 MG PO TABS: 500.0000 mg | ORAL_TABLET | Freq: Two times a day (BID) | ORAL | Status: DC

## 2015-07-27 NOTE — Telephone Encounter (Signed)
Recent Results (from the past 240 hour(s))  Respiratory or Resp and Sputum Culture     Status: None   Collection Time: 07/23/15  2:43 PM  Result Value Ref Range Status   Culture Abundant PSEUDOMONAS AERUGINOSA  Final   Gram Stain No WBC Seen  Final   Gram Stain Rare Squamous Epithelial Cells Present  Final   Gram Stain Abundant Gram Negative Rods  Final   Gram Stain   Final    Few Gram Positive Cocci In Pairs This specimen is acceptable for Sputum Culture   Organism ID, Bacteria PSEUDOMONAS AERUGINOSA  Final      Susceptibility   Pseudomonas aeruginosa -  (no method available)    PIP/TAZO 8 Sensitive     IMIPENEM 2 Sensitive     CEFTAZIDIME 4 Sensitive     CEFEPIME 2 Sensitive     GENTAMICIN <=1 Sensitive     TOBRAMYCIN <=1 Sensitive     CIPROFLOXACIN <=0.25 Sensitive     LEVOFLOXACIN 0.5 Sensitive     Results d/w pt.  Will send script for cipro 500 mg bid for 7 days.  She continues to have thrush in spite of using nystatin.  Will send script fluconazole 200 mg on day one, then 100 mg daily for next 6 days.

## 2015-07-28 ENCOUNTER — Encounter (HOSPITAL_COMMUNITY): Payer: Self-pay

## 2015-07-30 ENCOUNTER — Encounter (HOSPITAL_COMMUNITY): Payer: Self-pay

## 2015-08-04 ENCOUNTER — Encounter (HOSPITAL_COMMUNITY): Payer: Self-pay

## 2015-08-05 ENCOUNTER — Ambulatory Visit (INDEPENDENT_AMBULATORY_CARE_PROVIDER_SITE_OTHER): Payer: Medicare Other | Admitting: Adult Health

## 2015-08-05 ENCOUNTER — Encounter: Payer: Self-pay | Admitting: Adult Health

## 2015-08-05 VITALS — BP 108/64 | HR 71 | Temp 97.9°F | Ht 67.0 in | Wt 108.0 lb

## 2015-08-05 DIAGNOSIS — J439 Emphysema, unspecified: Secondary | ICD-10-CM

## 2015-08-05 NOTE — Patient Instructions (Signed)
Continue on current regimen  Follow up with Dr. Halford Chessman  In 3 months as planned

## 2015-08-05 NOTE — Progress Notes (Signed)
Subjective:    Patient ID: Brandy Russell, female    DOB: 25-Jul-1955, 60 y.o.   MRN: [id number]  HPI 60 year old female former smoker with COPD and emphysema  Tests Echo 02/08/05 >> EF 40 to 50% CT chest 04/04/09 >> apical centrilobular emphysema, 3 mm RUL nodule, 4 mm RUL nodule Spirometry 05/30/12 >> FEV1 0.69 (26%), FEV1% 40 A1AT 05/30/12 >> 112, PI*MS ONO with RA 07/03/12 >> Test time 8 hrs 33 min. Mean SpO2 92.6%, low SpO2 87%. Spent 8 min with SpO2 < 88%. CT chest screening 10/03/14 >> multiple pulmonary nodules up to 5 mm, mild centrilobular and paraseptal emphysema  08/05/2015 follow-up ; COPD  Patient returns for a two-week follow-up. Patient was having a slow to resolve COPD exacerbation. Chest x-ray showed chronic changes without acute process noted Sputum cultures returned with a positive Pseudomonas-pansensitive  Patient was started on a seven-day course of Cipro. AFB culture preliminaries are negative. She says that she is improved with decreased cough. Feels that her cough is mainly dry. He has decreased mucus production. Overall feels that she is slightly improved. She denies any hemoptysis, chest pain, orthopnea, PND, or increased leg swelling.Marland Kitchen Appetite is starting to improve.. Reflux is better controlled on Prilosec.   Past Medical History  Diagnosis Date  . Anxiety and depression   . Emphysema   . Seasonal allergies   . CAD (coronary artery disease)     incidental finding on CT scan for lung ca screening  . Constipation   . Herpes     face, uses suppresive therapy intermittently   Current Outpatient Prescriptions on File Prior to Visit  Medication Sig Dispense Refill  . acyclovir (ZOVIRAX) 400 MG tablet Take 1 tablet (400 mg total) by mouth 5 (five) times daily. (Patient taking differently: Take 400 mg by mouth as needed. ) 60 tablet 3  . ADVAIR DISKUS 250-50 MCG/DOSE AEPB INHALE 1 PUFF INTO THE LUNGS TWICE DAILY 1 each 5  . albuterol (PROAIR HFA) 108 (90  BASE) MCG/ACT inhaler Inhale 2 puffs into the lungs every 6 (six) hours as needed for wheezing or shortness of breath. 1 Inhaler 3  . ALPRAZolam (XANAX) 0.25 MG tablet TAKE 1 TABLET BY MOUTH THREE TIMES DAILY AS NEEDED FOR ANXIETY 90 tablet 1  . buPROPion (WELLBUTRIN SR) 150 MG 12 hr tablet TAKE 1 TABLET BY MOUTH TWICE DAILY 180 tablet 4  . ibuprofen (ADVIL,MOTRIN) 200 MG tablet Take 200 mg by mouth every 6 (six) hours as needed.    Marland Kitchen omeprazole (PRILOSEC) 20 MG capsule Take 20 mg by mouth 2 (two) times daily before a meal.      No current facility-administered medications on file prior to visit.     Review of Systems Constitutional:   No  weight loss, night sweats,  Fevers, chills, fatigue, or  lassitude.  HEENT:   No headaches,  Difficulty swallowing,  Tooth/dental problems, or  Sore throat,                No sneezing, itching, ear ache, nasal congestion, post nasal drip,   CV:  No chest pain,  Orthopnea, PND, swelling in lower extremities, anasarca, dizziness, palpitations, syncope.   GI  No abdominal pain, nausea, vomiting, diarrhea, change in bowel habits, loss of appetite, bloody stools.   Resp:   No chest wall deformity  Skin: no rash or lesions.  GU: no dysuria, change in color of urine, no urgency or frequency.  No flank pain, no hematuria  MS:  No joint pain or swelling.  No decreased range of motion.  No back pain.  Psych:  No change in mood or affect. No depression or anxiety.  No memory loss.         Objective:   Physical Exam  Filed Vitals:   08/05/15 1047  BP: 108/64  Pulse: 71  Temp: 97.9 F (36.6 C)  TempSrc: Oral  Height: 5\' 7"  (1.702 m)  Weight: 108 lb (48.988 kg)  SpO2: 97%   GEN: A/Ox3; pleasant , NAD, thin and frail   HEENT:  [place]/AT,  EACs-clear, TMs-wnl, NOSE-clear, THROAT-clear, no lesions, no postnasal drip or exudate noted.   NECK:  Supple w/ fair ROM; no JVD; normal carotid impulses w/o bruits; no thyromegaly or nodules palpated; no  lymphadenopathy.  RESP  Diminshed BS in bases , no accessory muscle use, no dullness to percussion  CARD:  RRR, no m/r/g  , no peripheral edema, pulses intact, no cyanosis or clubbing.  GI:   Soft & nt; nml bowel sounds; no organomegaly or masses detected.  Musco: Warm bil, no deformities or joint swelling noted.   Neuro: alert, no focal deficits noted.    Skin: Warm, no lesions or rashes  [name] Parrett NP-C  [facility]  08/05/2015       Assessment & Plan:

## 2015-08-05 NOTE — Assessment & Plan Note (Signed)
Slow to resolve COPD exacerbation with Pseudomonas bronchitis Patient is clinically improved after Cipro She is continue on Advair. Use Mucinex as needed. Follow-up in 3 months as planned and As needed   .Please contact office for sooner follow up if symptoms do not improve or worsen or seek emergency care

## 2015-08-05 NOTE — Progress Notes (Signed)
Reviewed and agree with assessment/plan.  Chesley Mires, MD [facility]/Critical Care 08/05/2015, 2:31 PM Pager:  [phone]

## 2015-08-06 ENCOUNTER — Encounter (HOSPITAL_COMMUNITY): Payer: Self-pay

## 2015-08-11 ENCOUNTER — Encounter (HOSPITAL_COMMUNITY): Payer: Self-pay

## 2015-08-13 ENCOUNTER — Encounter (HOSPITAL_COMMUNITY): Payer: Self-pay

## 2015-08-18 ENCOUNTER — Encounter (HOSPITAL_COMMUNITY): Payer: Self-pay

## 2015-08-20 ENCOUNTER — Encounter (HOSPITAL_COMMUNITY): Payer: Self-pay

## 2015-08-21 ENCOUNTER — Ambulatory Visit: Payer: Self-pay | Admitting: Pulmonary Disease

## 2015-08-25 ENCOUNTER — Encounter (HOSPITAL_COMMUNITY): Payer: Self-pay

## 2015-08-27 ENCOUNTER — Encounter (HOSPITAL_COMMUNITY): Payer: Self-pay

## 2015-08-31 ENCOUNTER — Other Ambulatory Visit: Payer: Self-pay | Admitting: Pulmonary Disease

## 2015-09-01 ENCOUNTER — Encounter (HOSPITAL_COMMUNITY): Payer: Self-pay

## 2015-09-03 ENCOUNTER — Encounter (HOSPITAL_COMMUNITY): Payer: Self-pay

## 2015-09-08 ENCOUNTER — Encounter (HOSPITAL_COMMUNITY): Payer: Self-pay

## 2015-09-08 LAB — AFB CULTURE WITH SMEAR (NOT AT ARMC)

## 2015-09-10 ENCOUNTER — Encounter (HOSPITAL_COMMUNITY): Payer: Self-pay

## 2015-09-15 ENCOUNTER — Encounter (HOSPITAL_COMMUNITY): Payer: Self-pay

## 2015-09-17 ENCOUNTER — Encounter (HOSPITAL_COMMUNITY): Payer: Self-pay

## 2015-09-18 ENCOUNTER — Telehealth: Payer: Self-pay | Admitting: Pulmonary Disease

## 2015-09-18 NOTE — Telephone Encounter (Signed)
Results for orders placed or performed in visit on 07/23/15  AFB Culture & Smear     Status: None   Collection Time: 07/23/15  2:41 PM  Result Value Ref Range Status   Source: ;  Final   Acid Fast Bacilli (AFB) Culture    Final    Comment:   MYCOBACTERIA, CULTURE, WITH FLUOROCHROME SMEAR       MICRO NUMBER:      [id number]   TEST STATUS:       FINAL   SPECIMEN SOURCE:   SPUTUM   SPECIMEN QUALITY:  ADEQUATE   SMEAR:             No acid fast bacilli seen.   RESULT:            No Mycobacterium species isolated after                      6 weeks incubation.      Will have my nurse inform pt that sputum culture for mycobacterial infections was negative.  No change to current treatment plan.

## 2015-09-22 ENCOUNTER — Encounter (HOSPITAL_COMMUNITY): Payer: Self-pay

## 2015-09-23 ENCOUNTER — Other Ambulatory Visit: Payer: Self-pay | Admitting: Pulmonary Disease

## 2015-09-23 NOTE — Telephone Encounter (Signed)
Pt is requesting a phone call from Dr Halford Chessman upon his return to office. Called to explain final results of AFB Sputum sample and the patient became very upset stating that she has already received these results back in April and was treated with an abx. The patient would not let me explain the difference between the two results/cultures and the fact that we did different types of cultures so the results may vary based on what we were testing for. I also tried to explain that some cultures take several weeks for the results to come back. Pt refused to listen when I tried to explain that the previous results was from one culture and this result is from another culture - pt stated she didn't want to worry about it and wanted a call when VS returned.    Please advise Dr Halford Chessman. Thanks.

## 2015-09-24 ENCOUNTER — Encounter (HOSPITAL_COMMUNITY): Payer: Self-pay

## 2015-09-29 ENCOUNTER — Encounter (HOSPITAL_COMMUNITY): Payer: Self-pay

## 2015-10-01 ENCOUNTER — Encounter (HOSPITAL_COMMUNITY): Payer: Self-pay

## 2015-10-06 ENCOUNTER — Encounter (HOSPITAL_COMMUNITY): Payer: Self-pay

## 2015-10-06 ENCOUNTER — Ambulatory Visit (INDEPENDENT_AMBULATORY_CARE_PROVIDER_SITE_OTHER)
Admission: RE | Admit: 2015-10-06 | Discharge: 2015-10-06 | Disposition: A | Discharge status: Home / Self Care | Payer: Medicare Other | Source: Ambulatory Visit | Attending: Acute Care | Admitting: Acute Care

## 2015-10-06 DIAGNOSIS — Z87891 Personal history of nicotine dependence: Secondary | ICD-10-CM | POA: Diagnosis not present

## 2015-10-08 ENCOUNTER — Encounter (HOSPITAL_COMMUNITY): Payer: Self-pay

## 2015-10-13 ENCOUNTER — Encounter (HOSPITAL_COMMUNITY): Payer: Self-pay

## 2015-10-15 ENCOUNTER — Encounter (HOSPITAL_COMMUNITY): Payer: Self-pay

## 2015-10-15 NOTE — Telephone Encounter (Signed)
Spoke with pt.  Explained that she had two separate cultures sent >> first was routine culture and second was mycobacterial culture.  She understands better now.  No additional interventions needed at this time.

## 2015-10-20 ENCOUNTER — Encounter (HOSPITAL_COMMUNITY): Payer: Self-pay

## 2015-10-22 ENCOUNTER — Encounter (HOSPITAL_COMMUNITY): Payer: Self-pay

## 2015-10-27 ENCOUNTER — Encounter (HOSPITAL_COMMUNITY): Payer: Self-pay

## 2015-10-29 ENCOUNTER — Encounter (HOSPITAL_COMMUNITY): Payer: Self-pay

## 2015-11-03 ENCOUNTER — Encounter (HOSPITAL_COMMUNITY): Payer: Self-pay

## 2015-11-05 ENCOUNTER — Encounter (HOSPITAL_COMMUNITY): Payer: Self-pay

## 2015-11-10 ENCOUNTER — Encounter (HOSPITAL_COMMUNITY): Payer: Self-pay

## 2015-11-12 ENCOUNTER — Telehealth: Payer: Self-pay | Admitting: Pulmonary Disease

## 2015-11-12 ENCOUNTER — Encounter (HOSPITAL_COMMUNITY): Payer: Self-pay

## 2015-11-12 MED ORDER — FIRST-DUKES MOUTHWASH MT SUSP: OROMUCOSAL | 1 refills | Status: DC

## 2015-11-12 NOTE — Telephone Encounter (Signed)
Called and spoke to pt. Informed her of the recs per CY. Rx sent to preferred pharmacy. Pt verbalized understanding and denied any further questions or concerns at this time.   

## 2015-11-12 NOTE — Telephone Encounter (Signed)
Dr. Halford Chessman was NF MD last night and tonight. Will forward to Dr. Annamaria Boots DOD. Please advise thanks

## 2015-11-12 NOTE — Telephone Encounter (Signed)
Spoke with pt. She states that she has thrush. Reports white spots on her tongue and on the back of her throat. Symptoms started about 1 week ago. Pt would like a prescription for MMW.  VS - please advise. Thanks.

## 2015-11-12 NOTE — Telephone Encounter (Signed)
Ok to offer [employer] Mouthwash     150 ml,   Swish and swallow 5 ml  4 times daily, ref x 1

## 2015-11-17 ENCOUNTER — Encounter (HOSPITAL_COMMUNITY): Payer: Self-pay

## 2015-11-17 ENCOUNTER — Encounter: Payer: Self-pay | Admitting: Pulmonary Disease

## 2015-11-17 ENCOUNTER — Ambulatory Visit (INDEPENDENT_AMBULATORY_CARE_PROVIDER_SITE_OTHER): Payer: Medicare Other | Admitting: Pulmonary Disease

## 2015-11-17 VITALS — BP 138/82 | HR 81 | Ht 67.0 in | Wt 113.2 lb

## 2015-11-17 DIAGNOSIS — F458 Other somatoform disorders: Secondary | ICD-10-CM

## 2015-11-17 DIAGNOSIS — Z23 Encounter for immunization: Secondary | ICD-10-CM | POA: Diagnosis not present

## 2015-11-17 DIAGNOSIS — R1310 Dysphagia, unspecified: Secondary | ICD-10-CM | POA: Diagnosis not present

## 2015-11-17 DIAGNOSIS — J432 Centrilobular emphysema: Secondary | ICD-10-CM | POA: Diagnosis not present

## 2015-11-17 NOTE — Progress Notes (Signed)
Current Outpatient Prescriptions on File Prior to Visit  Medication Sig  . acyclovir (ZOVIRAX) 400 MG tablet Take 1 tablet (400 mg total) by mouth 5 (five) times daily. (Patient taking differently: Take 400 mg by mouth as needed. )  . ADVAIR DISKUS 250-50 MCG/DOSE AEPB INHALE 1 PUFF INTO THE LUNGS TWICE DAILY  . ALPRAZolam (XANAX) 0.25 MG tablet TAKE 1 TABLET BY MOUTH THREE TIMES DAILY AS NEEDED FOR ANXIETY  . buPROPion (WELLBUTRIN SR) 150 MG 12 hr tablet TAKE 1 TABLET BY MOUTH TWICE DAILY  . Diphenhyd-Hydrocort-Nystatin (FIRST-DUKES MOUTHWASH) SUSP Take 1 tsp and swish and swallow 4 times daily  . ibuprofen (ADVIL,MOTRIN) 200 MG tablet Take 200 mg by mouth every 6 (six) hours as needed.  Marland Kitchen omeprazole (PRILOSEC) 20 MG capsule Take 20 mg by mouth 2 (two) times daily before a meal.   . PROAIR HFA 108 (90 Base) MCG/ACT inhaler INHALE 2 PUFFS INTO THE LUNGS EVERY 6 HOURS AS NEEDED FOR WHEEZING OR SHORTNESS OF BREATH   No current facility-administered medications on file prior to visit.     Chief Complaint  Patient presents with  . Follow-up    Pt notes increased breathing issues with hot, humid weather.     Pulmonary tests CT chest 04/04/09 >> apical centrilobular emphysema, 3 mm RUL nodule, 4 mm RUL nodule Spirometry 05/30/12 >> FEV1 0.69 (26%), FEV1% 40 A1AT 05/30/12 >> 112, PI*MS ONO with RA 07/03/12 >> Test time 8 hrs 33 min.  Mean SpO2 92.6%, low SpO2 87%.  Spent 8 min with SpO2 < 88%. Low dose CT chest 10/03/14 >> multiple pulmonary nodules up to 5 mm, mild centrilobular and paraseptal emphysema Sputum 07/23/15 >> Pseudomonas aeruginosa Low dose CT chest 10/06/15 >> atherosclerosis, mod centrilobular emphysema, bronchial wall thickening, scattered nodules up to 3.6 mm  Cardiac tests Echo 02/08/05 >> EF 40 to 50%  Past medical history Depression, GERD  Past surgical history, allergies, social history, and family history reviewed  Vital signs BP 138/82 (BP Location: Left Arm, Cuff  Size: Normal)   Pulse 81   Ht 5\' 7"  (1.702 m)   Wt 113 lb 3.2 oz (51.3 kg)   SpO2 99%   BMI 17.73 kg/m   History of Present Illness: Brandy Russell is a 60 y.o. 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  Her cough and sputum have improved since therapy for Pseudomonal respiratory infection.  She is not having much wheeze.  Gets intermittent chest pain.  Denies leg swelling.  Gets occasional leg cramps.  She had to stop pulmonary rehab.  She would get episodes of swallowing air into her stomach.  She would get terribly bloated and have to force herself to belch.  She also gets episodes of constipation.  She sometimes feels like she is swallowing air when she eats.  If she holds onto her neck when she swallows and times her swallowing with her breathing, then she doesn't get as bloated.  Physical Exam:  General - No distress, thin ENT - No sinus tenderness, no oral exudate, no LAN Cardiac - s1s2 regular, no murmur Chest - decreased breath sounds, prolonged exhalation, no wheeze, no dullness Back - No focal tenderness Abd - Soft, non-tender Ext - No edema Neuro - Normal strength Skin - No rashes Psych - Normal mood, and behavior  Impression:  COPD with emphysema. - continue advair, and prn albuterol - prevnar shot today  Dynamic hyperinflation. - continue prn xanax  Lung cancer screening. - she will need f/u low  dose CT chest in July 2018  GERD. Aerophagia, dysphagia. - continue bid omeprazole and f/u with PCP - discussed reflux diet, and how to elevated her bed - will arrange for esophagram and referral to speech therapy - might need assessment by gastroenterology   Patient Instructions  Prevnar shot today  Will arrange for esophagram  Will arrange for referral to speech therapy  Follow up in 6 months   Chesley Mires, MD [place] [facility]/Sleep Pager:  [phone] 11/17/2015, 10:12 AM

## 2015-11-17 NOTE — Addendum Note (Signed)
Addended by: Virl Cagey on: 11/17/2015 10:53 AM   Modules accepted: Orders

## 2015-11-17 NOTE — Patient Instructions (Signed)
Prevnar shot today  Will arrange for esophagram  Will arrange for referral to speech therapy  Follow up in 6 months

## 2015-11-18 ENCOUNTER — Telehealth: Payer: Self-pay | Admitting: Pulmonary Disease

## 2015-11-18 NOTE — Telephone Encounter (Signed)
Spoke with Baxter Flattery at [facility] radiology, aware of recs.  Nothing further needed.

## 2015-11-18 NOTE — Telephone Encounter (Signed)
Spoke with Brandy Russell at [facility] radiology  Pt is scheduled for water soluble esophogram this wk  She wanted to be sure that this is what VS wanted or if should be a conventional esophogram  Please advise thanks!

## 2015-11-18 NOTE — Telephone Encounter (Signed)
Water soluble esophagram is okay.

## 2015-11-19 ENCOUNTER — Encounter (HOSPITAL_COMMUNITY): Payer: Self-pay

## 2015-11-20 ENCOUNTER — Ambulatory Visit (HOSPITAL_COMMUNITY)
Admission: RE | Admit: 2015-11-20 | Discharge: 2015-11-20 | Disposition: A | Discharge status: Home / Self Care | Payer: Medicare Other | Source: Ambulatory Visit | Attending: Pulmonary Disease | Admitting: Pulmonary Disease

## 2015-11-20 DIAGNOSIS — R1310 Dysphagia, unspecified: Secondary | ICD-10-CM | POA: Diagnosis not present

## 2015-11-20 DIAGNOSIS — F458 Other somatoform disorders: Secondary | ICD-10-CM

## 2015-11-20 MED ORDER — IOPAMIDOL (ISOVUE-300) INJECTION 61%
INTRAVENOUS | Status: AC
  Administered 2015-11-20: 150 mL
  Filled 2015-11-20: qty 150

## 2015-11-20 MED ORDER — IOPAMIDOL (ISOVUE-300) INJECTION 61%
150.0000 mL | Freq: Once | INTRAVENOUS | Status: AC | PRN
  Administered 2015-11-20: 150 mL

## 2015-11-20 MED ORDER — IOPAMIDOL (ISOVUE-300) INJECTION 61%
INTRAVENOUS | Status: AC
  Filled 2015-11-20: qty 150

## 2015-11-24 ENCOUNTER — Encounter (HOSPITAL_COMMUNITY): Payer: Self-pay

## 2015-11-25 ENCOUNTER — Telehealth: Payer: Self-pay | Admitting: Pulmonary Disease

## 2015-11-25 NOTE — Telephone Encounter (Signed)
Dg Esophagus W/water Raelene Bott Cm  Result Date: 11/20/2015 CLINICAL DATA:  Aerophagia.  Dysphagia. EXAM: ESOPHOGRAM/BARIUM SWALLOW TECHNIQUE: Single contrast examination was performed using water-soluble contrast. FLUOROSCOPY TIME:  Radiation Exposure Index (as provided by the fluoroscopic device): 65 microGy*m^2 If the device does not provide the exposure index: Fluoroscopy Time:  dictate in minutes and seconds Number of Acquired Images: COMPARISON:  10/06/2015 chest CT FINDINGS: As requested, today's esophagram was performed with water-soluble contrast, 150 cc Isovue-300. The patient was initially distraught and complained of agitation related to hypoglycemia. We administered some orange juice to the patient which allowed her to calm down and which relieved her agitation. Primary peristaltic waves in the esophagus were intact on 3 out of 4 swallows. No esophageal stricture or ulceration identified. No leak noted. A 13 mm barium tablet initially extended to the distal esophagus and paused, but after about 30 seconds and with swallows of water passed into the stomach. IMPRESSION: 1. No specific esophageal abnormality is identified to explain the patient's symptoms. Electronically Signed   By: Van Clines M.D.   On: 11/20/2015 11:38    Will have my nurse inform pt that esophagus xray was normal.  If her symptoms of bloating and belching persist, she should d/w her PCP.

## 2015-11-27 NOTE — Telephone Encounter (Signed)
Results have been explained to patient, pt expressed understanding.  Pt aware that the referral was placed for Speech Pathologist - pt aware that she can call [phone].  Nothing further needed.

## 2015-12-03 ENCOUNTER — Other Ambulatory Visit: Payer: Self-pay | Admitting: Pulmonary Disease

## 2015-12-07 ENCOUNTER — Other Ambulatory Visit: Payer: Self-pay | Admitting: Adult Health

## 2015-12-07 ENCOUNTER — Other Ambulatory Visit: Payer: Self-pay | Admitting: Pulmonary Disease

## 2015-12-07 MED ORDER — ALPRAZOLAM 0.25 MG PO TABS: 0.2500 mg | ORAL_TABLET | Freq: Three times a day (TID) | ORAL | 0 refills | Status: DC | PRN

## 2016-01-15 ENCOUNTER — Ambulatory Visit: Payer: Medicare Other

## 2016-01-18 ENCOUNTER — Encounter: Payer: Self-pay | Admitting: *Deleted

## 2016-01-18 ENCOUNTER — Ambulatory Visit: Payer: Medicare Other | Attending: Pulmonary Disease | Admitting: *Deleted

## 2016-01-18 DIAGNOSIS — R1314 Dysphagia, pharyngoesophageal phase: Secondary | ICD-10-CM | POA: Diagnosis not present

## 2016-01-18 NOTE — Therapy (Signed)
[facility] [address] [place], [place], [address] Phone: [phone]   Fax:  [phone]  Speech Language Pathology Evaluation  Patient Details  Name: Brandy Russell MRN: [id number] Date of Birth: 09-16-1955 No Data Recorded  Encounter Date: 16-Feb-2016      End of Session - Feb 16, 2016 1627    Visit Number 1   Number of Visits 1   SLP Start Time 1520   SLP Stop Time  [id number]   SLP Time Calculation (min) 44 min   Activity Tolerance Patient tolerated treatment well      Past Medical History:  Diagnosis Date  . Anxiety and depression   . CAD (coronary artery disease)    incidental finding on CT scan for lung ca screening  . Constipation   . Emphysema   . Herpes    face, uses suppresive therapy intermittently  . Seasonal allergies     Past Surgical History:  Procedure Laterality Date  . DILATION AND CURETTAGE OF UTERUS    . TONSILLECTOMY AND ADENOIDECTOMY  1962    There were no vitals filed for this visit.      Subjective Assessment - 2016/02/16 1605    Subjective "I swallow air."   Currently in Pain? No/denies            SLP Evaluation [facility] - 02/16/2016 1605      SLP Visit Information   SLP Received On 2016/02/16     General Information   HPI Pt reports a 3 year history of "swallowing air" particularly when exercising which results in bloating. Pt reports feeling unable to eat when bloated secondary to lack of appetite. Pt is able to continue taking liquids when she is bloated. She reports that if she is able to belch, sometimes she has relief, but often she is unable to belch and therefore may not eat for several days.    Behavioral/Cognition Pleasant although she appeared frustrated by lack of answers and duration of struggle with this limitation.   Mobility Status Independent     Prior Functional Status   Cognitive/Linguistic Baseline Within functional limits                                Plan - 16-Feb-2016 1628    Clinical Impression Statement Pt manges all PO WFL with the exception of occasional throat clear following boluses of thin. However, given pt's COPD, this behavior is typical among this population and does not appear to be problematic at this time. Primary barrier to PO intake at this time is "swallowing air" with exercise per pt report with subsequent bloating. No further SLP intervention warranted. Consider GI consult.   Consulted and Agree with Plan of Care Patient      Patient will benefit from skilled therapeutic intervention in order to improve the following deficits and impairments:   Dysphagia, pharyngoesophageal phase      G-Codes - February 16, 2016 1631    Functional Limitations Swallowing   Swallow Current Status [id number]) 0 percent impaired, limited or restricted   Swallow Goal Status [id number]) 0 percent impaired, limited or restricted   Swallow Discharge Status [id number]) 0 percent impaired, limited or restricted      Problem List Patient Active Problem List   Diagnosis Date Noted  . Coronary artery disease involving native coronary artery of native heart without angina pectoris 11/11/2014  . Depression and Anxiety 11/11/2014  . CN (constipation) 11/11/2014  .  Herpes 11/11/2014  . Ejection fraction < 50% 05/30/2012  . COPD with emphysema ([place]) 04/16/2012  . Depression 04/16/2012    Vinetta Bergamo MA, CCC-SLP 01/18/2016, 4:33 PM  [facility] [address] [place], [place], [address] Phone: [phone]   Fax:  [phone]  Name: Brandy Russell MRN: [id number] Date of Birth: 12-24-1955

## 2016-03-16 ENCOUNTER — Telehealth: Payer: Self-pay | Admitting: Pulmonary Disease

## 2016-03-16 MED ORDER — NYSTATIN 100000 UNIT/ML MT SUSP: 5.0000 mL | Freq: Two times a day (BID) | OROMUCOSAL | 0 refills | Status: DC

## 2016-03-16 MED ORDER — AZITHROMYCIN 250 MG PO TABS: 250.0000 mg | ORAL_TABLET | Freq: Every day | ORAL | 0 refills | Status: DC

## 2016-03-16 NOTE — Telephone Encounter (Signed)
zpak Nystatin 10,000 units 5 ML twice daily for 7 days

## 2016-03-16 NOTE — Telephone Encounter (Signed)
lmtcb x1 for pt. 

## 2016-03-16 NOTE — Telephone Encounter (Signed)
Spoke with patient. Informed her of the meds. She verbalized understanding and was very happy! No further questions.

## 2016-03-16 NOTE — Telephone Encounter (Signed)
Spoke with pt who states she was seen at urgent care on 03/11/16 and was giving prednisone for a pulled muscle. Since started prednisone pt has developed thrush. Pt has been using duke's magic mouth wash that was previously prescribed with little relief. Pt also c/o prod cough with dark yellow mucus & chest discomfort X 2d Pt denies any fever, chills or sweats. Pt would like recommendations.  RA please advise in VS absence.

## 2016-03-16 NOTE — Telephone Encounter (Signed)
Patient returned phone call.Brandy Russell ° °

## 2016-03-18 ENCOUNTER — Other Ambulatory Visit: Payer: Self-pay | Admitting: Pulmonary Disease

## 2016-03-18 NOTE — Telephone Encounter (Signed)
Patient requesting refill on alprazolam 0.25mg  tab Last refill: 12/07/15 #90 1 tab tid prn anxiety 0 refills Last ov: 11/17/15 Next ov: 05/17/16  VS ok to refill?  Thanks!

## 2016-04-07 ENCOUNTER — Telehealth: Payer: Self-pay | Admitting: Pulmonary Disease

## 2016-04-07 ENCOUNTER — Other Ambulatory Visit: Payer: Self-pay | Admitting: Pulmonary Disease

## 2016-04-07 MED ORDER — FLUTICASONE-SALMETEROL 250-50 MCG/DOSE IN AEPB: 1.0000 | INHALATION_SPRAY | Freq: Two times a day (BID) | RESPIRATORY_TRACT | 5 refills | Status: DC

## 2016-04-07 NOTE — Telephone Encounter (Signed)
Rx sent to preferred pharmacy. Pt aware and voiced her understanding. Nothing further needed.  

## 2016-05-17 ENCOUNTER — Encounter: Payer: Self-pay | Admitting: Pulmonary Disease

## 2016-05-17 ENCOUNTER — Ambulatory Visit (INDEPENDENT_AMBULATORY_CARE_PROVIDER_SITE_OTHER): Payer: Medicare Other | Admitting: Pulmonary Disease

## 2016-05-17 VITALS — BP 122/84 | HR 75 | Ht 66.0 in | Wt 120.0 lb

## 2016-05-17 DIAGNOSIS — J432 Centrilobular emphysema: Secondary | ICD-10-CM

## 2016-05-17 DIAGNOSIS — F458 Other somatoform disorders: Secondary | ICD-10-CM

## 2016-05-17 NOTE — Patient Instructions (Signed)
Follow up in 6 months 

## 2016-05-17 NOTE — Progress Notes (Signed)
Current Outpatient Prescriptions on File Prior to Visit  Medication Sig  . acyclovir (ZOVIRAX) 400 MG tablet Take 1 tablet (400 mg total) by mouth 5 (five) times daily. (Patient taking differently: Take 400 mg by mouth as needed. )  . ALPRAZolam (XANAX) 0.25 MG tablet TAKE 1 TABLET BY MOUTH THREE TIMES DAILY AS NEEDED FOR ANXIETY  . buPROPion (WELLBUTRIN SR) 150 MG 12 hr tablet TAKE 1 TABLET BY MOUTH TWICE DAILY  . Fluticasone-Salmeterol (ADVAIR DISKUS) 250-50 MCG/DOSE AEPB Inhale 1 puff into the lungs 2 (two) times daily.  Marland Kitchen ibuprofen (ADVIL,MOTRIN) 200 MG tablet Take 200 mg by mouth every 6 (six) hours as needed.  Marland Kitchen omeprazole (PRILOSEC) 20 MG capsule Take 20 mg by mouth 2 (two) times daily before a meal.   . PROAIR HFA 108 (90 Base) MCG/ACT inhaler INHALE 2 PUFFS INTO THE LUNGS EVERY 6 HOURS AS NEEDED FOR WHEEZING OR SHORTNESS OF BREATH   No current facility-administered medications on file prior to visit.     Chief Complaint  Patient presents with  . Follow-up    Pt states that her breathing has been okay since last OV. Pt states that it varies from day to day.     Pulmonary tests CT chest 04/04/09 >> apical centrilobular emphysema, 3 mm RUL nodule, 4 mm RUL nodule Spirometry 05/30/12 >> FEV1 0.69 (26%), FEV1% 40 A1AT 05/30/12 >> 112, PI*MS ONO with RA 07/03/12 >> Test time 8 hrs 33 min.  Mean SpO2 92.6%, low SpO2 87%.  Spent 8 min with SpO2 < 88%. Low dose CT chest 10/03/14 >> multiple pulmonary nodules up to 5 mm, mild centrilobular and paraseptal emphysema Sputum 07/23/15 >> Pseudomonas aeruginosa Low dose CT chest 10/06/15 >> atherosclerosis, mod centrilobular emphysema, bronchial wall thickening, scattered nodules up to 3.6 mm Esophagram 11/25/15 >> no specific abnormality  Cardiac tests Echo 02/08/05 >> EF 40 to 50%  Past medical history Depression, GERD  Past surgical history, allergies, social history, and family history reviewed  Vital signs BP 122/84 (BP Location: Left  Arm, Cuff Size: Normal)   Pulse 75   Ht 5\' 6"  (1.676 m)   Wt 120 lb (54.4 kg)   SpO2 98%   BMI 19.37 kg/m   History of Present Illness: Brandy Russell is a 61 y.o. 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  Since her last visit she was seen by speech therapy.  She didn't have issue with swallowing, but had review of her diet.  She has been eating yogurt and drinking ginger ale.  This has helped her bloating, and she is starting to gain some weight.  Her breathing has been okay for the most part.  She is able to walk around [employer] with her dog.  She notices more trouble with weather changes.  She gets occasional cough and clear sputum.  Not getting wheeze.  Denies chest pain, fever, sinus congestion, or leg swelling.  Not using albuterol much.  Physical Exam:  General - pleasant, thin Eyes - wears glasses ENT - no sinus tenderness, no oral exudate, no LAN Cardiac - regular, no murmur Chest - no wheeze, rales Back - no tenderness Abd - soft, non tender Ext - no edema Neuro - normal strength Skin - no rashes Psych - normal mood   Impression:  COPD with emphysema. - continue advair and prn albuterol  Dynamic hyperinflation. - prn xanax  Lung cancer screening. - she has f/u CT scan in July 2018 scheduled  GERD. Aerophagia, dysphagia. - improved  since last visit with dietary changes - PPI per PCP   Patient Instructions  Follow up in 6 months   Chesley Mires, MD  Pulmonary/Critical Care/Sleep Pager:  [phone] 05/17/2016, 9:57 AM

## 2016-06-20 ENCOUNTER — Other Ambulatory Visit: Payer: Self-pay | Admitting: Pulmonary Disease

## 2016-07-26 ENCOUNTER — Other Ambulatory Visit: Payer: Self-pay | Admitting: Pulmonary Disease

## 2016-07-27 NOTE — Telephone Encounter (Signed)
rx printed and signed by VS, faxed to preferred pharmacy by Ashtyn.  Nothing further needed.

## 2016-07-27 NOTE — Telephone Encounter (Signed)
Dr. Halford Chessman, Ms. Olivar is requesting a refill on her xanax 0.25mg . She received her last refill on 03/23/16 for 90 tabs. Her last OV was on 05/17/16.    Is it ok for her to receive this refill? If so, she wishes to use [facility] on [address]. Thanks!

## 2016-09-09 ENCOUNTER — Other Ambulatory Visit: Payer: Self-pay | Admitting: Acute Care

## 2016-09-09 DIAGNOSIS — Z87891 Personal history of nicotine dependence: Secondary | ICD-10-CM

## 2016-10-06 ENCOUNTER — Ambulatory Visit (INDEPENDENT_AMBULATORY_CARE_PROVIDER_SITE_OTHER)
Admission: RE | Admit: 2016-10-06 | Discharge: 2016-10-06 | Disposition: A | Discharge status: Home / Self Care | Payer: Medicare Other | Source: Ambulatory Visit | Attending: Acute Care | Admitting: Acute Care

## 2016-10-06 DIAGNOSIS — Z87891 Personal history of nicotine dependence: Secondary | ICD-10-CM | POA: Diagnosis not present

## 2016-10-07 ENCOUNTER — Telehealth: Payer: Self-pay | Admitting: Acute Care

## 2016-10-10 NOTE — Telephone Encounter (Signed)
SG please advise- pt is wishing to speak with you regarding screening CT.  Thanks!

## 2016-10-10 NOTE — Telephone Encounter (Signed)
I have spoken with the patient about her results. She verbalized understanding. She had no further questions at completion of the call.

## 2016-10-10 NOTE — Telephone Encounter (Signed)
The results of the patient's CT scan are documented on the scan as a result note.. Please call the patient and give her the results documented on the scan. Thanks so much

## 2016-10-11 ENCOUNTER — Other Ambulatory Visit: Payer: Self-pay | Admitting: Acute Care

## 2016-10-11 DIAGNOSIS — Z87891 Personal history of nicotine dependence: Secondary | ICD-10-CM

## 2016-10-13 IMAGING — CT CT CHEST LUNG CANCER SCREENING LOW DOSE W/O CM
2 of 5 series · 12 of 40 positions shown, 15 images · non-contrast
Comparison: Chest CT 04/04/2009.

CLINICAL DATA: 59-year-old female former smoker (quit 8 years ago)
with 70 pack-year history of smoking. Lung cancer screening
examination.

EXAM:
CT CHEST WITHOUT CONTRAST
TECHNIQUE: Multidetector CT imaging of the chest was performed following the
standard protocol without IV contrast.

[Series 2: chest routine with · axial · 0.62mm/px · z∈[+768,+1043]mm · 9 of 69 slices shown, 12 images]
[im 7/69  mediastinal]
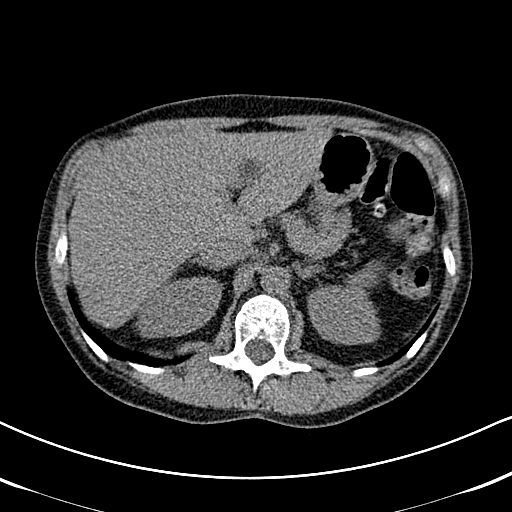
[im 7/69  lung]
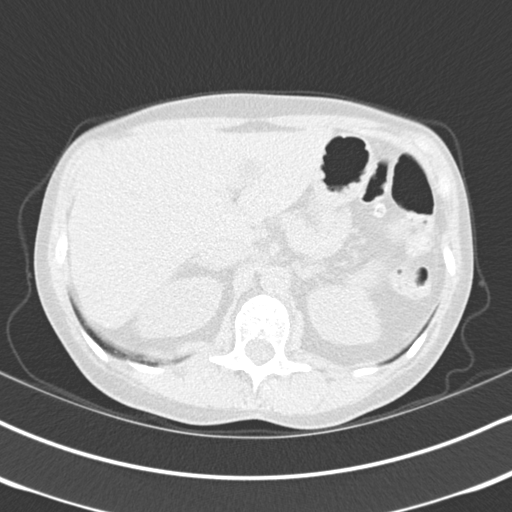
[im 14/69  lung]
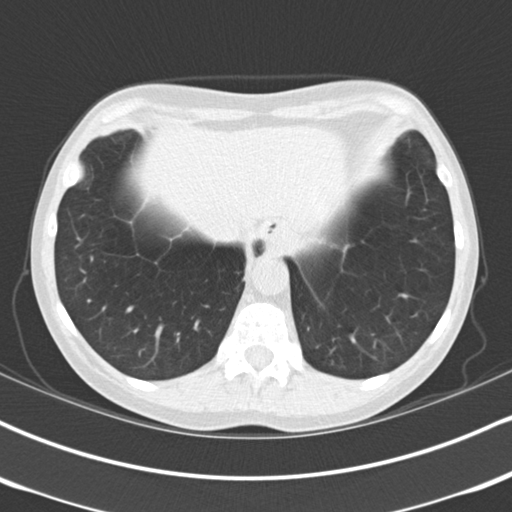
[im 21/69  lung]
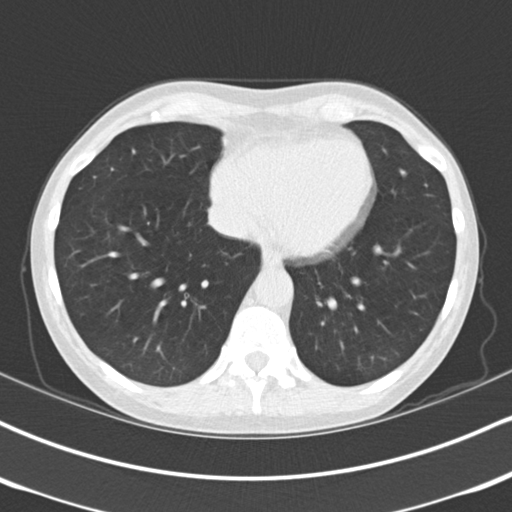
[im 28/69  lung]
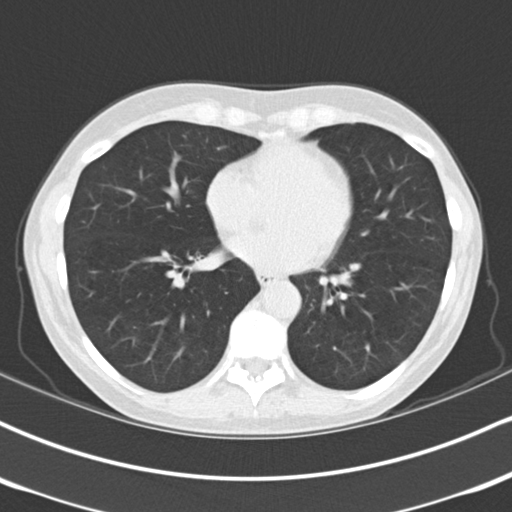
[im 35/69  mediastinal]
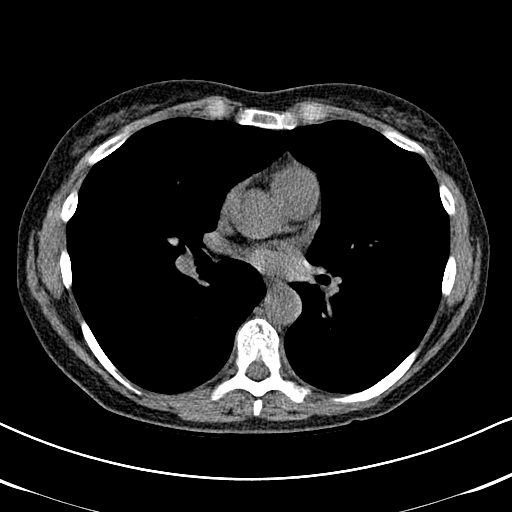
[im 35/69  lung]
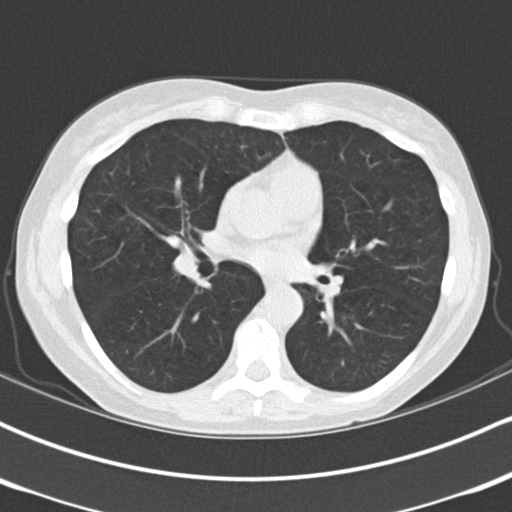
[im 41/69  lung]
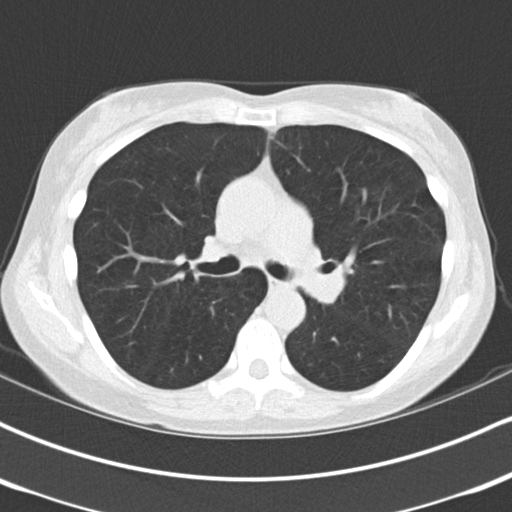
[im 48/69  lung]
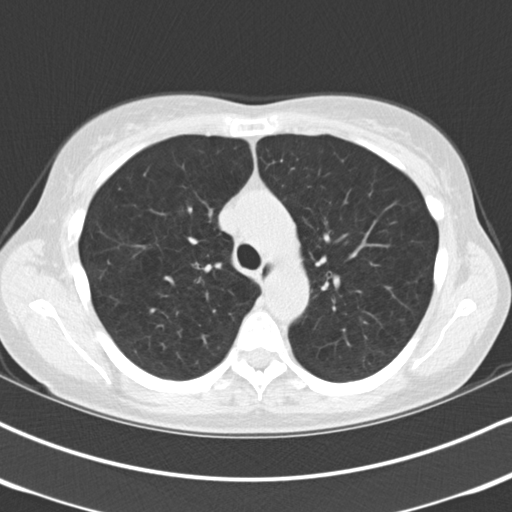
[im 55/69  lung]
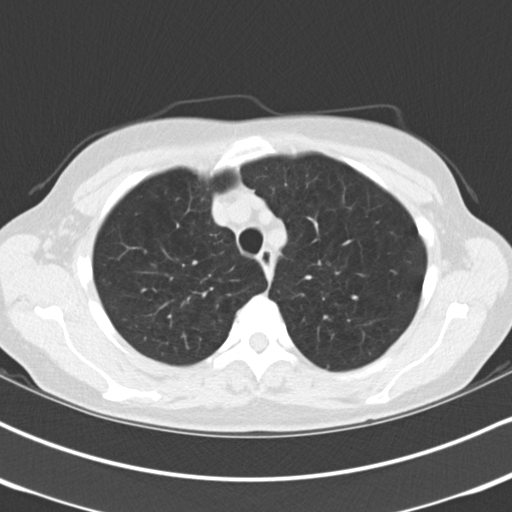
[im 62/69  mediastinal]
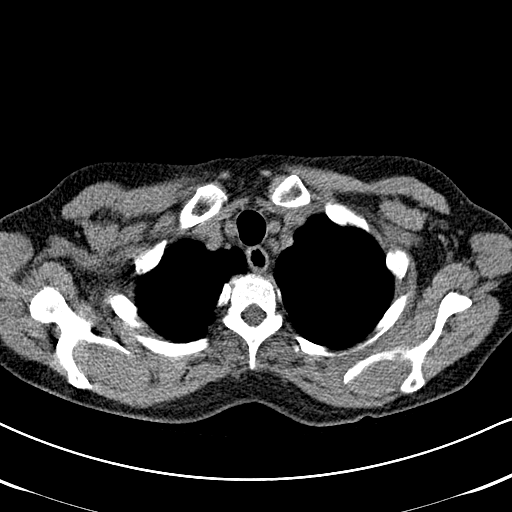
[im 62/69  lung]
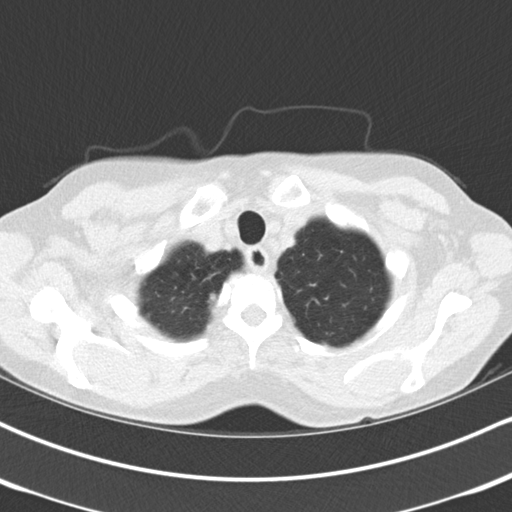

[Series 602: cor · coronal · 0.67mm/px · 3 of 105 slices shown]
[im 21/105  lung]
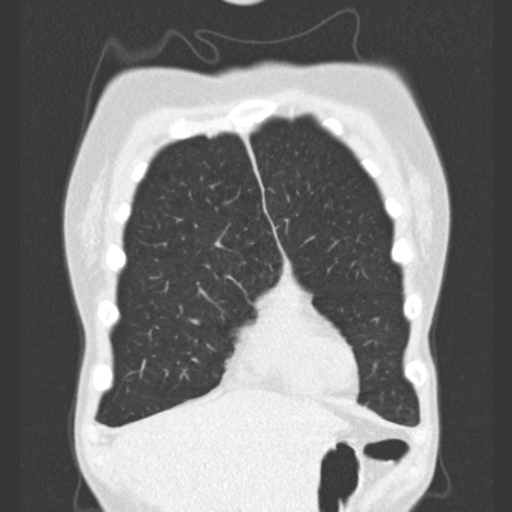
[im 42/105  lung]
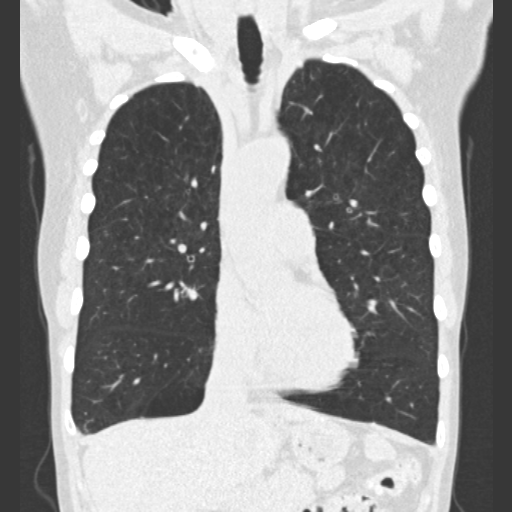
[im 63/105  lung]
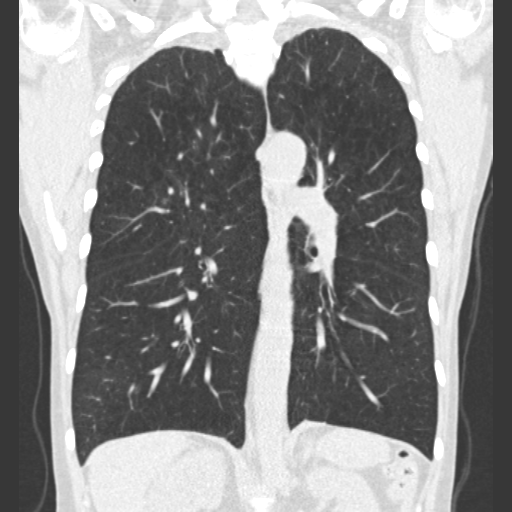

[12 of 40 positions shown; findings below may reference images not displayed]

FINDINGS: Mediastinum/Lymph Nodes: Heart size is normal. There is no
significant pericardial fluid, thickening or pericardial
calcification. There is atherosclerosis of the thoracic aorta, the
great vessels of the mediastinum and the coronary arteries,
including calcified atherosclerotic plaque in the left anterior
descending and right coronary arteries. No pathologically enlarged
mediastinal or hilar lymph nodes. Please note that accurate
exclusion of hilar adenopathy is limited on noncontrast CT scans.
Esophagus is unremarkable in appearance. No axillary
lymphadenopathy.

Lungs/Pleura: Multiple pulmonary nodules are noted in the lungs
bilaterally, the largest of which is in the left upper lobe (image
49 of series 4), which demonstrates a volume derived mean diameter
4.6 mm. No larger more suspicious appearing pulmonary nodules or
masses are otherwise noted. No acute consolidative airspace disease.
No pleural effusions. Mild diffuse bronchial wall thickening with
mild centrilobular and paraseptal emphysema.

Upper Abdomen: 5 mm nonobstructive calculus in the upper pole
collecting system of the left kidney. 2.3 cm low-attenuation lesion
in segment 2 of the liver is incompletely characterized on today's
noncontrast CT examination, but is similar to prior studies, likely
a cyst.

Musculoskeletal/Soft Tissues: There are no aggressive appearing
lytic or blastic lesions noted in the visualized portions of the
skeleton.
IMPRESSION: 1. Lung-RADS Category 2S, benign appearance or behavior. Continue
annual screening with low-dose chest CT without contrast in 12
months.
2. The "S" modifier above refers to potentially clinically
significant non lung cancer related findings. Specifically, there is
atherosclerosis, including 2 vessel coronary artery disease. Please
note that although the presence of coronary artery calcium documents
the presence of coronary artery disease, the severity of this
disease and any potential stenosis cannot be assessed on this
non-gated CT examination. Assessment for potential risk factor
modification, dietary therapy or pharmacologic therapy may be
warranted, if clinically indicated.
3. 5 mm nonobstructive calculus in the upper pole collecting system
of the left kidney.
4. Mild diffuse bronchial thickening with mild centrilobular and
paraseptal emphysema; imaging findings suggestive of underlying
COPD.

## 2016-10-14 ENCOUNTER — Telehealth: Payer: Self-pay | Admitting: Pulmonary Disease

## 2016-10-14 MED ORDER — AZITHROMYCIN 250 MG PO TABS: ORAL_TABLET | ORAL | 0 refills | Status: DC

## 2016-10-14 MED ORDER — PREDNISONE 10 MG PO TABS: ORAL_TABLET | ORAL | 0 refills | Status: DC

## 2016-10-14 NOTE — Telephone Encounter (Signed)
Spoke with patient. She stated that she has had increased wheezing and a fever since yesterday. She has a productive cough with green-yellow phlegm. She also has body aches. She last took Advil last night for the fever.   Pt wishes to use [facility] on [address].   VS, please advise. Thanks!

## 2016-10-14 NOTE — Telephone Encounter (Signed)
Please send zpak.  Please send prednisone 10 mg pills >> 3 pills daily for 2 days, 2 pills daily for 2 day, 1 pill daily for 2 days.  If she doesn't improve over the weekend, then she will need to come for an acute visit next week.

## 2016-10-14 NOTE — Telephone Encounter (Signed)
Called and spoke with pt and she is aware of VS recs. Nothing further is needed.  

## 2016-10-14 NOTE — Telephone Encounter (Signed)
I attempted to call the pt but the phone call will not go through.  Jess stated that she will call the pt.

## 2016-10-21 ENCOUNTER — Telehealth: Payer: Self-pay | Admitting: Pulmonary Disease

## 2016-10-21 ENCOUNTER — Other Ambulatory Visit: Payer: Self-pay | Admitting: Pulmonary Disease

## 2016-10-21 NOTE — Telephone Encounter (Signed)
Spoke with patient-aware that Rx has been sent to pharmacy-was sent under CY but contact pharmacy and updated the MD name so VS name would appear on RX.

## 2016-11-14 ENCOUNTER — Encounter: Payer: Self-pay | Admitting: Pulmonary Disease

## 2016-11-14 ENCOUNTER — Ambulatory Visit (INDEPENDENT_AMBULATORY_CARE_PROVIDER_SITE_OTHER): Payer: Medicare Other | Admitting: Pulmonary Disease

## 2016-11-14 VITALS — BP 120/80 | HR 81 | Ht 67.0 in | Wt 113.2 lb

## 2016-11-14 DIAGNOSIS — J432 Centrilobular emphysema: Secondary | ICD-10-CM | POA: Diagnosis not present

## 2016-11-14 MED ORDER — ALPRAZOLAM 0.25 MG PO TABS: 0.2500 mg | ORAL_TABLET | Freq: Three times a day (TID) | ORAL | 1 refills | Status: DC | PRN

## 2016-11-14 MED ORDER — ALBUTEROL SULFATE HFA 108 (90 BASE) MCG/ACT IN AERS: 2.0000 | INHALATION_SPRAY | Freq: Four times a day (QID) | RESPIRATORY_TRACT | 3 refills | Status: DC | PRN

## 2016-11-14 NOTE — Patient Instructions (Signed)
Follow up in 6 months 

## 2016-11-14 NOTE — Progress Notes (Signed)
Current Outpatient Prescriptions on File Prior to Visit  Medication Sig  . acyclovir (ZOVIRAX) 400 MG tablet Take 1 tablet (400 mg total) by mouth 5 (five) times daily. (Patient taking differently: Take 400 mg by mouth as needed. )  . ADVAIR DISKUS 250-50 MCG/DOSE AEPB inhale 1 dose by mouth twice a day  . buPROPion (WELLBUTRIN SR) 150 MG 12 hr tablet TAKE 1 TABLET BY MOUTH TWICE DAILY  . ibuprofen (ADVIL,MOTRIN) 200 MG tablet Take 200 mg by mouth every 6 (six) hours as needed.  Marland Kitchen omeprazole (PRILOSEC) 20 MG capsule Take 20 mg by mouth 2 (two) times daily before a meal.    No current facility-administered medications on file prior to visit.     Chief Complaint  Patient presents with  . Follow-up    Pt notes little cough. Not much change in breathing since last OV. No new complaints.     Pulmonary tests CT chest 04/04/09 >> apical centrilobular emphysema, 3 mm RUL nodule, 4 mm RUL nodule Spirometry 05/30/12 >> FEV1 0.69 (26%), FEV1% 40 A1AT 05/30/12 >> 112, PI*MS ONO with RA 07/03/12 >> Test time 8 hrs 33 min.  Mean SpO2 92.6%, low SpO2 87%.  Spent 8 min with SpO2 < 88%. Low dose CT chest 10/03/14 >> multiple pulmonary nodules up to 5 mm, mild centrilobular and paraseptal emphysema Sputum 07/23/15 >> Pseudomonas aeruginosa Low dose CT chest 10/06/15 >> atherosclerosis, mod centrilobular emphysema, bronchial wall thickening, scattered nodules up to 3.6 mm Esophagram 11/25/15 >> no specific abnormality  Cardiac tests Echo 02/08/05 >> EF 40 to 50%  Past medical history Depression, GERD  Past surgical history, allergies, social history, and family history reviewed  Vital signs BP 120/80 (BP Location: Left Arm, Cuff Size: Normal)   Pulse 81   Ht 5\' 7"  (1.702 m)   Wt 113 lb 3.2 oz (51.3 kg)   SpO2 100%   BMI 17.73 kg/m   History of Present Illness: Brandy RIEMENSCHNEIDER is a 61 y.o. 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  She had CT scan in July for lung cancer screening.  Lung RAD  2.  She needed prednisone and zithromax in July.  Doing better since.  Not having cough, wheeze, or sputum.  Still has episodes of bloating.  Walked two miles this morning w/o difficulty.  Physical Exam:  General - pleasant Eyes - pupils reactive ENT - no sinus tenderness, no oral exudate, no LAN Cardiac - regular, no murmur Chest - no wheeze, rales Abd - soft, non tender Ext - no edema Skin - no rashes Neuro - normal strength Psych - normal mood  Impression:  COPD with emphysema. - continue advair and prn albuterol  Dynamic hyperinflation. - refilled xanax  Lung cancer screening. - f/u CT chest in July 2019   Patient Instructions  Follow up in 6 months   Chesley Mires, MD [place] [facility]monary/Critical Care/Sleep Pager:  [phone] 11/14/2016, 2:13 PM

## 2016-11-21 ENCOUNTER — Other Ambulatory Visit: Payer: Self-pay | Admitting: Internal Medicine

## 2016-11-24 ENCOUNTER — Telehealth: Payer: Self-pay | Admitting: Pulmonary Disease

## 2016-11-24 MED ORDER — FLUTICASONE-SALMETEROL 250-50 MCG/DOSE IN AEPB: INHALATION_SPRAY | RESPIRATORY_TRACT | 11 refills | Status: DC

## 2016-11-24 NOTE — Telephone Encounter (Signed)
Pt calling back to see if her Advir had been sent to pharmacy.  She only has transportation to pick it up for 40 more minutes.  -tr

## 2016-11-24 NOTE — Telephone Encounter (Signed)
Spoke with pt, requesting refill on advair. This has been sent to preferred pharmacy.  Nothing further needed.

## 2016-12-16 ENCOUNTER — Other Ambulatory Visit: Payer: Self-pay | Admitting: Internal Medicine

## 2016-12-16 DIAGNOSIS — Z1231 Encounter for screening mammogram for malignant neoplasm of breast: Secondary | ICD-10-CM

## 2016-12-27 ENCOUNTER — Ambulatory Visit
Admission: RE | Admit: 2016-12-27 | Discharge: 2016-12-27 | Disposition: A | Discharge status: Home / Self Care | Payer: Medicare Other | Source: Ambulatory Visit | Attending: Internal Medicine | Admitting: Internal Medicine

## 2016-12-27 DIAGNOSIS — Z1231 Encounter for screening mammogram for malignant neoplasm of breast: Secondary | ICD-10-CM

## 2016-12-29 ENCOUNTER — Other Ambulatory Visit: Payer: Self-pay | Admitting: Internal Medicine

## 2016-12-29 DIAGNOSIS — R928 Other abnormal and inconclusive findings on diagnostic imaging of breast: Secondary | ICD-10-CM

## 2017-01-03 ENCOUNTER — Ambulatory Visit: Payer: Self-pay

## 2017-01-03 ENCOUNTER — Ambulatory Visit
Admission: RE | Admit: 2017-01-03 | Discharge: 2017-01-03 | Disposition: A | Discharge status: Home / Self Care | Payer: Medicare Other | Source: Ambulatory Visit | Attending: Internal Medicine | Admitting: Internal Medicine

## 2017-01-03 DIAGNOSIS — R928 Other abnormal and inconclusive findings on diagnostic imaging of breast: Secondary | ICD-10-CM

## 2017-02-04 ENCOUNTER — Telehealth: Payer: Self-pay | Admitting: Internal Medicine

## 2017-02-04 NOTE — Telephone Encounter (Signed)
Weekend call into weekend pager  Has emphysema Feeling poorly - tired  X 1 week, today 02/04/2017 - dark sputum but yellow prior for few days. Wants abx and pred. Increased wheezing and mild increase sod  O Speaking comfortablly No distress  Allergies  Allergen Reactions  . Penicillins   . Sulfa Antibiotics     GI upset per the pt    A AECOPD  P [facility] [address] - left voice mail  Take doxycycline 100mg  po twice daily x 5 days; take after meals and avoid sunlight  Please take prednisone 40 mg x1 day, then 30 mg x1 day, then 20 mg x1 day, then 10 mg x1 day, and then 5 mg x1 day and stop    Dr. Brand Males, M.D., [facility].C.P Pulmonary and Critical Care Medicine Staff Physician [place] [facility] Pager: [phone], If no answer or between  15:00h - 7:00h: call [phone]  02/04/2017 12:44 PM

## 2017-05-01 ENCOUNTER — Telehealth: Payer: Self-pay | Admitting: Pulmonary Disease

## 2017-05-01 MED ORDER — DOXYCYCLINE HYCLATE 100 MG PO TABS: 100.0000 mg | ORAL_TABLET | Freq: Two times a day (BID) | ORAL | 0 refills | Status: DC

## 2017-05-01 NOTE — Telephone Encounter (Signed)
Called and spoke with pt.  Pt reports of increased sob, prod cough with yellow mucus, body aches & increase fatigue.  Not sure if she has a fever, as she has not measured it.  Pt had scheduled apt tomorrow with TP, but is concerned about being around other patients with current symptoms.  CY please advise. Thanks.   Current Outpatient Medications on File Prior to Visit  Medication Sig Dispense Refill  . acyclovir (ZOVIRAX) 400 MG tablet Take 1 tablet (400 mg total) by mouth 5 (five) times daily. (Patient taking differently: Take 400 mg by mouth as needed. ) 60 tablet 3  . albuterol (PROAIR HFA) 108 (90 Base) MCG/ACT inhaler Inhale 2 puffs into the lungs every 6 (six) hours as needed for wheezing or shortness of breath. 3 Inhaler 3  . ALPRAZolam (XANAX) 0.25 MG tablet Take 1 tablet (0.25 mg total) by mouth 3 (three) times daily as needed. 90 tablet 1  . buPROPion (WELLBUTRIN SR) 150 MG 12 hr tablet TAKE 1 TABLET BY MOUTH TWICE DAILY 180 tablet 4  . Fluticasone-Salmeterol (ADVAIR DISKUS) 250-50 MCG/DOSE AEPB inhale 1 dose by mouth twice a day 60 each 11  . ibuprofen (ADVIL,MOTRIN) 200 MG tablet Take 200 mg by mouth every 6 (six) hours as needed.    Marland Kitchen omeprazole (PRILOSEC) 20 MG capsule Take 20 mg by mouth 2 (two) times daily before a meal.      No current facility-administered medications on file prior to visit.     Allergies  Allergen Reactions  . Penicillins   . Sulfa Antibiotics     GI upset per the pt

## 2017-05-01 NOTE — Telephone Encounter (Signed)
Called and spoke with patient, she is aware of advisement and will be in tomorrow to see TP. Patient states that she will bring her own mask. Medication sent to the pharmacy.

## 2017-05-01 NOTE — Telephone Encounter (Signed)
Offer doxycycline 100 mg, # 14, 1 twice daily  If she doesn't have a mask to wear when she, comes, she can ask for one at the front desk.

## 2017-05-02 ENCOUNTER — Ambulatory Visit (INDEPENDENT_AMBULATORY_CARE_PROVIDER_SITE_OTHER)
Admission: RE | Admit: 2017-05-02 | Discharge: 2017-05-02 | Disposition: A | Discharge status: Home / Self Care | Payer: Medicare Other | Source: Ambulatory Visit | Attending: Adult Health | Admitting: Adult Health

## 2017-05-02 ENCOUNTER — Ambulatory Visit: Payer: Medicare Other | Admitting: Adult Health

## 2017-05-02 ENCOUNTER — Encounter: Payer: Self-pay | Admitting: Adult Health

## 2017-05-02 ENCOUNTER — Ambulatory Visit: Payer: Self-pay | Admitting: Pulmonary Disease

## 2017-05-02 VITALS — BP 124/74 | HR 92 | Temp 99.2°F | Ht 67.0 in | Wt 113.4 lb

## 2017-05-02 DIAGNOSIS — J441 Chronic obstructive pulmonary disease with (acute) exacerbation: Secondary | ICD-10-CM | POA: Diagnosis not present

## 2017-05-02 DIAGNOSIS — J439 Emphysema, unspecified: Secondary | ICD-10-CM

## 2017-05-02 MED ORDER — PREDNISONE 10 MG PO TABS: ORAL_TABLET | ORAL | 0 refills | Status: DC

## 2017-05-02 MED ORDER — ALPRAZOLAM 0.25 MG PO TABS: 0.2500 mg | ORAL_TABLET | Freq: Three times a day (TID) | ORAL | 0 refills | Status: DC | PRN

## 2017-05-02 MED ORDER — LEVALBUTEROL HCL 0.63 MG/3ML IN NEBU
0.6300 mg | INHALATION_SOLUTION | Freq: Once | RESPIRATORY_TRACT | Status: AC
  Administered 2017-05-02: 0.63 mg via RESPIRATORY_TRACT

## 2017-05-02 NOTE — Assessment & Plan Note (Addendum)
Acute exacerbation of COPD. Check chest x-ray Xopenex nebulizer given in office  Plan  Patient Instructions  Finish doxycycline as directed Mucinex DM twice daily as needed for cough and congestion Prednisone taper the next week Chest x-ray today Follow-up in 4-6 weeks with Dr. Halford Chessman and as needed Please contact office for sooner follow up if symptoms do not improve or worsen or seek emergency care

## 2017-05-02 NOTE — Patient Instructions (Signed)
Finish doxycycline as directed Mucinex DM twice daily as needed for cough and congestion Prednisone taper the next week Chest x-ray today Follow-up in 4-6 weeks with Dr. Halford Chessman and as needed Please contact office for sooner follow up if symptoms do not improve or worsen or seek emergency care

## 2017-05-02 NOTE — Progress Notes (Signed)
@Patient  ID: Brandy Russell, female    DOB: 1956-01-20, 62 y.o.   MRN: [id number]  Chief Complaint  Patient presents with  . Acute Visit    COPD     Referring provider: Haywood Pao, MD  HPI: 62 year old female former smoker followed for COPD with emphysema  Pulmonary tests CT chest 04/04/09 >> apical centrilobular emphysema, 3 mm RUL nodule, 4 mm RUL nodule Spirometry 05/30/12 >> FEV1 0.69 (26%), FEV1% 40 A1AT 05/30/12 >> 112, PI*MS ONO with RA 07/03/12 >> Test time 8 hrs 33 min.  Mean SpO2 92.6%, low SpO2 87%.  Spent 8 min with SpO2 < 88%. Low dose CT chest 10/03/14 >> multiple pulmonary nodules up to 5 mm, mild centrilobular and paraseptal emphysema Sputum 07/23/15 >> Pseudomonas aeruginosa Low dose CT chest 10/06/15 >> atherosclerosis, mod centrilobular emphysema, bronchial wall thickening, scattered nodules up to 3.6 mm Esophagram 11/25/15 >> no specific abnormality  Cardiac tests Echo 02/08/05 >> EF 40 to 50%  05/02/2017 acute office visit Patient complains over the last 2 weeks she has had increased cough congestion with yellow mucus wheezing and shortness of breath.  Patient says she feels weak with a decreased appetite.  She denies any hemoptysis chest pain orthopnea PND or leg swelling. She denies any nausea vomiting or diarrhea.  Patient was called in doxycycline for 1 wk .     Allergies  Allergen Reactions  . Penicillins   . Sulfa Antibiotics     GI upset per the pt    Immunization History  Administered Date(s) Administered  . Influenza Split 12/26/2016  . Influenza Whole 12/27/2011, 12/04/2012  . Influenza,inj,Quad PF,6+ Mos 11/27/2014, 12/27/2015  . Influenza-Unspecified 12/09/2013  . Pneumococcal Conjugate-13 11/17/2015  . Pneumococcal Polysaccharide-23 03/28/2005, 01/09/2013  . Zoster 09/26/2014    Past Medical History:  Diagnosis Date  . Anxiety and depression   . CAD (coronary artery disease)    incidental finding on CT scan for lung ca  screening  . Constipation   . Emphysema   . Herpes    face, uses suppresive therapy intermittently  . Seasonal allergies     Tobacco History: Social History   Tobacco Use  Smoking Status Former Smoker  . Packs/day: 2.00  . Years: 35.00  . Pack years: 70.00  . Types: Cigarettes  . Last attempt to quit: 03/28/2006  . Years since quitting: 11.1  Smokeless Tobacco Never Used   Counseling given: Not Answered   Outpatient Encounter Medications as of 05/02/2017  Medication Sig  . acyclovir (ZOVIRAX) 400 MG tablet Take 1 tablet (400 mg total) by mouth 5 (five) times daily. (Patient taking differently: Take 400 mg by mouth as needed. )  . albuterol (PROAIR HFA) 108 (90 Base) MCG/ACT inhaler Inhale 2 puffs into the lungs every 6 (six) hours as needed for wheezing or shortness of breath.  . ALPRAZolam (XANAX) 0.25 MG tablet Take 1 tablet (0.25 mg total) by mouth 3 (three) times daily as needed.  Marland Kitchen buPROPion (WELLBUTRIN SR) 150 MG 12 hr tablet TAKE 1 TABLET BY MOUTH TWICE DAILY  . doxycycline (VIBRA-TABS) 100 MG tablet Take 1 tablet (100 mg total) by mouth 2 (two) times daily.  . Fluticasone-Salmeterol (ADVAIR DISKUS) 250-50 MCG/DOSE AEPB inhale 1 dose by mouth twice a day  . ibuprofen (ADVIL,MOTRIN) 200 MG tablet Take 200 mg by mouth every 6 (six) hours as needed.  Marland Kitchen omeprazole (PRILOSEC) 20 MG capsule Take 20 mg by mouth 2 (two) times daily before a meal.   .  predniSONE (DELTASONE) 10 MG tablet 4 tabs for 2 days, then 3 tabs for 2 days, 2 tabs for 2 days, then 1 tab for 2 days, then stop   No facility-administered encounter medications on file as of 05/02/2017.      Review of Systems  Constitutional:   No  weight loss, night sweats,  Fevers, chills, + fatigue, or  lassitude.  HEENT:   No headaches,  Difficulty swallowing,  Tooth/dental problems, or  Sore throat,                No sneezing, itching, ear ache, nasal congestion, post nasal drip,   CV:  No chest pain,  Orthopnea, PND,  swelling in lower extremities, anasarca, dizziness, palpitations, syncope.   GI  No heartburn, indigestion, abdominal pain, nausea, vomiting, diarrhea, change in bowel habits, loss of appetite, bloody stools.   Resp:    No chest wall deformity  Skin: no rash or lesions.  GU: no dysuria, change in color of urine, no urgency or frequency.  No flank pain, no hematuria   MS:  No joint pain or swelling.  No decreased range of motion.  No back pain.    Physical Exam  BP 124/74 (BP Location: Right Arm, Cuff Size: Normal)   Pulse 92   Temp 99.2 F (37.3 C) (Oral)   Ht 5\' 7"  (1.702 m)   Wt 113 lb 6.4 oz (51.4 kg)   SpO2 95%   BMI 17.76 kg/m   GEN: A/Ox3; pleasant , NAD, thin and frail   HEENT:  [place]/AT,  EACs-clear, TMs-wnl, NOSE-clear, THROAT-clear, no lesions, no postnasal drip or exudate noted.   NECK:  Supple w/ fair ROM; no JVD; normal carotid impulses w/o bruits; no thyromegaly or nodules palpated; no lymphadenopathy.    RESP few scattered rhonchi i. no accessory muscle use, no dullness to percussion  CARD:  RRR, no m/r/g, no peripheral edema, pulses intact, no cyanosis or clubbing.  GI:   Soft & nt; nml bowel sounds; no organomegaly or masses detected.   Musco: Warm bil, no deformities or joint swelling noted.   Neuro: alert, no focal deficits noted.    Skin: Warm, no lesions or rashes    Lab Results:  CBC  ProBNP No results found for: PROBNP  Imaging: No results found.   Assessment & Plan:   COPD with emphysema Acute exacerbation of COPD. Check chest x-ray Xopenex nebulizer given in office  Plan  There are no Patient Instructions on file for this visit.       [name], NP 05/02/2017

## 2017-05-02 NOTE — Addendum Note (Signed)
Addended by: Elton Sin on: 05/02/2017 04:49 PM   Modules accepted: Orders

## 2017-05-02 NOTE — Addendum Note (Signed)
Addended by: Elton Sin on: 05/02/2017 04:31 PM   Modules accepted: Orders

## 2017-05-02 NOTE — Addendum Note (Signed)
Addended by: Elton Sin on: 05/02/2017 04:25 PM   Modules accepted: Orders

## 2017-05-03 NOTE — Progress Notes (Signed)
Reviewed and agree with assessment/plan.   Aspasia Rude, MD [place] [facility]/Critical Care 03/23/2016, 12:24 PM Pager:  [phone]  

## 2017-05-22 ENCOUNTER — Telehealth: Payer: Self-pay | Admitting: Pulmonary Disease

## 2017-05-22 MED ORDER — AZITHROMYCIN 250 MG PO TABS: 250.0000 mg | ORAL_TABLET | Freq: Every day | ORAL | 0 refills | Status: DC

## 2017-05-22 NOTE — Telephone Encounter (Signed)
Spoke with patient. She stated that she developed a fever on Saturday with a temp of 100F. She also has a non-productive cough and her chest feels tight. She has no appetite. She has been taking Advil around the clock for her fever. Increased fatigue and SOB.   She stated that she was prescribed Doxy 100mg  on 2/4 for the same symptoms. She felt better for a little while but now the symptoms have come back.   She wishes to use [facility] on [address].   VS, please advise! Thanks!

## 2017-05-22 NOTE — Telephone Encounter (Signed)
Can send script for a Zpak.  If she isn't feeling better after this, then she would need a ROV.

## 2017-05-22 NOTE — Telephone Encounter (Signed)
Called pt and made her aware we were sending a script of zpak for her to her pharmacy.  Stated to pt if she was no better after this to call our office and we would schedule her for a ROV.  Pt expressed understanding. Nothing further needed at this current time.

## 2017-06-07 ENCOUNTER — Ambulatory Visit: Payer: Medicare Other | Admitting: Pulmonary Disease

## 2017-06-07 ENCOUNTER — Telehealth: Payer: Self-pay | Admitting: Pulmonary Disease

## 2017-06-07 ENCOUNTER — Encounter: Payer: Self-pay | Admitting: Pulmonary Disease

## 2017-06-07 VITALS — BP 134/70 | HR 88 | Ht 67.0 in | Wt 117.0 lb

## 2017-06-07 DIAGNOSIS — J432 Centrilobular emphysema: Secondary | ICD-10-CM | POA: Diagnosis not present

## 2017-06-07 DIAGNOSIS — B370 Candidal stomatitis: Secondary | ICD-10-CM | POA: Diagnosis not present

## 2017-06-07 MED ORDER — NYSTATIN 100000 UNIT/ML MT SUSP: 500000.0000 [IU] | Freq: Four times a day (QID) | OROMUCOSAL | 0 refills | Status: DC

## 2017-06-07 MED ORDER — FLUCONAZOLE 100 MG PO TABS: ORAL_TABLET | ORAL | 0 refills | Status: DC

## 2017-06-07 MED ORDER — FLUTICASONE-UMECLIDIN-VILANT 100-62.5-25 MCG/INH IN AEPB: 1.0000 | INHALATION_SPRAY | Freq: Every day | RESPIRATORY_TRACT | 5 refills | Status: DC

## 2017-06-07 NOTE — Telephone Encounter (Signed)
Can send script for fluconazole 100 mg pill >> 2 pills on day 1, then 1 pill daily for 6 days.  Dispense 8 pills with no refills.

## 2017-06-07 NOTE — Progress Notes (Signed)
Brandy Russell, Critical Care, and Sleep Medicine  Chief Complaint  Patient presents with  . Follow-up    Pt was put on doxy by TP and after that course of abx, pt stated she was still sick and had to be placed on a second round of abx. Pt stated she had mild dehydration due to diarrhea but once that cleared up, she was better.  Pt has c/o productive cough and increased SOB.     Vital signs: BP 134/70 (BP Location: Left Arm, Cuff Size: Normal)   Pulse 88   Ht 5\' 7"  (1.702 m)   Wt 117 lb (53.1 kg)   SpO2 95%   BMI 18.32 kg/m   History of Present Illness: Brandy Russell is a 62 y.o. 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  She was treated for an exacerbation in February.  Needed two rounds of Abx.  Better now.  Still gets winded but not as bad.  Has dry cough.  No wheeze, or sputum.  She has noticed more pain in left hip.  Has sore throat.   Physical Exam:  General - pleasant Eyes - pupils reactive, wears glasses ENT - no sinus tenderness, white build up on posterior pharynx, no LAN Cardiac - regular, no murmur Chest - no wheeze, rales Abd - soft, non tender Ext - no edema Skin - no rashes Neuro - normal strength Psych - normal mood  Assessment/Plan:  COPD with emphysema. - symptoms have progressed - will try her on trelegy in place of advair - prn albuterol - encouraged her to resume regular exercise regimen after she has her hip pain assessed by PCP  Dynamic hyperinflation. - refilled xanax  Thrush. - nystatin swish and swallow  Lung cancer screening. - f/u CT chest in July 2019   Patient Instructions  Nystatin swish and swallow 5 ml four times daily for 5 days  Trelegy one puff daily, and rinse mouth after each use  Stop advair while using Trelegy  Follow up in 6 months    Chesley Mires, MD [facility] 06/07/2017, 2:49 PM Pager:  [phone]  Flow Sheet  Russell tests: CT chest 04/04/09 >> apical centrilobular emphysema, 3 mm  RUL nodule, 4 mm RUL nodule Spirometry 05/30/12 >> FEV1 0.69 (26%), FEV1% 40 A1AT 05/30/12 >> 112, PI*MS ONO with RA 07/03/12 >> Test time 8 hrs 33 min.  Mean SpO2 92.6%, low SpO2 87%.  Spent 8 min with SpO2 < 88%. Low dose CT chest 10/03/14 >> multiple Russell nodules up to 5 mm, mild centrilobular and paraseptal emphysema Sputum 07/23/15 >> Pseudomonas aeruginosa Low dose CT chest 10/06/15 >> atherosclerosis, mod centrilobular emphysema, bronchial wall thickening, scattered nodules up to 3.6 mm Esophagram 11/25/15 >> no specific abnormality  Cardiac tests Echo 02/08/05 >> EF 40 to 50%  Past Medical History: She  has a past medical history of Anxiety and depression, CAD (coronary artery disease), Constipation, Emphysema, Herpes, and Seasonal allergies.  Past Surgical History: She  has a past surgical history that includes Tonsillectomy and adenoidectomy (1962) and Dilation and curettage of uterus.  Family History: Her family history includes Alcohol abuse in her father and mother; Emphysema in her father and mother; Hypertension in her father; Lung cancer in her father; Melanoma in her brother; Uterine cancer in her mother.  Social History: She  reports that she quit smoking about 11 years ago. Her smoking use included cigarettes. She has a 70.00 pack-year smoking history. she has never used smokeless tobacco. She reports  that she does not drink alcohol or use drugs.  Medications: Allergies as of 06/07/2017      Reactions   Penicillins    Sulfa Antibiotics    GI upset per the pt      Medication List        Accurate as of 06/07/17  2:49 PM. Always use your most recent med list.          acyclovir 400 MG tablet Commonly known as:  ZOVIRAX Take 1 tablet (400 mg total) by mouth 5 (five) times daily.   albuterol 108 (90 Base) MCG/ACT inhaler Commonly known as:  PROAIR HFA Inhale 2 puffs into the lungs every 6 (six) hours as needed for wheezing or shortness of breath.     ALPRAZolam 0.25 MG tablet Commonly known as:  XANAX Take 1 tablet (0.25 mg total) by mouth 3 (three) times daily as needed.   buPROPion 150 MG 12 hr tablet Commonly known as:  WELLBUTRIN SR TAKE 1 TABLET BY MOUTH TWICE DAILY   Fluticasone-Umeclidin-Vilant 100-62.5-25 MCG/INH Aepb Commonly known as:  TRELEGY ELLIPTA Inhale 1 puff into the lungs daily.   ibuprofen 200 MG tablet Commonly known as:  ADVIL,MOTRIN Take 200 mg by mouth every 6 (six) hours as needed.   nystatin 100000 UNIT/ML suspension Commonly known as:  MYCOSTATIN Take 5 mLs (500,000 Units total) by mouth 4 (four) times daily.   omeprazole 20 MG capsule Commonly known as:  PRILOSEC Take 20 mg by mouth 2 (two) times daily before a meal.

## 2017-06-07 NOTE — Telephone Encounter (Signed)
Dr. Halford Chessman, Nystatin has been on back order for quite some time. Most of the local pharmacies are completely out of it and are not sure when it become available again.   Is there another medication we can give the patient? Please advise. Thanks!

## 2017-06-07 NOTE — Patient Instructions (Signed)
Nystatin swish and swallow 5 ml four times daily for 5 days  Trelegy one puff daily, and rinse mouth after each use  Stop advair while using Trelegy  Follow up in 6 months

## 2017-06-07 NOTE — Telephone Encounter (Signed)
New medication has been called in.

## 2017-06-08 ENCOUNTER — Encounter: Payer: Self-pay | Admitting: Internal Medicine

## 2017-06-13 ENCOUNTER — Encounter: Payer: Self-pay | Admitting: Pulmonary Disease

## 2017-06-13 NOTE — Telephone Encounter (Signed)
Per pt, trelegy has been working well for her.  Stated to pt I would let Dr. Halford Chessman know it has been working good for her.  Routing to Dr. Halford Chessman as an Juluis Rainier.

## 2017-07-03 ENCOUNTER — Encounter: Payer: Self-pay | Admitting: Internal Medicine

## 2017-07-06 ENCOUNTER — Ambulatory Visit: Payer: Medicare Other | Admitting: Pulmonary Disease

## 2017-07-06 ENCOUNTER — Encounter: Payer: Self-pay | Admitting: Pulmonary Disease

## 2017-07-06 ENCOUNTER — Telehealth: Payer: Self-pay | Admitting: Pulmonary Disease

## 2017-07-06 ENCOUNTER — Ambulatory Visit (INDEPENDENT_AMBULATORY_CARE_PROVIDER_SITE_OTHER)
Admission: RE | Admit: 2017-07-06 | Discharge: 2017-07-06 | Disposition: A | Discharge status: Home / Self Care | Payer: Medicare Other | Source: Ambulatory Visit | Attending: Pulmonary Disease | Admitting: Pulmonary Disease

## 2017-07-06 VITALS — BP 132/78 | HR 109 | Temp 100.8°F | Ht 66.0 in | Wt 113.4 lb

## 2017-07-06 DIAGNOSIS — R509 Fever, unspecified: Secondary | ICD-10-CM

## 2017-07-06 DIAGNOSIS — J441 Chronic obstructive pulmonary disease with (acute) exacerbation: Secondary | ICD-10-CM

## 2017-07-06 LAB — POCT INFLUENZA A/B
INFLUENZA A, POC: NEGATIVE
Influenza B, POC: NEGATIVE

## 2017-07-06 MED ORDER — AZITHROMYCIN 250 MG PO TABS: 250.0000 mg | ORAL_TABLET | ORAL | 0 refills | Status: DC

## 2017-07-06 MED ORDER — PREDNISONE 10 MG PO TABS: ORAL_TABLET | ORAL | 0 refills | Status: DC

## 2017-07-06 NOTE — Patient Instructions (Signed)
Your flu test was negative today We will treat you for acute bronchitis, COPD exacerbation with azithromycin and a prednisone taper starting at 40 mg.  Reduce dose by 10 mg every 3 days Get a chest x-ray today. Use Mucinex over-the-counter and stay well-hydrated Continue using the trelegy inhaler and albuterol as needed Please call us if there is no improvement over the weekend Follow-up with Dr. Halford Chessman as scheduled.

## 2017-07-06 NOTE — Telephone Encounter (Signed)
Called and spoke with Brandy Russell letting her know because of her symptoms, we needed to schedule an appt for her instead of just calling a script in.  Brandy Russell expressed understanding.  Acute visit scheduled for Brandy Russell today with Dr. Vaughan Browner at 1:30.  Nothing further needed at this time.

## 2017-07-06 NOTE — Progress Notes (Signed)
Brandy Russell    [id number]    October 12, 1955  Primary Care Physician:Tisovec, Fransico Him, MD  Referring Physician: Haywood Pao, MD [address] [place], [place] [address]  Chief complaint: Acute visit for fevers, chills  HPI: 62 year old with very severe COPD Comes to the clinic today with 2 days of acute dyspnea, cough with green/dark mucus production, wheezing, fevers 101 at home.  No sick contacts.  History noted for previous episode of COPD exacerbation in early 2019 requiring 2 courses of antibiotic.  She was recently switched to trelegy and states that it is working well for her.   Outpatient Encounter Medications as of 07/06/2017  Medication Sig  . acyclovir (ZOVIRAX) 400 MG tablet Take 1 tablet (400 mg total) by mouth 5 (five) times daily. (Patient not taking: Reported on 06/07/2017)  . albuterol (PROAIR HFA) 108 (90 Base) MCG/ACT inhaler Inhale 2 puffs into the lungs every 6 (six) hours as needed for wheezing or shortness of breath.  . ALPRAZolam (XANAX) 0.25 MG tablet Take 1 tablet (0.25 mg total) by mouth 3 (three) times daily as needed.  Marland Kitchen buPROPion (WELLBUTRIN SR) 150 MG 12 hr tablet TAKE 1 TABLET BY MOUTH TWICE DAILY  . fluconazole (DIFLUCAN) 100 MG tablet Take 2 pills on first day, then 1 pill daily until finished.  . Fluticasone-Umeclidin-Vilant (TRELEGY ELLIPTA) 100-62.5-25 MCG/INH AEPB Inhale 1 puff into the lungs daily.  Marland Kitchen ibuprofen (ADVIL,MOTRIN) 200 MG tablet Take 200 mg by mouth every 6 (six) hours as needed.  . nystatin (MYCOSTATIN) 100000 UNIT/ML suspension Take 5 mLs (500,000 Units total) by mouth 4 (four) times daily.  Marland Kitchen omeprazole (PRILOSEC) 20 MG capsule Take 20 mg by mouth 2 (two) times daily before a meal.    No facility-administered encounter medications on file as of 07/06/2017.     Allergies as of 07/06/2017 - Review Complete 06/07/2017  Allergen Reaction Noted  . Penicillins  04/16/2012  . Sulfa antibiotics  04/16/2012    Past  Medical History:  Diagnosis Date  . Anxiety and depression   . CAD (coronary artery disease)    incidental finding on CT scan for lung ca screening  . Constipation   . Emphysema   . Herpes    face, uses suppresive therapy intermittently  . Seasonal allergies     Past Surgical History:  Procedure Laterality Date  . DILATION AND CURETTAGE OF UTERUS    . TONSILLECTOMY AND ADENOIDECTOMY  1962    Family History  Problem Relation Age of Onset  . Alcohol abuse Mother   . Uterine cancer Mother   . Emphysema Mother   . Alcohol abuse Father   . Lung cancer Father   . Hypertension Father   . Emphysema Father   . Melanoma Brother   . Colon cancer Neg Hx   . Esophageal cancer Neg Hx   . Rectal cancer Neg Hx   . Stomach cancer Neg Hx     Social History   Socioeconomic History  . Marital status: Divorced    Spouse name: Not on file  . Number of children: Not on file  . Years of education: Not on file  . Highest education level: Not on file  Occupational History  . Occupation: artists  Social Needs  . Financial resource strain: Not on file  . Food insecurity:    Worry: Not on file    Inability: Not on file  . Transportation needs:    Medical: Not  on file    Non-medical: Not on file  Tobacco Use  . Smoking status: Former Smoker    Packs/day: 2.00    Years: 35.00    Pack years: 70.00    Types: Cigarettes    Last attempt to quit: 03/28/2006    Years since quitting: 11.2  . Smokeless tobacco: Never Used  Substance and Sexual Activity  . Alcohol use: No  . Drug use: No  . Sexual activity: Never  Lifestyle  . Physical activity:    Days per week: Not on file    Minutes per session: Not on file  . Stress: Not on file  Relationships  . Social connections:    Talks on phone: Not on file    Gets together: Not on file    Attends religious service: Not on file    Active member of club or organization: Not on file    Attends meetings of clubs or organizations: Not on file      Relationship status: Not on file  . Intimate partner violence:    Fear of current or ex partner: Not on file    Emotionally abused: Not on file    Physically abused: Not on file    Forced sexual activity: Not on file  Other Topics Concern  . 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  HENT: Negative.   Eyes: Negative for blurred vision.  Respiratory: as per HPI  Cardiovascular: Negative for chest pain and palpitations.  Gastrointestinal: Negative for vomiting, diarrhea, blood per rectum. Genitourinary: Negative for dysuria, urgency, frequency and hematuria.  Musculoskeletal: Negative for myalgias, back pain and joint pain.  Skin: Negative for itching and rash.  Neurological: Negative for dizziness, tremors, focal weakness, seizures and loss of consciousness.  Endo/Heme/Allergies: Negative for environmental allergies.  Psychiatric/Behavioral: Negative for depression, suicidal ideas and hallucinations.  All other systems reviewed and are negative.  Physical Exam: Blood pressure (!) 160/90, pulse (!) 109, temperature (!) 100.8 F (38.2 C), temperature source Oral, height 5\' 6"  (1.676 m), weight 113 lb 6.4 oz (51.4 kg), SpO2 96 %. Gen:      No acute distress HEENT:  EOMI, sclera anicteric Neck:     No masses; no thyromegaly Lungs:    Diminished air entry, reduced breath sounds.  No obvious wheeze or crackle. CV:         Regular rate and rhythm; no murmurs Abd:      + bowel sounds; soft, non-tender; no palpable masses, no distension Ext:    No edema; adequate peripheral perfusion Skin:      Warm and dry; no rash Neuro: alert and oriented x 3 Psych: normal mood and affect  Data  Reviewed: Chest x-ray 05/02/17-hyperinflation, no acute lung abnormality Screening CT chest 12/07/16-emphysema, subcentimeter pulmonary nodules. I reviewed the images personally  Spirometry 05/30/12 FVC 1.71 (51%], FEV1 0.69 [26%], F/F 40 Very severe obstruction.  Assessment/Plan: COPD with exacerbation Flu test is negative Treat with Z-Pak, prednisone taper Chest x-ray Advised Mucinex over-the-counter, stay well-hydrated Continue trelegy and albuterol  Marshell Garfinkel MD [facility] 07/06/2017, 1:30 PM  CC: Tisovec, Fransico Him, MD

## 2017-07-07 ENCOUNTER — Other Ambulatory Visit: Payer: Self-pay

## 2017-07-07 DIAGNOSIS — J189 Pneumonia, unspecified organism: Secondary | ICD-10-CM

## 2017-07-08 ENCOUNTER — Other Ambulatory Visit: Payer: Self-pay | Admitting: Pulmonary Disease

## 2017-07-08 MED ORDER — LEVOFLOXACIN 750 MG PO TABS: 750.0000 mg | ORAL_TABLET | Freq: Every day | ORAL | 0 refills | Status: AC

## 2017-07-08 NOTE — Progress Notes (Signed)
Received call from patient via answering service. Patient reports stilll have fever to 103F and having ongoing coughing with chest pain. Unable to sleep.  Started on Z-pack on Thursday.  Patient not using acetaminophen or ibuprofen for fever.  Has not increased albuterol use.  Able to speak in full sentences over the phone. Short coughing fits during conversation.   CXR shows subtle RUL infiltrate.    Given persistent fevers with infiltrate, will switch antibiotics to Levaquin 750mg  for 7 days. Patient encouraged to alternate acetaminophen and ibuprofen for fever. She can use an OTC cough suppressant at bedtime in order to get some sleep.  Advised to call office if not better by Monday and to go to ED if becomes more dyspneic.  Kipp Brood, MD

## 2017-07-10 ENCOUNTER — Telehealth: Payer: Self-pay | Admitting: Pulmonary Disease

## 2017-07-10 NOTE — Telephone Encounter (Signed)
Call patient back offered appointment and patient declined felt like she couldn't drive with a fever that high.

## 2017-07-10 NOTE — Telephone Encounter (Signed)
Called and spoke to patient. Patient stated she is too weak to drive to an appointment today (was offered the 12:00 apt slot for VS). Patient stated she has been running a fever for 3 days up to 103. Patient stated she used the on call line over the weekend and Dr. Lynetta Mare prescribed Levaquin 750mg  PO once daily for 7 days. Patient reported that she is concerned that this is a high dose or strong medication because the pharmacist scared her with possible side effects so she hasn't started to take it yet. Patient would like to know what VS advises.   VS please advise.

## 2017-07-10 NOTE — Telephone Encounter (Signed)
She should start taking the antibiotic, but can take it every other day to complete a 7 day course of therapy.  If her symptoms get worse, then she needs to come in or go to the ER.

## 2017-07-10 NOTE — Telephone Encounter (Signed)
Called pt letting her know to begin taking Levaquin but that she could take it every other day until the course of abx is finished.  Stated to pt once abx was finished, if she was no better she needed to call our office to get scheduled for an appt.  Pt expressed understanding. Nothing further needed at this time.

## 2017-07-18 ENCOUNTER — Encounter: Payer: Self-pay | Admitting: Pulmonary Disease

## 2017-07-18 NOTE — Telephone Encounter (Signed)
-----   Message -----  From: Clement Husbands  Sent: 4/23/20198:45 AM EDT  To: Chesley Mires, MD Subject: Non-Urgent Medical Question  Last Monday, April 15th I was told to go ahead and take the levofloxacin 750 mg. every other day. I took it Monday, Tuesday, thursday, saturday and Monday (yesterday). (5 pills) I feel very well.I'd like to stop now even though I have two more pills. No bad side effects, it just feels odd.... like electricity and sleep is difficult.Please advise. Antony Blackbird.  Dr. Halford Chessman, please advise. Thanks!

## 2017-07-26 ENCOUNTER — Ambulatory Visit: Payer: Medicare Other | Admitting: Adult Health

## 2017-07-26 ENCOUNTER — Ambulatory Visit (INDEPENDENT_AMBULATORY_CARE_PROVIDER_SITE_OTHER)
Admission: RE | Admit: 2017-07-26 | Discharge: 2017-07-26 | Disposition: A | Discharge status: Home / Self Care | Payer: Medicare Other | Source: Ambulatory Visit | Attending: Adult Health | Admitting: Adult Health

## 2017-07-26 ENCOUNTER — Encounter: Payer: Self-pay | Admitting: Adult Health

## 2017-07-26 ENCOUNTER — Ambulatory Visit
Admission: RE | Admit: 2017-07-26 | Discharge: 2017-07-26 | Disposition: A | Discharge status: Home / Self Care | Payer: Medicare Other | Source: Ambulatory Visit | Attending: Adult Health | Admitting: Adult Health

## 2017-07-26 VITALS — BP 118/76 | HR 97 | Ht 67.0 in | Wt 114.0 lb

## 2017-07-26 DIAGNOSIS — J439 Emphysema, unspecified: Secondary | ICD-10-CM | POA: Diagnosis not present

## 2017-07-26 DIAGNOSIS — J189 Pneumonia, unspecified organism: Secondary | ICD-10-CM

## 2017-07-26 DIAGNOSIS — J181 Lobar pneumonia, unspecified organism: Secondary | ICD-10-CM | POA: Diagnosis not present

## 2017-07-26 MED ORDER — ALPRAZOLAM 0.25 MG PO TABS: 0.2500 mg | ORAL_TABLET | Freq: Three times a day (TID) | ORAL | 5 refills | Status: DC | PRN

## 2017-07-26 NOTE — Patient Instructions (Addendum)
Continue TRELEGY daily , rinse after use .  Follow up with Dr. Halford Chessman  In 4 weeks with chest xray and As needed   Please contact office for sooner follow up if symptoms do not improve or worsen or seek emergency care

## 2017-07-26 NOTE — Assessment & Plan Note (Signed)
Recent exacerbation now resolved.  Plan  Patient Instructions  Continue TRELEGY daily , rinse after use .  Follow up with Dr. Halford Chessman  In 4 weeks with chest xray and As needed   Please contact office for sooner follow up if symptoms do not improve or worsen or seek emergency care

## 2017-07-26 NOTE — Progress Notes (Signed)
Reviewed and agree with assessment/plan.   Mickel Schreur, MD [facility] 03/23/2016, 12:24 PM Pager:  [phone]  

## 2017-07-26 NOTE — Assessment & Plan Note (Signed)
Right upper lobe pneumonia treated with Levaquin.  Patient has significant clinical improvement.  Chest x-ray shows persistent nodularity in the right upper lobe.  She will need follow-up chest x-ray in 3 to 4 weeks.  Plan  Patient Instructions  Continue TRELEGY daily , rinse after use .  Follow up with Dr. Halford Chessman  In 4 weeks with chest xray and As needed   Please contact office for sooner follow up if symptoms do not improve or worsen or seek emergency care

## 2017-07-26 NOTE — Progress Notes (Signed)
@Patient  ID: Brandy Russell, female    DOB: January 21, 1956, 62 y.o.   MRN: [id number]  Chief Complaint  Patient presents with  . Follow-up    PNA     Referring provider: Haywood Pao, MD  HPI: 62 year old female former smoker followed for COPD with emphysema  Pulmonary tests CT chest 04/04/09 >> apical centrilobular emphysema, 3 mm RUL nodule, 4 mm RUL nodule Spirometry 05/30/12 >> FEV1 0.69 (26%), FEV1% 40 A1AT 05/30/12 >> 112, PI*MS ONO with RA 07/03/12 >>Test time 8 hrs 33 min. Mean SpO2 92.6%, low SpO2 87%. Spent 8 min with SpO2 <88%. Low dose CT chest 10/03/14 >> multiple pulmonary nodules up to 5 mm, mild centrilobular and paraseptal emphysema Sputum 07/23/15 >> Pseudomonas aeruginosa Low dose CT chest 10/06/15 >> atherosclerosis, mod centrilobular emphysema, bronchial wall thickening, scattered nodules up to 3.6 mm Esophagram 11/25/15 >> no specific abnormality  Cardiac tests Echo 02/08/05 >> EF 40 to 50%  07/26/2017 Follow up : PNA  Patient presents for a 3-week follow-up.  Patient was seen last visit for a COPD exacerbation and pneumonia.  Chest x-ray showed a right upper lobe nodular infiltrate.  She was initially treated with a Z-Pak but did not have any clinical improvement.  And was changed over to Levaquin 750 mg daily.  Patient felt that this was too strong and was instructed to take every other day.  Patient says she is feeling much better.  She does in fact this is the best she is felt in 10 years.  She has been more active and has increased appetite.  She says she is been able to walk the most in a long time. He denies any fever, chest pain, orthopnea, edema. Chest x-ray today was reviewed with persistent mild right upper lobe nodularity.  Patient does have underlying anxiety.  She says this affects her breathing.  And has been using Xanax which helps with this.  Request a refill.  Education given.      Allergies  Allergen Reactions  . Penicillins   . Sulfa  Antibiotics     GI upset per the pt    Immunization History  Administered Date(s) Administered  . Influenza Split 12/26/2016  . Influenza Whole 12/27/2011, 12/04/2012  . Influenza,inj,Quad PF,6+ Mos 11/27/2014, 12/27/2015  . Influenza-Unspecified 12/09/2013  . Pneumococcal Conjugate-13 11/17/2015  . Pneumococcal Polysaccharide-23 03/28/2005, 01/09/2013  . Zoster 09/26/2014    Past Medical History:  Diagnosis Date  . Anxiety and depression   . CAD (coronary artery disease)    incidental finding on CT scan for lung ca screening  . Constipation   . Emphysema   . Herpes    face, uses suppresive therapy intermittently  . Seasonal allergies     Tobacco History: Social History   Tobacco Use  Smoking Status Former Smoker  . Packs/day: 2.00  . Years: 35.00  . Pack years: 70.00  . Types: Cigarettes  . Last attempt to quit: 03/28/2006  . Years since quitting: 11.3  Smokeless Tobacco Never Used   Counseling given: Not Answered   Outpatient Encounter Medications as of 07/26/2017  Medication Sig  . acyclovir (ZOVIRAX) 400 MG tablet Take 1 tablet (400 mg total) by mouth 5 (five) times daily.  Marland Kitchen albuterol (PROAIR HFA) 108 (90 Base) MCG/ACT inhaler Inhale 2 puffs into the lungs every 6 (six) hours as needed for wheezing or shortness of breath.  . ALPRAZolam (XANAX) 0.25 MG tablet Take 1 tablet (0.25 mg total) by mouth 3 (  three) times daily as needed.  Marland Kitchen buPROPion (WELLBUTRIN SR) 150 MG 12 hr tablet TAKE 1 TABLET BY MOUTH TWICE DAILY  . Fluticasone-Umeclidin-Vilant (TRELEGY ELLIPTA) 100-62.5-25 MCG/INH AEPB Inhale 1 puff into the lungs daily.  Marland Kitchen ibuprofen (ADVIL,MOTRIN) 200 MG tablet Take 200 mg by mouth every 6 (six) hours as needed.  Marland Kitchen omeprazole (PRILOSEC) 20 MG capsule Take 20 mg by mouth 2 (two) times daily before a meal.   . [DISCONTINUED] ALPRAZolam (XANAX) 0.25 MG tablet Take 1 tablet (0.25 mg total) by mouth 3 (three) times daily as needed.  . [DISCONTINUED] azithromycin  (ZITHROMAX) 250 MG tablet Take 1 tablet (250 mg total) by mouth as directed.  . [DISCONTINUED] predniSONE (DELTASONE) 10 MG tablet 40mg  x3days, 30mg  x3 days, 20mg  x3 days, 10mg  x3 days, then stop   No facility-administered encounter medications on file as of 07/26/2017.      Review of Systems  Constitutional:   No  weight loss, night sweats,  Fevers, chills, fatigue, or  lassitude.  HEENT:   No headaches,  Difficulty swallowing,  Tooth/dental problems, or  Sore throat,                No sneezing, itching, ear ache, nasal congestion, post nasal drip,   CV:  No chest pain,  Orthopnea, PND, swelling in lower extremities, anasarca, dizziness, palpitations, syncope.   GI  No heartburn, indigestion, abdominal pain, nausea, vomiting, diarrhea, change in bowel habits, loss of appetite, bloody stools.   Resp:    No excess mucus, no productive cough,  No non-productive cough,  No coughing up of blood.  No change in color of mucus.  No wheezing.  No chest wall deformity  Skin: no rash or lesions.  GU: no dysuria, change in color of urine, no urgency or frequency.  No flank pain, no hematuria   MS:  No joint pain or swelling.  No decreased range of motion.  No back pain.    Physical Exam  BP 118/76 (BP Location: Left Arm, Cuff Size: Normal)   Pulse 97   Ht 5\' 7"  (1.702 m)   Wt 114 lb (51.7 kg)   SpO2 96%   BMI 17.85 kg/m   GEN: A/Ox3; pleasant , NAD, thin and frail    HEENT:  [place]/AT,  EACs-clear, TMs-wnl, NOSE-clear, THROAT-clear, no lesions, no postnasal drip or exudate noted.   NECK:  Supple w/ fair ROM; no JVD; normal carotid impulses w/o bruits; no thyromegaly or nodules palpated; no lymphadenopathy.    RESP  Clear  P & A; w/o, wheezes/ rales/ or rhonchi. no accessory muscle use, no dullness to percussion  CARD:  RRR, no m/r/g, no peripheral edema, pulses intact, no cyanosis or clubbing.  GI:   Soft & nt; nml bowel sounds; no organomegaly or masses detected.   Musco: Warm bil,  no deformities or joint swelling noted.   Neuro: alert, no focal deficits noted.    Skin: Warm, no lesions or rashes    Lab Results:   ProBNP No results found for: PROBNP  Imaging: Dg Chest 2 View  Result Date: 07/26/2017 CLINICAL DATA:  Pneumonia. EXAM: CHEST - 2 VIEW COMPARISON:  Radiographs of July 06, 2017. FINDINGS: The heart size and mediastinal contours are within normal limits. Hyperexpansion of the lungs is noted. No pneumothorax or pleural effusion is noted. Left lung is clear. Stable right upper lobe opacity is noted medially which may represent inflammation but is not significantly changed compared to prior exam. The visualized skeletal  structures are unremarkable. IMPRESSION: Stable right upper lobe opacity is noted which is not significantly changed compared to prior exam. This may represent residual pneumonia or possibly post infectious scarring. Continued radiographic follow-up is recommended to ensure resolution. Electronically Signed   By: Marijo Conception, M.D.   On: 07/26/2017 11:58   Dg Chest 2 View  Result Date: 07/07/2017 CLINICAL DATA:  Cough and congestion.  Shortness of breath.  Fever. EXAM: CHEST - 2 VIEW COMPARISON:  05/02/2017. FINDINGS: Mediastinum and hilar structures are normal. Mild nodular infiltrate right upper lobe. Bilateral nipple shadows again noted. No pleural effusion or pneumothorax. Heart size normal. No acute bony abnormality. IMPRESSION: Mild nodular infiltrate right upper lobe. Findings most likely secondary to pneumonia. Followup PA and lateral chest X-ray is recommended in 3-4 weeks following trial of antibiotic therapy to ensure resolution and exclude underlying malignancy. Electronically Signed   By: Marcello Moores  Register   On: 07/07/2017 07:44     Assessment & Plan:   COPD with emphysema Recent exacerbation now resolved.  Plan  Patient Instructions  Continue TRELEGY daily , rinse after use .  Follow up with Dr. Halford Chessman  In 4 weeks with chest  xray and As needed   Please contact office for sooner follow up if symptoms do not improve or worsen or seek emergency care       Community acquired pneumonia Right upper lobe pneumonia treated with Levaquin.  Patient has significant clinical improvement.  Chest x-ray shows persistent nodularity in the right upper lobe.  She will need follow-up chest x-ray in 3 to 4 weeks.  Plan  Patient Instructions  Continue TRELEGY daily , rinse after use .  Follow up with Dr. Halford Chessman  In 4 weeks with chest xray and As needed   Please contact office for sooner follow up if symptoms do not improve or worsen or seek emergency care          [name], NP 07/26/2017

## 2017-08-23 ENCOUNTER — Ambulatory Visit (INDEPENDENT_AMBULATORY_CARE_PROVIDER_SITE_OTHER)
Admission: RE | Admit: 2017-08-23 | Discharge: 2017-08-23 | Disposition: A | Discharge status: Home / Self Care | Payer: Medicare Other | Source: Ambulatory Visit | Attending: Adult Health | Admitting: Adult Health

## 2017-08-23 ENCOUNTER — Ambulatory Visit (INDEPENDENT_AMBULATORY_CARE_PROVIDER_SITE_OTHER): Payer: Medicare Other | Admitting: Pulmonary Disease

## 2017-08-23 ENCOUNTER — Encounter: Payer: Self-pay | Admitting: Pulmonary Disease

## 2017-08-23 VITALS — BP 128/72 | HR 84 | Ht 67.0 in | Wt 116.0 lb

## 2017-08-23 DIAGNOSIS — J189 Pneumonia, unspecified organism: Secondary | ICD-10-CM | POA: Diagnosis not present

## 2017-08-23 DIAGNOSIS — J432 Centrilobular emphysema: Secondary | ICD-10-CM

## 2017-08-23 MED ORDER — ALBUTEROL SULFATE HFA 108 (90 BASE) MCG/ACT IN AERS: 2.0000 | INHALATION_SPRAY | Freq: Four times a day (QID) | RESPIRATORY_TRACT | 3 refills | Status: DC | PRN

## 2017-08-23 NOTE — Patient Instructions (Signed)
Follow up in 6 months 

## 2017-08-23 NOTE — Progress Notes (Signed)
[facility]  Chief Complaint  Patient presents with  . Follow-up    Pt had CXR prior to OV today. Pt has productive cough- not sure of color, and some wheezing and SOB with exertion. Pt is trying to exercise more each day, doing well with it.     Vital signs: BP 128/72 (BP Location: Left Arm, Cuff Size: Normal)   Pulse 84   Ht 5\' 7"  (1.702 m)   Wt 116 lb (52.6 kg)   SpO2 98%   BMI 18.17 kg/m   History of Present Illness: Brandy Russell is a 62 y.o. 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  Since I last saw her she was treated for pneumonia.  She is feeling better.  She has occasional cough with clear sputum.  Back to exercising.  She had trouble with ankle pain when on levaquin.     Physical Exam:  General - pleasant Eyes - pupils reactive, wears glasses ENT - no sinus tenderness, no oral exudate, no LAN Cardiac - regular, no murmur Chest - no wheeze, rales Abd - soft, non tender Ext - no edema Skin - no rashes Neuro - normal strength Psych - normal mood   Dg Chest 2 View  Result Date: 08/23/2017 CLINICAL DATA:  Follow-up pneumonia.  Continued cough. EXAM: CHEST - 2 VIEW COMPARISON:  07/26/2017 FINDINGS: Right upper lobe pneumonia has further cleared. Trace linear opacities persist in the medial right upper lung. Lungs remain hyperaerated. Normal heart size. No pneumothorax or pleural effusion. IMPRESSION: Right upper lobe pneumonia has nearly resolved. Trace linear opacities persist in the right upper lobe. Persistent findings may simply represent post inflammatory scarring. Electronically Signed   By: Marybelle Killings M.D.   On: 08/23/2017 20:39    Assessment/Plan:  COPD with emphysema. - continue trelegy and prn albuterol  Dynamic hyperinflation. - prn xanax  Lung cancer screening. - f/u CT chest in July 2019  Recent episode of pneumonia. - resolved   Patient Instructions  Follow up in  6 months   Chesley Mires, MD [place] 08/23/2017, 5:12 PM Pager:  [phone]  Flow Sheet  Pulmonary tests: CT chest 04/04/09 >> apical centrilobular emphysema, 3 mm RUL nodule, 4 mm RUL nodule Spirometry 05/30/12 >> FEV1 0.69 (26%), FEV1% 40 A1AT 05/30/12 >> 112, PI*MS ONO with RA 07/03/12 >> Test time 8 hrs 33 min.  Mean SpO2 92.6%, low SpO2 87%.  Spent 8 min with SpO2 < 88%. Low dose CT chest 10/03/14 >> multiple pulmonary nodules up to 5 mm, mild centrilobular and paraseptal emphysema Sputum 07/23/15 >> Pseudomonas aeruginosa Low dose CT chest 10/06/15 >> atherosclerosis, mod centrilobular emphysema, bronchial wall thickening, scattered nodules up to 3.6 mm Esophagram 11/25/15 >> no specific abnormality  Cardiac tests Echo 02/08/05 >> EF 40 to 50%  Past Medical History: She  has a past medical history of Anxiety and depression, CAD (coronary artery disease), Constipation, Emphysema, Herpes, and Seasonal allergies.  Past Surgical History: She  has a past surgical history that includes Tonsillectomy and adenoidectomy (1962) and Dilation and curettage of uterus.  Family History: Her family history includes Alcohol abuse in her father and mother; Emphysema in her father and mother; Hypertension in her father; Lung cancer in her father; Melanoma in her brother; Uterine cancer in her mother.  Social History: She  reports that she quit smoking about 11 years ago. Her smoking use included cigarettes. She has a 70.00 pack-year smoking history. She has never  used smokeless tobacco. She reports that she does not drink alcohol or use drugs.  Medications: Allergies as of 08/23/2017      Reactions   Penicillins    Sulfa Antibiotics    GI upset per the pt      Medication List        Accurate as of 08/23/17  5:12 PM. Always use your most recent med list.          acyclovir 400 MG tablet Commonly known as:  ZOVIRAX Take 1 tablet (400 mg total) by mouth 5 (five) times daily.   albuterol 108 (90 Base)  MCG/ACT inhaler Commonly known as:  PROAIR HFA Inhale 2 puffs into the lungs every 6 (six) hours as needed for wheezing or shortness of breath.   ALPRAZolam 0.25 MG tablet Commonly known as:  XANAX Take 1 tablet (0.25 mg total) by mouth 3 (three) times daily as needed.   buPROPion 150 MG 12 hr tablet Commonly known as:  WELLBUTRIN SR TAKE 1 TABLET BY MOUTH TWICE DAILY   Fluticasone-Umeclidin-Vilant 100-62.5-25 MCG/INH Aepb Commonly known as:  TRELEGY ELLIPTA Inhale 1 puff into the lungs daily.   ibuprofen 200 MG tablet Commonly known as:  ADVIL,MOTRIN Take 200 mg by mouth every 6 (six) hours as needed.   omeprazole 20 MG capsule Commonly known as:  PRILOSEC Take 20 mg by mouth 2 (two) times daily before a meal.

## 2017-08-31 ENCOUNTER — Other Ambulatory Visit: Payer: Self-pay

## 2017-08-31 ENCOUNTER — Ambulatory Visit (AMBULATORY_SURGERY_CENTER): Payer: Self-pay | Admitting: *Deleted

## 2017-08-31 VITALS — Ht 67.0 in | Wt 116.4 lb

## 2017-08-31 DIAGNOSIS — Z86010 Personal history of colonic polyps: Secondary | ICD-10-CM

## 2017-08-31 NOTE — Progress Notes (Signed)
No egg or soy allergy known to patient  No issues with past sedation with any surgeries  or procedures, no intubation problems  No diet pills per patient No home 02 use per patient  No blood thinners per patient  Pt denies issues with constipation not currently since taking bone broth No A fib or A flutter  EMMI video sent to pt's e mail pt. declined

## 2017-09-04 ENCOUNTER — Encounter: Payer: Self-pay | Admitting: Internal Medicine

## 2017-09-12 ENCOUNTER — Encounter: Payer: Self-pay | Admitting: Internal Medicine

## 2017-11-09 ENCOUNTER — Other Ambulatory Visit: Payer: Self-pay

## 2017-11-09 ENCOUNTER — Other Ambulatory Visit: Payer: Self-pay | Admitting: Pulmonary Disease

## 2017-11-09 MED ORDER — FLUTICASONE-UMECLIDIN-VILANT 100-62.5-25 MCG/INH IN AEPB: 1.0000 | INHALATION_SPRAY | Freq: Every day | RESPIRATORY_TRACT | 5 refills | Status: DC

## 2017-11-21 ENCOUNTER — Telehealth: Payer: Self-pay | Admitting: Internal Medicine

## 2017-11-21 NOTE — Telephone Encounter (Signed)
This patient's colonoscopy is scheduled for 11/23/17 at 1330. Patient called to inform us that she has a fever of 100 today. Patient stating she is being treated for an ankle wound with Doxyclyine and Mupirocin ung.she was seen at an urgent care last week. Patient stating the wound does not look any better and is draining. Patient stating the wound is 3 inches in diameter. And she is trying to cough up phlegm because she has emphysemia and she wants to check the color.  Instructed the patient to call her PCP regarding fever and wound status. Should we proceed with prep tomorrow night and procedure scheduled for Thursday.

## 2017-11-21 NOTE — Telephone Encounter (Signed)
Spoke to her and told her we will cancel procedure and she will need to reschedule

## 2017-11-22 NOTE — Telephone Encounter (Signed)
Appoinment cancelled.

## 2017-11-23 ENCOUNTER — Encounter: Payer: Self-pay | Admitting: Internal Medicine

## 2017-12-19 ENCOUNTER — Other Ambulatory Visit: Payer: Self-pay | Admitting: Internal Medicine

## 2017-12-19 DIAGNOSIS — Z1231 Encounter for screening mammogram for malignant neoplasm of breast: Secondary | ICD-10-CM

## 2017-12-28 ENCOUNTER — Ambulatory Visit: Payer: Self-pay

## 2018-01-29 ENCOUNTER — Ambulatory Visit
Admission: RE | Admit: 2018-01-29 | Discharge: 2018-01-29 | Disposition: A | Discharge status: Home / Self Care | Payer: Medicare Other | Source: Ambulatory Visit | Attending: Internal Medicine | Admitting: Internal Medicine

## 2018-01-29 DIAGNOSIS — Z1231 Encounter for screening mammogram for malignant neoplasm of breast: Secondary | ICD-10-CM

## 2018-02-16 ENCOUNTER — Ambulatory Visit: Payer: Self-pay | Admitting: Pulmonary Disease

## 2018-02-26 ENCOUNTER — Ambulatory Visit: Payer: Self-pay | Admitting: Pulmonary Disease

## 2018-02-27 ENCOUNTER — Ambulatory Visit (INDEPENDENT_AMBULATORY_CARE_PROVIDER_SITE_OTHER): Payer: Medicare Other | Admitting: Pulmonary Disease

## 2018-02-27 ENCOUNTER — Encounter: Payer: Self-pay | Admitting: Pulmonary Disease

## 2018-02-27 VITALS — BP 142/80 | HR 74 | Ht 65.5 in | Wt 116.4 lb

## 2018-02-27 DIAGNOSIS — R0600 Dyspnea, unspecified: Secondary | ICD-10-CM

## 2018-02-27 DIAGNOSIS — R0609 Other forms of dyspnea: Secondary | ICD-10-CM | POA: Diagnosis not present

## 2018-02-27 DIAGNOSIS — J432 Centrilobular emphysema: Secondary | ICD-10-CM

## 2018-02-27 NOTE — Progress Notes (Signed)
[facility]  Chief Complaint  Patient presents with  . Follow-up    pt states that she is experiencing SOB, and some chest tightness    Constitutional:  BP (!) 142/80 (BP Location: Right Arm, Cuff Size: Normal)   Pulse 74   Ht 5' 5.5" (1.664 m)   Wt 116 lb 6.4 oz (52.8 kg)   SpO2 99%   BMI 19.08 kg/m   Past Medical History:  Anxiety, Depression, CAD, GERD, PNA  Brief Summary:  Brandy Russell is a 62 y.o. female former smoker with COPD and emphysema.  She has been feeling more fatigued.  She feels more short of breath with exertion. Her breathing is worse when the weather changes.    She is not having cough, sputum, wheeze, or leg swelling.  She is sleeping okay.  She isn't needing to use albuterol much.  Her mood has been okay.  Physical Exam:   Appearance - well kempt   ENMT - clear nasal mucosa, midline nasal  septum, no oral exudates, no LAN, trachea midline  Respiratory - normal chest wall, normal respiratory effort, no accessory muscle use, no wheeze/rales  CV - s1s2 regular rate and rhythm, no murmurs, no peripheral edema, radial pulses symmetric  GI - soft, non tender, no masses  Lymph - no adenopathy noted in neck and axillary areas  MSK - normal gait  Ext - no cyanosis, clubbing, or joint inflammation noted  Skin - no rashes, lesions, or ulcers  Neuro - normal strength, oriented x 3  Psych - normal mood and affect  Discussion:  She has progressive dyspnea, and fatigue.  While this could be related to natural progression of her COPD and emphysema, I think we also need to reassess her cardiac status.  Assessment/Plan:   Dyspnea, fatigue. - will arrange for ONO with room air and Echocardiogram  COPD with emphysema. - continue trelegy with prn albuterol  History of tobacco abuse. - will reorder follow up lung cancer screening low dose CT chest  Anxiety with dynamic hyperinflation. - prn  xanax   Patient Instructions  Will schedule Echocardiogram, overnight oxygen test and CT chest   Follow up in 4 months  Time spent 27 minutes  Chesley Mires, MD [place] Pager: [phone] 02/27/2018, 1:26 PM  Flow Sheet     Pulmonary tests:  Spirometry 05/30/12 >> FEV1 0.69 (26%), FEV1% 40 A1AT 05/30/12 >> 112, PI*MS ONO with RA 07/03/12 >>Test time 8 hrs 33 min. Mean SpO2 92.6%, low SpO2 87%. Spent 8 min with SpO2 <88%. Sputum 07/23/15 >> Pseudomonas aeruginosa  Chest imaging:  CT chest 04/04/09 >> apical centrilobular emphysema, 3 mm RUL nodule, 4 mm RUL nodule Low dose CT chest 10/03/14 >> multiple pulmonary nodules up to 5 mm, mild centrilobular and paraseptal emphysema Low dose CT chest 10/06/15 >> atherosclerosis, mod centrilobular emphysema, bronchial wall thickening, scattered nodules up to 3.6 mm Esophagram 11/25/15 >> no specific abnormality Low dose CT chest 10/06/16 >> no change  Cardiac tests:  Echo 02/08/05 >> EF 40 to 50%  Medications:   Allergies as of 02/27/2018      Reactions   Penicillins    Sulfa Antibiotics    GI upset per the pt      Medication List        Accurate as of 02/27/18  1:26 PM. Always use your most recent med list.          acyclovir 400 MG tablet  Commonly known as:  ZOVIRAX Take 1 tablet (400 mg total) by mouth 5 (five) times daily.   albuterol 108 (90 Base) MCG/ACT inhaler Commonly known as:  PROVENTIL HFA;VENTOLIN HFA Inhale 2 puffs into the lungs every 6 (six) hours as needed for wheezing or shortness of breath.   ALPRAZolam 0.25 MG tablet Commonly known as:  XANAX Take 1 tablet (0.25 mg total) by mouth 3 (three) times daily as needed.   bisacodyl 5 MG EC tablet Commonly known as:  DULCOLAX Take 5 mg by mouth daily as needed for moderate constipation. Use as directed per colonoscopy instructions   buPROPion 150 MG 12 hr tablet Commonly known as:  WELLBUTRIN SR TAKE 1 TABLET BY MOUTH TWICE  DAILY   diclofenac sodium 1 % Gel Commonly known as:  VOLTAREN Apply 1 application topically daily as needed.   Fluticasone-Umeclidin-Vilant 100-62.5-25 MCG/INH Aepb Inhale 1 puff into the lungs daily at 6 (six) AM.   ibuprofen 200 MG tablet Commonly known as:  ADVIL,MOTRIN Take 200 mg by mouth every 6 (six) hours as needed.   omeprazole 20 MG capsule Commonly known as:  PRILOSEC Take 20 mg by mouth 2 (two) times daily before a meal.       Past Surgical History:  She  has a past surgical history that includes Tonsillectomy and adenoidectomy (1962); Dilation and curettage of uterus; Colonoscopy; and Polypectomy.  Family History:  Her family history includes Alcohol abuse in her father and mother; Emphysema in her father and mother; Hypertension in her father; Lung cancer in her father; Melanoma in her brother; Uterine cancer in her mother.  Social History:  She  reports that she quit smoking about 11 years ago. Her smoking use included cigarettes. She has a 70.00 pack-year smoking history. She has never used smokeless tobacco. She reports that she does not drink alcohol or use drugs.

## 2018-02-27 NOTE — Patient Instructions (Signed)
Will schedule Echocardiogram, overnight oxygen test and CT chest   Follow up in 4 months

## 2018-03-02 ENCOUNTER — Telehealth: Payer: Self-pay | Admitting: Pulmonary Disease

## 2018-03-02 MED ORDER — LEVOFLOXACIN 500 MG PO TABS: 500.0000 mg | ORAL_TABLET | Freq: Every day | ORAL | 0 refills | Status: DC

## 2018-03-02 MED ORDER — DOXYCYCLINE HYCLATE 100 MG PO TABS: 100.0000 mg | ORAL_TABLET | Freq: Two times a day (BID) | ORAL | 0 refills | Status: DC

## 2018-03-02 NOTE — Telephone Encounter (Signed)
Can send script for doxycyline 100 mg bid for 7 days.

## 2018-03-02 NOTE — Telephone Encounter (Signed)
Call made to patient, she states the last time she took levaquin it made her tendons in her ankles ache for weeks and she wants to make sure it is okay for her to take it again. She denies any other reactions to any other antibiotic.   Allergies: Penicillins and Sulfa Antibiotics.   VS please advise. Thanks.

## 2018-03-02 NOTE — Telephone Encounter (Signed)
Called and spoke with Patient.  She stated that last night she had a fever of 101, chills.  She said that she was experiencing SHOB, with little exertion.  She checked her sats, and it was running in the high 80's.  She had a increased, nonproductive cough, and hoarseness.  She said that she did feel better today, temp is 98.8.  Sats back in the 90's, but is still coughing more then usual.  She is worried, that it being Friday, she will get worse.  She does not want to be seen today, but would like recommendations or prescription.   Will route to Dr. Halford Chessman

## 2018-03-02 NOTE — Telephone Encounter (Signed)
Called and spoke with pt letting her know that VS said to send abx levaquin to her pharmacy for her. Stated to her that if she became worse over the weekend, VS stated to go to ER. Pt expressed understanding. I verified pt's preferred pharmacy and sent Rx in. Nothing further needed.

## 2018-03-02 NOTE — Telephone Encounter (Signed)
Called and spoke with pt letting her know that we were going to change the abx from levaquin to doxy for her. Pt expressed understanding and asked if we could place the levaquin on the list so she will not be prescribed that abx anymore due to it giving her tendonitis.  I stated to pt that I would add it to her list of allergies for her. Pt expressed understanding. Verified pt's preferred pharmacy and sent doxy in for her. Nothing further needed.

## 2018-03-02 NOTE — Telephone Encounter (Signed)
Pt is calling back [phone]

## 2018-03-02 NOTE — Telephone Encounter (Signed)
Attempted to call pt but unable to reach her. Left message for pt to return call. I have pended the Rx.

## 2018-03-02 NOTE — Telephone Encounter (Signed)
Can send script for levaquin 500 mg daily for 7 days.  If her symptoms get worse over the weekend, then she needs to go to the hospital.

## 2018-03-07 ENCOUNTER — Other Ambulatory Visit (HOSPITAL_COMMUNITY): Payer: Self-pay

## 2018-03-14 ENCOUNTER — Encounter: Payer: Self-pay | Admitting: Pulmonary Disease

## 2018-03-23 ENCOUNTER — Ambulatory Visit (INDEPENDENT_AMBULATORY_CARE_PROVIDER_SITE_OTHER)
Admission: RE | Admit: 2018-03-23 | Discharge: 2018-03-23 | Disposition: A | Discharge status: Home / Self Care | Payer: Medicare Other | Source: Ambulatory Visit | Attending: Pulmonary Disease | Admitting: Pulmonary Disease

## 2018-03-23 DIAGNOSIS — Z122 Encounter for screening for malignant neoplasm of respiratory organs: Secondary | ICD-10-CM | POA: Diagnosis not present

## 2018-03-23 DIAGNOSIS — Z87891 Personal history of nicotine dependence: Secondary | ICD-10-CM | POA: Diagnosis not present

## 2018-03-23 DIAGNOSIS — J432 Centrilobular emphysema: Secondary | ICD-10-CM

## 2018-03-26 ENCOUNTER — Telehealth: Payer: Self-pay | Admitting: Pulmonary Disease

## 2018-03-26 NOTE — Telephone Encounter (Signed)
Received call report from [facility], Diane.  She wanted to make sure providers are aware of patient's CT chest for lung cancer screening that was completed on Friday 03/23/18 has been viewed. She wanted to point out  the Lung-RADS 4AS, suspicious in the impression.   Routing to Eric Form, NP with the lung cancer screening program.

## 2018-03-29 ENCOUNTER — Telehealth: Payer: Self-pay | Admitting: Acute Care

## 2018-03-29 NOTE — Telephone Encounter (Signed)
Please see the results of Brandy Russell's Low Dose CT done 03/23/2018.  I have called her with the results. She understands she will have a follow up CT 05/2018  IMPRESSION: 1. Lung-RADS 4AS, suspicious. Follow up low-dose chest CT without contrast in 3 months (please use the following order, "CT CHEST LCS NODULE FOLLOW-UP W/O CM") is recommended. Alternatively, PET may be considered when there is a solid component 20mm or larger. 2. The "S" modifier above refers to potentially clinically significant non lung cancer related findings. Specifically, there is aortic atherosclerosis, in addition to 2 vessel coronary artery disease. Please note that although the presence of coronary artery calcium documents the presence of coronary artery disease, the severity of this disease and any potential stenosis cannot be assessed on this non-gated CT examination. Assessment for potential risk factor modification, dietary therapy or pharmacologic therapy may be warranted, if clinically indicated. 3. Enlarging low-attenuation liver lesion. Although this has a relatively benign appearance, it is clearly ground compared to the prior study, and appears to be associated with some intrahepatic biliary ductal dilatation in segment 2 of the liver. Further evaluation with nonemergent MRI of the abdomen with and without IV gadolinium is strongly recommended in the near future to better characterize this finding and identify the source of biliary tract obstruction. 4. Mild diffuse bronchial wall thickening with mild centrilobular and paraseptal emphysema; imaging findings suggestive of underlying COPD. 5. 5 mm nonobstructive calculus in the upper pole collecting system of the left kidney.  These results will be called to the ordering clinician or representative by the Radiologist Assistant, and communication documented in the PACS or zVision Dashboard.  Aortic Atherosclerosis (ICD10-I70.0) and Emphysema  (ICD10-J43.9).  Brandy Russell, please schedule her for a 3 month follow up CT. We will order and schedule the 3 month follow up scan for late March 2020.  Dr. Osborne Russell, please follow up on the incidental finding of Enlarging low-attenuation liver lesion if you feel this is clinically indicated as you know this patient's health history well.  Thanks so much.

## 2018-03-30 ENCOUNTER — Other Ambulatory Visit: Payer: Self-pay

## 2018-03-30 ENCOUNTER — Other Ambulatory Visit: Payer: Self-pay | Admitting: Acute Care

## 2018-03-30 ENCOUNTER — Ambulatory Visit (HOSPITAL_COMMUNITY): Payer: Medicare Other | Attending: Cardiology

## 2018-03-30 DIAGNOSIS — Z87891 Personal history of nicotine dependence: Secondary | ICD-10-CM

## 2018-03-30 DIAGNOSIS — R0609 Other forms of dyspnea: Secondary | ICD-10-CM

## 2018-03-30 DIAGNOSIS — Z122 Encounter for screening for malignant neoplasm of respiratory organs: Secondary | ICD-10-CM

## 2018-03-30 DIAGNOSIS — R0600 Dyspnea, unspecified: Secondary | ICD-10-CM

## 2018-04-04 ENCOUNTER — Encounter: Payer: Self-pay | Admitting: Pulmonary Disease

## 2018-04-04 ENCOUNTER — Ambulatory Visit: Payer: Medicare Other | Admitting: Pulmonary Disease

## 2018-04-04 VITALS — BP 122/78 | HR 88 | Ht 66.0 in | Wt 118.4 lb

## 2018-04-04 DIAGNOSIS — I25119 Atherosclerotic heart disease of native coronary artery with unspecified angina pectoris: Secondary | ICD-10-CM | POA: Diagnosis not present

## 2018-04-04 DIAGNOSIS — I5032 Chronic diastolic (congestive) heart failure: Secondary | ICD-10-CM

## 2018-04-04 DIAGNOSIS — R0609 Other forms of dyspnea: Secondary | ICD-10-CM

## 2018-04-04 DIAGNOSIS — R918 Other nonspecific abnormal finding of lung field: Secondary | ICD-10-CM | POA: Diagnosis not present

## 2018-04-04 DIAGNOSIS — R0600 Dyspnea, unspecified: Secondary | ICD-10-CM

## 2018-04-04 DIAGNOSIS — J432 Centrilobular emphysema: Secondary | ICD-10-CM

## 2018-04-04 NOTE — Telephone Encounter (Signed)
VS please advise did the order need to be a low dose CT or a CT without contrast.

## 2018-04-04 NOTE — Progress Notes (Signed)
[facility]  Chief Complaint  Patient presents with  . Follow-up    Pt has SOB with exertion, and some chest tightness. Pt has some productive cough, not sure of color. Pt states no better since last ov.    Constitutional:  BP 122/78 (BP Location: Left Arm, Cuff Size: Normal)   Pulse 88   Ht 5\' 6"  (1.676 m)   Wt 118 lb 6.4 oz (53.7 kg)   SpO2 98%   BMI 19.11 kg/m   Past Medical History:  Anxiety, Depression, CAD, GERD, PNA  Brief Summary:  Brandy Russell is a 63 y.o. female former smoker with COPD and emphysema.  She had low dose CT chest.  Had new nodule up to 7.7 mm.  Also had atherosclerosis and possible bile duct dilation.  Reviewed by me.  Echo showed diastolic dysfunction.  She gets bloated if she doesn't watch her diet.  She still gets winded with activity.  She has intermittent cough and wheeze.  Doesn't think she has trouble with her sleep.  Not having leg swelling.  Physical Exam:   Appearance - well kempt   ENMT - no sinus tenderness, no nasal discharge, no oral exudate  Neck - no masses, trachea midline, no thyromegaly, no elevation in JVP  Respiratory - normal appearance of chest wall, normal respiratory effort w/o accessory muscle use, no dullness on percussion, no tactile fremitus, no wheezing or rales  CV - s1s2 regular rate and rhythm, no murmurs, no peripheral edema, no varicosities, radial pulses symmetric  GI - soft, non tender, no masses, no hepatosplenomegaly  Lymph - no adenopathy noted in neck and axillary areas  MSK - normal gait  Ext - no cyanosis, clubbing, or joint inflammation noted  Skin - no rashes, lesions, or ulcers  Neuro - normal strength, oriented x 3  Psych - normal mood and affect   Discussion:  She has progressive dyspnea and fatigue.  She has atherosclerosis and diastolic CHF.  These could be contributing.  Assessment/Plan:   Dyspnea, fatigue. - will arrange for cardiology  referral to assess coronary atherosclerosis and diastolic CHF  COPD with emphysema. - continue trelegy with prn albuterol  Lung nodules. - will need f/u CT chest in 3 months  Anxiety with dynamic hyperinflation. - prn xanax  Bile duct dilation on CT chest. - she will contact her PCP to have this further assessed   Patient Instructions  Will arrange for referral to Cardiology  Will arrange for CT chest in March 2020 and follow up after that  Time spent 26 minutes  Chesley Mires, MD [place] Pager: [phone] 04/04/2018, 5:32 PM  Flow Sheet     Pulmonary tests:  Spirometry 05/30/12 >> FEV1 0.69 (26%), FEV1% 40 A1AT 05/30/12 >> 112, PI*MS ONO with RA 07/03/12 >>Test time 8 hrs 33 min. Mean SpO2 92.6%, low SpO2 87%. Spent 8 min with SpO2 <88%. Sputum 07/23/15 >> Pseudomonas aeruginosa  Chest imaging:  CT chest 04/04/09 >> apical centrilobular emphysema, 3 mm RUL nodule, 4 mm RUL nodule Low dose CT chest 10/03/14 >> multiple pulmonary nodules up to 5 mm, mild centrilobular and paraseptal emphysema Low dose CT chest 10/06/15 >> atherosclerosis, mod centrilobular emphysema, bronchial wall thickening, scattered nodules up to 3.6 mm Esophagram 11/25/15 >> no specific abnormality Low dose CT chest 10/06/16 >> no change Low dose CT chest 03/23/18 >> atherosclerosis, multiple nodules with new nodule 7.7 mm RUL, centrilobular and paraseptal emphysema, 3 cm lesion  in left lobe of liver, possible dilated bile duct  Cardiac tests:  Echo 02/08/05 >> EF 40 to 50% Echo 03/30/18 >> EF 55 to 60%, grade 2 DD  Medications:   Allergies as of 04/04/2018      Reactions   Levaquin [levofloxacin] Other (See Comments)   tendonitis   Penicillins    Sulfa Antibiotics    GI upset per the pt      Medication List       Accurate as of April 04, 2018  5:32 PM. Always use your most recent med list.        acyclovir 400 MG tablet Commonly known as:  ZOVIRAX Take 1 tablet  (400 mg total) by mouth 5 (five) times daily.   albuterol 108 (90 Base) MCG/ACT inhaler Commonly known as:  PROAIR HFA Inhale 2 puffs into the lungs every 6 (six) hours as needed for wheezing or shortness of breath.   ALPRAZolam 0.25 MG tablet Commonly known as:  XANAX Take 1 tablet (0.25 mg total) by mouth 3 (three) times daily as needed.   bisacodyl 5 MG EC tablet Commonly known as:  DULCOLAX Take 5 mg by mouth daily as needed for moderate constipation. Use as directed per colonoscopy instructions   buPROPion 150 MG 12 hr tablet Commonly known as:  WELLBUTRIN SR TAKE 1 TABLET BY MOUTH TWICE DAILY   diclofenac sodium 1 % Gel Commonly known as:  VOLTAREN Apply 1 application topically daily as needed.   doxycycline 100 MG tablet Commonly known as:  VIBRA-TABS Take 1 tablet (100 mg total) by mouth 2 (two) times daily.   Fluticasone-Umeclidin-Vilant 100-62.5-25 MCG/INH Aepb Commonly known as:  TRELEGY ELLIPTA Inhale 1 puff into the lungs daily at 6 (six) AM.   ibuprofen 200 MG tablet Commonly known as:  ADVIL,MOTRIN Take 200 mg by mouth every 6 (six) hours as needed.   omeprazole 20 MG capsule Commonly known as:  PRILOSEC Take 20 mg by mouth 2 (two) times daily before a meal.       Past Surgical History:  She  has a past surgical history that includes Tonsillectomy and adenoidectomy (1962); Dilation and curettage of uterus; Colonoscopy; and Polypectomy.  Family History:  Her family history includes Alcohol abuse in her father and mother; Emphysema in her father and mother; Hypertension in her father; Lung cancer in her father; Melanoma in her brother; Uterine cancer in her mother.  Social History:  She  reports that she quit smoking about 12 years ago. Her smoking use included cigarettes. She has a 70.00 pack-year smoking history. She has never used smokeless tobacco. She reports that she does not drink alcohol or use drugs.

## 2018-04-04 NOTE — Patient Instructions (Signed)
Will arrange for referral to Cardiology  Will arrange for CT chest in March 2020 and follow up after that

## 2018-04-04 NOTE — Telephone Encounter (Signed)
Closing encounter. Results were reviewed at OV with Dr. Halford Chessman. Nothing further needed.

## 2018-04-09 ENCOUNTER — Other Ambulatory Visit: Payer: Self-pay | Admitting: Pulmonary Disease

## 2018-04-09 DIAGNOSIS — R911 Solitary pulmonary nodule: Secondary | ICD-10-CM

## 2018-04-10 NOTE — Progress Notes (Signed)
Cardiology Office Note   Date:  04/12/2018   ID:  Brandy Russell, DOB [birth year], MRN [id number]  PCP:  Haywood Pao, MD  Cardiologist:   Jenkins Rouge, MD   No chief complaint on file.     History of Present Illness: Brandy Russell is a 63 y.o. female who presents for consultation regarding dyspnea and chronic diastolic CHF.  She sees pulmonary for emphysema and pulmonary nodules Because of progressive fatigue and dyspnea Dr Halford Chessman wanted her seen by cardiology   Echo reviewed from 03/30/18 EF 50-09% grade 2 diastolic dysfunction  No significant valve disease RV normal no signs of pulmonary HTN  She has no chest pain Fatigue and dyspnea worse. She does not appear to be volume overloaded Stopped smoking 10 years ago    Past Medical History:  Diagnosis Date  . Allergy   . Anxiety and depression   . CAD (coronary artery disease)    incidental finding on CT scan for lung ca screening  . Constipation   . Depression   . Emphysema   . Emphysema of lung ([place])   . GERD (gastroesophageal reflux disease)   . Herpes    face, uses suppresive therapy intermittently  . Seasonal allergies     Past Surgical History:  Procedure Laterality Date  . COLONOSCOPY    . DILATION AND CURETTAGE OF UTERUS    . POLYPECTOMY    . TONSILLECTOMY AND ADENOIDECTOMY  1962     Current Outpatient Medications  Medication Sig Dispense Refill  . acyclovir (ZOVIRAX) 400 MG tablet Take 1 tablet (400 mg total) by mouth 5 (five) times daily. 60 tablet 3  . albuterol (PROAIR HFA) 108 (90 Base) MCG/ACT inhaler Inhale 2 puffs into the lungs every 6 (six) hours as needed for wheezing or shortness of breath. 3 Inhaler 3  . ALPRAZolam (XANAX) 0.25 MG tablet Take 1 tablet (0.25 mg total) by mouth 3 (three) times daily as needed. 90 tablet 5  . bisacodyl (DULCOLAX) 5 MG EC tablet Take 5 mg by mouth daily as needed for moderate constipation. Use as directed per colonoscopy instructions    . buPROPion  (WELLBUTRIN SR) 150 MG 12 hr tablet TAKE 1 TABLET BY MOUTH TWICE DAILY 180 tablet 4  . diclofenac sodium (VOLTAREN) 1 % GEL Apply 1 application topically daily as needed.  2  . doxycycline (VIBRA-TABS) 100 MG tablet Take 1 tablet (100 mg total) by mouth 2 (two) times daily. 14 tablet 0  . Fluticasone-Umeclidin-Vilant (TRELEGY ELLIPTA) 100-62.5-25 MCG/INH AEPB Inhale 1 puff into the lungs daily at 6 (six) AM. 60 each 5  . ibuprofen (ADVIL,MOTRIN) 200 MG tablet Take 200 mg by mouth every 6 (six) hours as needed.    Marland Kitchen omeprazole (PRILOSEC) 20 MG capsule Take 20 mg by mouth 2 (two) times daily before a meal.     . pravastatin (PRAVACHOL) 20 MG tablet Take 1 tablet by mouth daily.  1   No current facility-administered medications for this visit.     Allergies:   Levaquin [levofloxacin]; Penicillins; and Sulfa antibiotics    Social History:  The patient  reports that she quit smoking about 12 years ago. Her smoking use included cigarettes. She has a 70.00 pack-year smoking history. She has never used smokeless tobacco. She reports that she does not drink alcohol or use drugs.   Family History:  The patient's family history includes Alcohol abuse in her father and mother; Emphysema in her father and mother; Hypertension  in her father; Lung cancer in her father; Melanoma in her brother; Uterine cancer in her mother.    ROS:  Please see the history of present illness.   Otherwise, review of systems are positive for none.   All other systems are reviewed and negative.    PHYSICAL EXAM: VS:  BP (!) 148/86   Pulse 76   Ht 5\' 6"  (1.676 m)   Wt 118 lb (53.5 kg)   SpO2 99%   BMI 19.05 kg/m  , BMI Body mass index is 19.05 kg/m. Affect appropriate Healthy:  appears stated age 77: normal Neck supple with no adenopathy JVP normal no bruits no thyromegaly Lungs clear with no wheezing and good diaphragmatic motion Heart:  S1/S2 no murmur, no rub, gallop or click PMI normal Abdomen: benighn, BS  positve, no tenderness, no AAA no bruit.  No HSM or HJR Distal pulses intact with no bruits No edema Neuro non-focal Skin warm and dry No muscular weakness    EKG:  SR rate 96 PAC nonspecific ST changes    Recent Labs: No results found for requested labs within last 8760 hours.    Lipid Panel    Component Value Date/Time   CHOL 250 (H) 11/11/2014 0912   TRIG 55.0 11/11/2014 0912   HDL 133.20 11/11/2014 0912   CHOLHDL 2 11/11/2014 0912   VLDL 11.0 11/11/2014 0912   LDLCALC 106 (H) 11/11/2014 0912   LDLDIRECT 102.8 04/16/2012 1333      Wt Readings from Last 3 Encounters:  04/12/18 118 lb (53.5 kg)  04/04/18 118 lb 6.4 oz (53.7 kg)  02/27/18 116 lb 6.4 oz (52.8 kg)      Other studies Reviewed: Additional studies/ records that were reviewed today include: Notes from pulmonary labs CXR, chest CT TTE from January and ECG.    ASSESSMENT AND PLAN:  1.  Dyspnea:  This appears to be primarily from lung disease Echo suggest grade 2 diastolic and patient not on diuretic. EF is normal Has atherosclerosis  On CT but no chest pain so will not order myhovue for starters  For Begin t HCTZ 12.5 mg and do BNP and BMET in 3 weeks If she tolerates this will think about repeating TTE in 6 months   2. Emphysema: with increased 7 mm nodule f/u CT 3 months vs PET scan F/u Dr Halford Chessman   3. GERD:  Continue prilosec    Current medicines are reviewed at length with the patient today.  The patient does not have concerns regarding medicines.  The following changes have been made: HCTZ 12.5 mg daily   Labs/ tests ordered today include: BMET and BNP 3 weeks  No orders of the defined types were placed in this encounter.    Disposition:   FU with cardiology my next available      Signed, Jenkins Rouge, MD  04/12/2018 9:53 AM    [address], [place], [place]  [address] Phone: [phone]; Fax: [phone]

## 2018-04-11 ENCOUNTER — Encounter: Payer: Self-pay | Admitting: Cardiovascular Disease

## 2018-04-12 ENCOUNTER — Encounter: Payer: Self-pay | Admitting: Cardiovascular Disease

## 2018-04-12 ENCOUNTER — Ambulatory Visit: Payer: Medicare Other | Admitting: Cardiovascular Disease

## 2018-04-12 VITALS — BP 148/86 | HR 76 | Ht 66.0 in | Wt 118.0 lb

## 2018-04-12 DIAGNOSIS — R0600 Dyspnea, unspecified: Secondary | ICD-10-CM | POA: Diagnosis not present

## 2018-04-12 MED ORDER — HYDROCHLOROTHIAZIDE 12.5 MG PO CAPS
12.5000 mg | ORAL_CAPSULE | Freq: Every day | ORAL | 3 refills | Status: DC
Start: 2018-04-12 — End: 2018-04-30

## 2018-04-12 NOTE — Patient Instructions (Signed)
Medication Instructions:  Your physician has recommended you make the following change in your medication:  1-START hydrochlorothiazide 12.5 mg by mouth daily.  If you need a refill on your cardiac medications before your next appointment, please call your pharmacy.   Lab work: Your physician recommends that you return for lab work in:3 weeks for BMET and BNP  If you have labs (blood work) drawn today and your tests are completely normal, you will receive your results only by: Marland Kitchen MyChart Message (if you have MyChart) OR . A paper copy in the mail If you have any lab test that is abnormal or we need to change your treatment, we will call you to review the results.  Testing/Procedures: None ordered today.  Follow-Up: At [facility], you and your health needs are our priority.  As part of our continuing mission to provide you with exceptional heart care, we have created designated Provider Care Teams.  These Care Teams include your primary Cardiologist (physician) and Advanced Practice Providers (APPs -  Physician Assistants and Nurse Practitioners) who all work together to provide you with the care you need, when you need it. You will need a follow up appointment next available. You may see Dr. Johnsie Cancel or one of the following Advanced Practice Providers on your designated Care Team:   Truitt Merle, NP Cecilie Kicks, NP . Kathyrn Drown, NP

## 2018-04-26 ENCOUNTER — Telehealth: Payer: Self-pay | Admitting: Pulmonary Disease

## 2018-04-26 DIAGNOSIS — R0609 Other forms of dyspnea: Principal | ICD-10-CM

## 2018-04-26 DIAGNOSIS — G4733 Obstructive sleep apnea (adult) (pediatric): Secondary | ICD-10-CM

## 2018-04-26 DIAGNOSIS — R0600 Dyspnea, unspecified: Secondary | ICD-10-CM

## 2018-04-26 NOTE — Telephone Encounter (Signed)
ONO with RA 03/13/18 >> test time 9 hrs 45 min.  Baseline SpO2 88%, low SpO2 81%.  Spent 5 hrs 57 min with SpO2 < 88%.   Please let her know that her ONO shows low oxygen level at night.  She needs to be started on 2 liters oxygen at night while asleep.    If she is okay with this, then please send order to get nocturnal oxygen set up.  Otherwise schedule ROV with me to discuss in more detail.

## 2018-04-27 NOTE — Telephone Encounter (Signed)
Attempted to call pt x2 but each time the call was connected, the call was immediately disconnected and was unable to leave a VM. Will try to call back later.

## 2018-04-30 ENCOUNTER — Other Ambulatory Visit: Payer: Self-pay | Admitting: Pulmonary Disease

## 2018-04-30 MED ORDER — FUROSEMIDE 20 MG PO TABS: 10.0000 mg | ORAL_TABLET | Freq: Every day | ORAL | 3 refills | Status: DC

## 2018-04-30 NOTE — Telephone Encounter (Signed)
Called patient to let her know about medication changes and changed her lab appointment later due to her just starting lasix. Patient verbalized understanding.

## 2018-04-30 NOTE — Telephone Encounter (Signed)
Dr. Halford Chessman, please advise if it is okay to refill med for pt. Thanks!

## 2018-05-01 NOTE — Telephone Encounter (Signed)
Called and spoke with patient regarding results.  Informed the patient of results and recommendations today. Placed O2 order today for nocturnal oxygen set up Pt verbalized understanding and denied any questions or concerns at this time.  Nothing further needed.

## 2018-05-01 NOTE — Progress Notes (Deleted)
Cardiology Office Note   Date:  05/01/2018   ID:  Brandy Russell, DOB Feb 28, 1956, MRN [id number]  PCP:  Haywood Pao, MD  Cardiologist:   Jenkins Rouge, MD   No chief complaint on file.     History of Present Illness: Brandy Russell is a 63 y.o. female who presents for consultation regarding dyspnea and chronic diastolic CHF.  She sees pulmonary for emphysema and pulmonary nodules Because of progressive fatigue and dyspnea Dr Halford Chessman wanted her seen by cardiology   Echo reviewed from 03/30/18 EF 13-08% grade 2 diastolic dysfunction  No significant valve disease RV normal no signs of pulmonary HTN  She has no chest pain Fatigue and dyspnea worse. She does not appear to be volume overloaded Stopped smoking 2004  Started on oxygen at night by Dr Halford Chessman for low sats  Given grade 2 diastolic tried to start her on HCTZ but didn't tolerate and now on 10 mg Lasix and to  Have f/u BMET and BNP   ***   Past Medical History:  Diagnosis Date  . Allergy   . Anxiety and depression   . CAD (coronary artery disease)    incidental finding on CT scan for lung ca screening  . Constipation   . Depression   . Emphysema   . Emphysema of lung ([place])   . GERD (gastroesophageal reflux disease)   . Herpes    face, uses suppresive therapy intermittently  . Seasonal allergies     Past Surgical History:  Procedure Laterality Date  . COLONOSCOPY    . DILATION AND CURETTAGE OF UTERUS    . POLYPECTOMY    . TONSILLECTOMY AND ADENOIDECTOMY  1962     Current Outpatient Medications  Medication Sig Dispense Refill  . acyclovir (ZOVIRAX) 400 MG tablet Take 1 tablet (400 mg total) by mouth 5 (five) times daily. 60 tablet 3  . albuterol (PROAIR HFA) 108 (90 Base) MCG/ACT inhaler Inhale 2 puffs into the lungs every 6 (six) hours as needed for wheezing or shortness of breath. 3 Inhaler 3  . ALPRAZolam (XANAX) 0.25 MG tablet Take 1 tablet (0.25 mg total) by mouth 3 (three) times daily as needed. 90  tablet 5  . bisacodyl (DULCOLAX) 5 MG EC tablet Take 5 mg by mouth daily as needed for moderate constipation. Use as directed per colonoscopy instructions    . buPROPion (WELLBUTRIN SR) 150 MG 12 hr tablet TAKE 1 TABLET BY MOUTH TWICE DAILY 180 tablet 4  . diclofenac sodium (VOLTAREN) 1 % GEL Apply 1 application topically daily as needed.  2  . doxycycline (VIBRA-TABS) 100 MG tablet Take 1 tablet (100 mg total) by mouth 2 (two) times daily. 14 tablet 0  . Fluticasone-Umeclidin-Vilant (TRELEGY ELLIPTA) 100-62.5-25 MCG/INH AEPB Inhale 1 puff into the lungs daily at 6 (six) AM. 60 each 5  . furosemide (LASIX) 20 MG tablet Take 0.5 tablets (10 mg total) by mouth daily. 45 tablet 3  . ibuprofen (ADVIL,MOTRIN) 200 MG tablet Take 200 mg by mouth every 6 (six) hours as needed.    Marland Kitchen omeprazole (PRILOSEC) 20 MG capsule Take 20 mg by mouth 2 (two) times daily before a meal.     . pravastatin (PRAVACHOL) 20 MG tablet Take 1 tablet by mouth daily.  1   No current facility-administered medications for this visit.     Allergies:   Levaquin [levofloxacin]; Penicillins; and Sulfa antibiotics    Social History:  The patient  reports that she  quit smoking about 12 years ago. Her smoking use included cigarettes. She has a 70.00 pack-year smoking history. She has never used smokeless tobacco. She reports that she does not drink alcohol or use drugs.   Family History:  The patient's family history includes Alcohol abuse in her father and mother; Emphysema in her father and mother; Hypertension in her father; Lung cancer in her father; Melanoma in her brother; Uterine cancer in her mother.    ROS:  Please see the history of present illness.   Otherwise, review of systems are positive for none.   All other systems are reviewed and negative.    PHYSICAL EXAM: VS:  There were no vitals taken for this visit. , BMI There is no height or weight on file to calculate BMI. Affect appropriate Healthy:  appears stated  age 63: normal Neck supple with no adenopathy JVP normal no bruits no thyromegaly Lungs clear with no wheezing and good diaphragmatic motion Heart:  S1/S2 no murmur, no rub, gallop or click PMI normal Abdomen: benighn, BS positve, no tenderness, no AAA no bruit.  No HSM or HJR Distal pulses intact with no bruits No edema Neuro non-focal Skin warm and dry No muscular weakness    EKG:  SR rate 96 PAC nonspecific ST changes    Recent Labs: No results found for requested labs within last 8760 hours.    Lipid Panel    Component Value Date/Time   CHOL 250 (H) 11/11/2014 0912   TRIG 55.0 11/11/2014 0912   HDL 133.20 11/11/2014 0912   CHOLHDL 2 11/11/2014 0912   VLDL 11.0 11/11/2014 0912   LDLCALC 106 (H) 11/11/2014 0912   LDLDIRECT 102.8 04/16/2012 1333      Wt Readings from Last 3 Encounters:  04/12/18 118 lb (53.5 kg)  04/04/18 118 lb 6.4 oz (53.7 kg)  02/27/18 116 lb 6.4 oz (52.8 kg)      Other studies Reviewed: Additional studies/ records that were reviewed today include: Notes from pulmonary labs CXR, chest CT TTE from January and ECG.    ASSESSMENT AND PLAN:  1.  Dyspnea:  This appears to be primarily from lung disease Echo suggest grade 2 diastolic EF is normal Has atherosclerosis  On CT but no chest pain so will not order myhovue for starters  Did not tolerate HCTZ and started On low dose lasix *** ONO with low sats and started on nightly oxygen by Dr Halford Chessman She will have f/u BMET and BNP After being on lasix for a few weeks   2. Emphysema: with increased 7 mm nodule f/u CT 3 months vs PET scan F/u Dr Halford Chessman Likely etiology of dyspnea   3. GERD:  Continue prilosec    Current medicines are reviewed at length with the patient today.  The patient does not have concerns regarding medicines.  The following changes have been made:   Labs/ tests ordered today include:    No orders of the defined types were placed in this encounter.    Disposition:   FU  with cardiology 6 months     Signed, Jenkins Rouge, MD  05/01/2018 5:32 PM    [facility] [address], [place], [place]  [address] Phone: [phone]; Fax: [phone]

## 2018-05-02 ENCOUNTER — Telehealth: Payer: Self-pay | Admitting: Pulmonary Disease

## 2018-05-02 ENCOUNTER — Other Ambulatory Visit: Payer: Medicare Other

## 2018-05-02 DIAGNOSIS — R0609 Other forms of dyspnea: Principal | ICD-10-CM

## 2018-05-02 DIAGNOSIS — J439 Emphysema, unspecified: Secondary | ICD-10-CM

## 2018-05-02 DIAGNOSIS — R0602 Shortness of breath: Secondary | ICD-10-CM

## 2018-05-02 DIAGNOSIS — R0600 Dyspnea, unspecified: Secondary | ICD-10-CM

## 2018-05-02 NOTE — Telephone Encounter (Signed)
Great.  Please let patient know and arrange to repeat ONO with RA.

## 2018-05-02 NOTE — Telephone Encounter (Signed)
lmtcb

## 2018-05-02 NOTE — Telephone Encounter (Signed)
Call made to [facility] with [facility], they need a different DX code for the nocturnal oxygen. Order sent with SOB and Pulmonary Emphysema. Melissa aware. Nothing further is needed at this time.

## 2018-05-02 NOTE — Telephone Encounter (Signed)
Spoke with Angelia at [facility].  She stated the ONO has to be within 30 days and pt's ONO was 03/13/18.  Dr Halford Chessman please advise.

## 2018-05-02 NOTE — Telephone Encounter (Signed)
Called and spoke with Patient. Explained that [facility] was requesting a repeat ONO, because the previous was not within 30 days.  Understanding stated.  ONO on RA order placed. Nothing further at this time.

## 2018-05-03 NOTE — Telephone Encounter (Signed)
Called and spoke to pt.  Pt is requesting that order for oxygen be sent to [place].  Per our records order was placed to [facility] on 05/02/18. Order has been placed to [place] per pt request.   PCC's can you guys cancel the order for [facility]. Thanks

## 2018-05-03 NOTE — Telephone Encounter (Signed)
Done

## 2018-05-04 ENCOUNTER — Telehealth: Payer: Self-pay | Admitting: Pulmonary Disease

## 2018-05-04 DIAGNOSIS — R0602 Shortness of breath: Secondary | ICD-10-CM

## 2018-05-04 DIAGNOSIS — J439 Emphysema, unspecified: Secondary | ICD-10-CM

## 2018-05-04 NOTE — Telephone Encounter (Signed)
Called [name] No Name. Brandy Russell stated that Patient does not have qualifying oxygen sats and is not active with them.   Spoke with Vida Roller, CMA, she said that she was working on it. Brandy Russell is aware that it is being worked on at this time, and someone will be in touch.  Message routed to [place], [place]

## 2018-05-07 NOTE — Telephone Encounter (Signed)
     05/07/18 4:12 PM  I was told this LAST WEEK. Am I supposed to guess when and where? I know nothing about the growth in my lungs, my heart problem, my kidney problem or MY OXYGEN needs. Antony Blackbird, Rosezetta Schlatter  to Chesley Mires, MD        05/07/18 4:01 PM  So, Am I supposed to guess how and when to do it?  Screw it.   Me  to [place], [place]        05/07/18 3:05 PM  Hello Mrs/Ms Panek,  Yes the over night oxygen test has to be done within 30 days that we order oxygen. So this is why we would need to repeat the testing. Im sorry for this inconvenience.    Last read by Clement Husbands at [place] PM on 05/07/2018.  Clement Husbands  to Chesley Mires, MD        05/07/18 12:42 PM  Is it because they need a test within a month?  Antony Blackbird     Dr. Halford Chessman send you this to keep you informed on this situation.

## 2018-05-07 NOTE — Telephone Encounter (Signed)
Called patient back today to go over what is needed at this point with ONO. Scheduled appt with VS 05/08/2018 at 945am Pt will need ov with VS, then ONO order placed with [facility]. Nothing further needed at this time.

## 2018-05-07 NOTE — Telephone Encounter (Signed)
Message routed to [facility] for updates

## 2018-05-07 NOTE — Telephone Encounter (Signed)
I have respond to this same question in another message nothing further is needed at this time

## 2018-05-07 NOTE — Telephone Encounter (Signed)
Called and spoke with patient regarding her ONO results ONO was longer getting back from [facility], was resulted last week with VS on 05/03/2018 Pt then switching DME from [facility] to [place] pt that she will need to have another ONO completed as it is over 30 days due to insurance purposes Placed order for ONO today; advised patient on VM that s/o in [facility] will call to have this scheduled Pt was frustrated with my call and recommendations and hung up the phone call LVM in detail for patient that ONO is placed to new DME-[facility] of her choice and if she feels that she needs to speak with VS in more detail about other issues and concerns as recommended in her email today she can schedule and ov with him as soon as possible. X1 Will follow up with patient later in the week.

## 2018-05-08 ENCOUNTER — Encounter: Payer: Self-pay | Admitting: Pulmonary Disease

## 2018-05-08 ENCOUNTER — Telehealth: Payer: Self-pay | Admitting: Pulmonary Disease

## 2018-05-08 ENCOUNTER — Ambulatory Visit (INDEPENDENT_AMBULATORY_CARE_PROVIDER_SITE_OTHER): Payer: Medicare Other | Admitting: Pulmonary Disease

## 2018-05-08 ENCOUNTER — Other Ambulatory Visit: Payer: Medicare Other

## 2018-05-08 ENCOUNTER — Ambulatory Visit: Payer: Medicare Other | Admitting: Cardiovascular Disease

## 2018-05-08 VITALS — BP 118/76 | HR 101 | Ht 66.0 in | Wt 116.0 lb

## 2018-05-08 DIAGNOSIS — R911 Solitary pulmonary nodule: Secondary | ICD-10-CM

## 2018-05-08 DIAGNOSIS — F332 Major depressive disorder, recurrent severe without psychotic features: Secondary | ICD-10-CM | POA: Diagnosis not present

## 2018-05-08 DIAGNOSIS — J432 Centrilobular emphysema: Secondary | ICD-10-CM | POA: Diagnosis not present

## 2018-05-08 NOTE — Telephone Encounter (Signed)
Pt was seen in office today by VS as scheduled Referral was made to physiatric evaluation to access for recurrent depression Advised pt today that s/o from the facility will call her this week to schedule an appt for her [place];  They rec'd the order today, and LVM for pt to return call. Waiting to see when appt is made for patient, per pt's request in office today.

## 2018-05-08 NOTE — Progress Notes (Signed)
[facility]  Chief Complaint  Patient presents with  . Follow-up    Pt here for ov in ONO results. Pt has productive cough-white, SOB with exertion, some wheezing.    Constitutional:  BP 118/76 (BP Location: Left Arm, Cuff Size: Normal)   Pulse (!) 101   Ht 5\' 6"  (1.676 m)   Wt 116 lb (52.6 kg)   SpO2 95%   BMI 18.72 kg/m   Past Medical History:  Anxiety, Depression, CAD, GERD, PNA  Brief Summary:  Brandy Russell is a 63 y.o. female former smoker with COPD and emphysema.  She has been very frustrated about all her health issues and how difficult it has been to get things set up.  She was concerned that she was being heard.  This has caused her to feel more depressed, and when she gets depressed she feels angry.  She was seen by psychiatry before and had a lot of difficulty getting on a good regimen.  She feels she needs to be seen by psychiatry again.  She continues to struggle with keeping her weight up.  She continues to have bloating and irregular bowel habits.  She has adjusted her diet with partial improvement.  She hasn't been able to see her PCP yet to discuss bile duct findings from CT chest in December.  She had overnight oximetry in December that showed oxygen desaturation.  Unfortunately, by the time this was available for it was passed 30 days and her insurance required this to be repeated.    She feels her breathing is worse when poor weather.    Physical Exam:   Appearance - well kempt   ENMT - no sinus tenderness, no nasal discharge, no oral exudate  Neck - no masses, trachea midline, no thyromegaly, no elevation in JVP  Respiratory - normal appearance of chest wall, normal respiratory effort w/o accessory muscle use, no dullness on percussion, no wheezing or rales  CV - s1s2 regular rate and rhythm, no murmurs, no peripheral edema, radial pulses symmetric  GI - soft, non tender  Lymph - no adenopathy noted in neck  and axillary areas  MSK - normal gait  Ext - no cyanosis, clubbing, or joint inflammation noted  Skin - no rashes, lesions, or ulcers  Neuro - normal strength, oriented x 3  Psych - depressed, tearful at times.   Assessment/Plan:   COPD with emphysema. - continue trelegy and prn albuterol - will repeat overnight oximetry on room air to determine if she would still qualify for home oxygen set up  Lung nodules. - she has f/u CT chest schedule for March 2020  Major depression. - will arrange for referral to psychiatry  Anxiety with dynamic hyperinflation. - prn xanax  Coronary atherosclerosis, diastolic CHF. - she has f/u with Cardiology later this month  Bloating with irregular bowel pattern. - she had possible biliary duct dilation on CT imaging from December 2019 - she will f/u with her PCP to assess further - offered referral to GI, but she would like to speak with her PCP first   Patient Instructions  Will arrange for referral to psychiatry  Follow up in 2 months  Time spent 32 minutes  Chesley Mires, MD [place] Pager: [phone] 05/08/2018, 10:07 AM  Flow Sheet     Pulmonary tests:  Spirometry 05/30/12 >> FEV1 0.69 (26%), FEV1% 40 A1AT 05/30/12 >> 112, PI*MS ONO with RA 07/03/12 >>Test time 8 hrs 33 min.  Mean SpO2 92.6%, low SpO2 87%. Spent 8 min with SpO2 <88%. Sputum 07/23/15 >> Pseudomonas aeruginosa ONO with RA 03/13/18 >> test time 9 hrs 45 min.  Baseline SpO2 88%, low SpO2 81%.  Spent 5 hrs 57 min with SpO2 < 88%.  Chest imaging:  CT chest 04/04/09 >> apical centrilobular emphysema, 3 mm RUL nodule, 4 mm RUL nodule Low dose CT chest 10/03/14 >> multiple pulmonary nodules up to 5 mm, mild centrilobular and paraseptal emphysema Low dose CT chest 10/06/15 >> atherosclerosis, mod centrilobular emphysema, bronchial wall thickening, scattered nodules up to 3.6 mm Esophagram 11/25/15 >> no specific abnormality Low dose CT chest  10/06/16 >> no change Low dose CT chest 03/23/18 >> atherosclerosis, multiple nodules with new nodule 7.7 mm RUL, centrilobular and paraseptal emphysema, 3 cm lesion in left lobe of liver, possible dilated bile duct  Cardiac tests:  Echo 02/08/05 >> EF 40 to 50% Echo 03/30/18 >> EF 55 to 60%, grade 2 DD  Medications:   Allergies as of 05/08/2018      Reactions   Levaquin [levofloxacin] Other (See Comments)   tendonitis   Penicillins    Sulfa Antibiotics    GI upset per the pt      Medication List       Accurate as of May 08, 2018 10:07 AM. Always use your most recent med list.        acyclovir 400 MG tablet Commonly known as:  ZOVIRAX Take 1 tablet (400 mg total) by mouth 5 (five) times daily.   albuterol 108 (90 Base) MCG/ACT inhaler Commonly known as:  PROAIR HFA Inhale 2 puffs into the lungs every 6 (six) hours as needed for wheezing or shortness of breath.   ALPRAZolam 0.25 MG tablet Commonly known as:  XANAX TAKE 1 TABLET BY MOUTH THREE TIMES DAILY AS NEEDED   bisacodyl 5 MG EC tablet Commonly known as:  DULCOLAX Take 5 mg by mouth daily as needed for moderate constipation. Use as directed per colonoscopy instructions   buPROPion 150 MG 12 hr tablet Commonly known as:  WELLBUTRIN SR TAKE 1 TABLET BY MOUTH TWICE DAILY   diclofenac sodium 1 % Gel Commonly known as:  VOLTAREN Apply 1 application topically daily as needed.   Fluticasone-Umeclidin-Vilant 100-62.5-25 MCG/INH Aepb Commonly known as:  TRELEGY ELLIPTA Inhale 1 puff into the lungs daily at 6 (six) AM.   furosemide 20 MG tablet Commonly known as:  LASIX Take 0.5 tablets (10 mg total) by mouth daily.   ibuprofen 200 MG tablet Commonly known as:  ADVIL,MOTRIN Take 200 mg by mouth every 6 (six) hours as needed.   omeprazole 20 MG capsule Commonly known as:  PRILOSEC Take 20 mg by mouth 2 (two) times daily before a meal.   pravastatin 20 MG tablet Commonly known as:  PRAVACHOL Take 1 tablet  by mouth daily.       Past Surgical History:  She  has a past surgical history that includes Tonsillectomy and adenoidectomy (1962); Dilation and curettage of uterus; Colonoscopy; and Polypectomy.  Family History:  Her family history includes Alcohol abuse in her father and mother; Emphysema in her father and mother; Hypertension in her father; Lung cancer in her father; Melanoma in her brother; Uterine cancer in her mother.  Social History:  She  reports that she quit smoking about 12 years ago. Her smoking use included cigarettes. She has a 70.00 pack-year smoking history. She has never used smokeless tobacco. She reports that she does  not drink alcohol or use drugs.

## 2018-05-08 NOTE — Patient Instructions (Signed)
Will arrange for referral to psychiatry  Follow up in 2 months

## 2018-05-16 ENCOUNTER — Encounter (HOSPITAL_COMMUNITY): Payer: Self-pay | Admitting: Psychiatry

## 2018-05-16 ENCOUNTER — Ambulatory Visit (INDEPENDENT_AMBULATORY_CARE_PROVIDER_SITE_OTHER): Payer: Medicare Other | Admitting: Psychiatry

## 2018-05-16 VITALS — BP 128/82 | HR 82 | Ht 66.0 in | Wt 119.0 lb

## 2018-05-16 DIAGNOSIS — F410 Panic disorder [episodic paroxysmal anxiety] without agoraphobia: Secondary | ICD-10-CM | POA: Diagnosis not present

## 2018-05-16 DIAGNOSIS — F331 Major depressive disorder, recurrent, moderate: Secondary | ICD-10-CM | POA: Diagnosis not present

## 2018-05-16 MED ORDER — BUPROPION HCL ER (SR) 200 MG PO TB12: 150.0000 mg | ORAL_TABLET | Freq: Two times a day (BID) | ORAL | 0 refills | Status: DC

## 2018-05-16 NOTE — Progress Notes (Deleted)
Cardiology Office Note   Date:  05/16/2018   ID:  Brandy Russell, DOB Jun 28, 1955, MRN [id number]  PCP:  Haywood Pao, MD  Cardiologist:   Jenkins Rouge, MD   No chief complaint on file.     History of Present Illness: Brandy Russell is a 63 y.o. female who is seen in f/u regarding dyspnea and chronic diastolic CHF First seen by me 04/12/18.  She sees pulmonary for emphysema and pulmonary nodules Because of progressive fatigue and dyspnea Dr Halford Chessman wanted her seen by cardiology   Echo reviewed from 03/30/18 EF 03-50% grade 2 diastolic dysfunction  No significant valve disease RV normal no signs of pulmonary HTN  She has no chest pain Fatigue and dyspnea worse. She does not appear to be volume overloaded Stopped smoking 2004  Started on oxygen at night by Dr Halford Chessman for low sats  Given grade 2 diastolic tried to start her on HCTZ but didn't tolerate and now on 10 mg Lasix  Was To have f/u BMET and BNP as well as myovue given coronary calcium seen on CT of chest for lungs   Referred to psychiatry by Dr Halford Chessman for anxiety and depression    ***   Past Medical History:  Diagnosis Date  . Allergy   . Anxiety and depression   . CAD (coronary artery disease)    incidental finding on CT scan for lung ca screening  . Constipation   . Depression   . Emphysema   . Emphysema of lung ([place])   . GERD (gastroesophageal reflux disease)   . Herpes    face, uses suppresive therapy intermittently  . Seasonal allergies     Past Surgical History:  Procedure Laterality Date  . COLONOSCOPY    . DILATION AND CURETTAGE OF UTERUS    . POLYPECTOMY    . TONSILLECTOMY AND ADENOIDECTOMY  1962     Current Outpatient Medications  Medication Sig Dispense Refill  . acyclovir (ZOVIRAX) 400 MG tablet Take 1 tablet (400 mg total) by mouth 5 (five) times daily. 60 tablet 3  . albuterol (PROAIR HFA) 108 (90 Base) MCG/ACT inhaler Inhale 2 puffs into the lungs every 6 (six) hours as needed for  wheezing or shortness of breath. 3 Inhaler 3  . ALPRAZolam (XANAX) 0.25 MG tablet TAKE 1 TABLET BY MOUTH THREE TIMES DAILY AS NEEDED 90 tablet 3  . bisacodyl (DULCOLAX) 5 MG EC tablet Take 5 mg by mouth daily as needed for moderate constipation. Use as directed per colonoscopy instructions    . buPROPion (WELLBUTRIN SR) 200 MG 12 hr tablet Take 1 tablet (200 mg total) by mouth 2 (two) times daily. 180 tablet 0  . diclofenac sodium (VOLTAREN) 1 % GEL Apply 1 application topically daily as needed.  2  . Fluticasone-Umeclidin-Vilant (TRELEGY ELLIPTA) 100-62.5-25 MCG/INH AEPB Inhale 1 puff into the lungs daily at 6 (six) AM. 60 each 5  . furosemide (LASIX) 20 MG tablet Take 0.5 tablets (10 mg total) by mouth daily. 45 tablet 3  . ibuprofen (ADVIL,MOTRIN) 200 MG tablet Take 200 mg by mouth every 6 (six) hours as needed.    Marland Kitchen omeprazole (PRILOSEC) 20 MG capsule Take 20 mg by mouth 2 (two) times daily before a meal.     . pravastatin (PRAVACHOL) 20 MG tablet Take 1 tablet by mouth daily.  1   No current facility-administered medications for this visit.     Allergies:   Levaquin [levofloxacin]; Penicillins; and Sulfa antibiotics  Social History:  The patient  reports that she quit smoking about 12 years ago. Her smoking use included cigarettes. She has a 70.00 pack-year smoking history. She has never used smokeless tobacco. She reports that she does not drink alcohol or use drugs.   Family History:  The patient's family history includes Alcohol abuse in her father and mother; Emphysema in her father and mother; Hypertension in her father; Lung cancer in her father; Melanoma in her brother; Uterine cancer in her mother.    ROS:  Please see the history of present illness.   Otherwise, review of systems are positive for none.   All other systems are reviewed and negative.    PHYSICAL EXAM: VS:  There were no vitals taken for this visit. , BMI There is no height or weight on file to calculate  BMI. Affect appropriate Overweight white female  HEENT: normal Neck supple with no adenopathy JVP normal no bruits no thyromegaly Lungs clear with no wheezing and good diaphragmatic motion Heart:  S1/S2 no murmur, no rub, gallop or click PMI normal Abdomen: benighn, BS positve, no tenderness, no AAA no bruit.  No HSM or HJR Distal pulses intact with no bruits No edema Neuro non-focal Skin warm and dry No muscular weakness    EKG:  SR rate 96 PAC nonspecific ST changes    Recent Labs: No results found for requested labs within last 8760 hours.    Lipid Panel    Component Value Date/Time   CHOL 250 (H) 11/11/2014 0912   TRIG 55.0 11/11/2014 0912   HDL 133.20 11/11/2014 0912   CHOLHDL 2 11/11/2014 0912   VLDL 11.0 11/11/2014 0912   LDLCALC 106 (H) 11/11/2014 0912   LDLDIRECT 102.8 04/16/2012 1333      Wt Readings from Last 3 Encounters:  05/08/18 52.6 kg  04/12/18 53.5 kg  04/04/18 53.7 kg      Other studies Reviewed: Additional studies/ records that were reviewed today include: Notes from pulmonary labs CXR, chest CT TTE from January and ECG.    ASSESSMENT AND PLAN:  1.  Dyspnea:  This appears to be primarily from lung disease Echo suggest grade 2 diastolic EF is normal Has atherosclerosis  On CT but no chest pain needs ischemic evaluation with myovue Now on lasix for her diastolic Dysfunction ***  2. Emphysema: with increased 7 mm nodule f/u CT 3 months vs PET scan F/u Dr Halford Chessman Likely etiology of dyspnea   3. GERD:  Continue prilosec   4. Anxiety/Depression :  Seen by psychiatry 05/16/18 On welbutrin and xanax    Current medicines are reviewed at length with the patient today.  The patient does not have concerns regarding medicines.  The following changes have been made:   Labs/ tests ordered today include:   Myovue BMET and BNP  No orders of the defined types were placed in this encounter.    Disposition:   FU with cardiology in a year      Signed, Jenkins Rouge, MD  05/16/2018 6:56 PM    [address], [place], [place]  [address] Phone: [phone]; Fax: [phone]

## 2018-05-16 NOTE — Progress Notes (Addendum)
Psychiatric Initial Adult Assessment   Patient Identification: Brandy Russell MRN:  [id number] Date of Evaluation:  05/16/2018 Referral Source: Chesley Mires MD Chief Complaint:   depression Visit Diagnosis:    ICD-10-CM   1. Major depressive disorder, recurrent episode, moderate (HCC) F33.1   2. Panic disorder F41.0     History of Present Illness:  63 yo divorced female with long hx of major depression and anxiety. She admits getting more irritable, easily frustrated when her depression gets worse. She also admits being worried about her medical problems: COPD, GI sx such as bloating inability to gain weight. He sleep and appetite are adequate, she denies feeling hopeless or suicidal, there is no problem with concentration. She does feel easily tired and lacks motivation but wonders if this has more to do with having severe COPD (she is not oxygen dependent).  Patient has a hx of severe episodes of depression leading to suicide attempts twice by OD and inpatient admissions. These occurred in [facility] after death of her brother from melanoma. She has been on and off in counseling and has tried in the past three antidepressants (remembers fluoxetine and sertraline) which were not helpful or well tolerated. For the past 17 years she has been taking bupropion which she finds still helpful. She appears to have never tried augmentation strategies. Patient does not have a hx of mania, psychosis. She used to abuse alcohol since her teenage years (both parents had alcohol use disorder) and was in rehab twice. She had one DUI and no hx of withdrawal seizures or DTs. She has been fully sober for the past 9 years. No druug abuse; she quit smoking years ago.  Associated Signs/Symptoms: Depression Symptoms:  depressed mood, low energy, loss of interest (Hypo) Manic Symptoms:  Irritable Mood, Anxiety Symptoms:  Panic Symptoms, Psychotic Symptoms:  none PTSD Symptoms: Negative  Past Psychiatric History:  see above  Previous Psychotropic Medications: Yes   Substance Abuse History in the last 12 months:  No.  Consequences of Substance Abuse: Negative  Past Medical History:  Past Medical History:  Diagnosis Date  . Allergy   . Anxiety and depression   . CAD (coronary artery disease)    incidental finding on CT scan for lung ca screening  . Constipation   . Depression   . Emphysema   . Emphysema of lung ([place])   . GERD (gastroesophageal reflux disease)   . Herpes    face, uses suppresive therapy intermittently  . Seasonal allergies     Past Surgical History:  Procedure Laterality Date  . COLONOSCOPY    . DILATION AND CURETTAGE OF UTERUS    . POLYPECTOMY    . TONSILLECTOMY AND ADENOIDECTOMY  1962    Family Psychiatric History: reviewed  Family History:  Family History  Problem Relation Age of Onset  . Alcohol abuse Mother   . Uterine cancer Mother   . Emphysema Mother   . Alcohol abuse Father   . Lung cancer Father   . Hypertension Father   . Emphysema Father   . Melanoma Brother   . Colon cancer Neg Hx   . Esophageal cancer Neg Hx   . Rectal cancer Neg Hx   . Stomach cancer Neg Hx   . Colon polyps Neg Hx     Social History:   Social History   Socioeconomic History  . Marital status: Divorced    Spouse name: Not on file  . Number of children: 0  . Years  of education: Not on file  . Highest education level: Bachelor's degree (e.g., BA, AB, BS)  Occupational History  . Occupation: artists  Social Needs  . Financial resource strain: Not on file  . Food insecurity:    Worry: Not on file    Inability: Not on file  . Transportation needs:    Medical: Not on file    Non-medical: Not on file  Tobacco Use  . Smoking status: Former Smoker    Packs/day: 2.00    Years: 35.00    Pack years: 70.00    Types: Cigarettes    Last attempt to quit: 03/28/2006    Years since quitting: 12.1  . Smokeless tobacco: Never Used  Substance and Sexual Activity  . Alcohol  use: No  . Drug use: No  . Sexual activity: Never  Lifestyle  . Physical activity:    Days per week: Not on file    Minutes per session: Not on file  . Stress: Not on file  Relationships  . Social connections:    Talks on phone: Not on file    Gets together: Not on file    Attends religious service: Not on file    Active member of club or organization: Not on file    Attends meetings of clubs or organizations: Not on file    Relationship status: Not on file  Other Topics Concern  . 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  Allergies:   Allergies  Allergen Reactions  . Levaquin [Levofloxacin] Other (See Comments)    tendonitis  . Penicillins   . Sulfa Antibiotics     GI upset per the pt    Metabolic Disorder Labs: Lab Results  Component Value Date   HGBA1C 5.5 09/19/2013   No results found for: PROLACTIN Lab Results  Component Value Date   CHOL 250 (H) 11/11/2014   TRIG 55.0 11/11/2014   HDL 133.20 11/11/2014   CHOLHDL 2 11/11/2014   VLDL 11.0 11/11/2014   LDLCALC 106 (H) 11/11/2014   LDLCALC 127 (H) 09/19/2013   Lab Results  Component Value Date   TSH 1.13 09/19/2013    Therapeutic Level Labs: No results found for: LITHIUM No results found for: CBMZ No results found for: VALPROATE  Current Medications: Current Outpatient Medications  Medication Sig Dispense Refill  . acyclovir (ZOVIRAX) 400 MG tablet Take 1 tablet (400 mg total) by mouth 5 (five) times daily. 60 tablet 3  . albuterol (PROAIR HFA) 108 (90 Base) MCG/ACT inhaler Inhale 2 puffs into the lungs every 6 (six) hours as needed for wheezing or  shortness of breath. 3 Inhaler 3  . ALPRAZolam (XANAX) 0.25 MG tablet TAKE 1 TABLET BY MOUTH THREE TIMES DAILY AS NEEDED 90 tablet 3  . buPROPion (WELLBUTRIN SR) 200 MG 12 hr tablet Take 1 tablet (200 mg total) by mouth 2 (two) times daily. 180 tablet 0  . diclofenac sodium (VOLTAREN) 1 % GEL Apply 1 application topically daily as needed.  2  . Fluticasone-Umeclidin-Vilant (TRELEGY ELLIPTA) 100-62.5-25  MCG/INH AEPB Inhale 1 puff into the lungs daily at 6 (six) AM. 60 each 5  . furosemide (LASIX) 20 MG tablet Take 0.5 tablets (10 mg total) by mouth daily. 45 tablet 3  . ibuprofen (ADVIL,MOTRIN) 200 MG tablet Take 200 mg by mouth every 6 (six) hours as needed.    Marland Kitchen omeprazole (PRILOSEC) 20 MG capsule Take 20 mg by mouth 2 (two) times daily before a meal.     . pravastatin (PRAVACHOL) 20 MG tablet Take 1 tablet by mouth daily.  1  . bisacodyl (DULCOLAX) 5 MG EC tablet Take 5 mg by mouth daily as needed for moderate constipation. Use as directed per colonoscopy instructions     No current facility-administered medications for this visit.     Musculoskeletal: Strength & Muscle Tone: within normal limits Gait & Station: normal Patient leans: N/A  Psychiatric Specialty Exam: Review of Systems  Constitutional: Positive for malaise/fatigue.  HENT: Negative.   Eyes: Negative.   Respiratory: Positive for shortness of breath.   Cardiovascular: Negative.   Gastrointestinal: Positive for constipation and heartburn.  Genitourinary: Negative.   Musculoskeletal: Negative.   Skin: Negative.   Neurological: Negative.   Endo/Heme/Allergies: Negative.   Psychiatric/Behavioral: Positive for depression. The patient is nervous/anxious.     Blood pressure 128/82, pulse 82, height 5\' 6"  (1.676 m), weight 119 lb (54 kg), SpO2 98 %.Body mass index is 19.21 kg/m.  General Appearance: Casual and Well Groomed  Eye Contact:  Good  Speech:  Clear and Coherent  Volume:  Normal  Mood:  Depressed  Affect:   Non-Congruent and Full Range  Thought Process:  Goal Directed  Orientation:  Full (Time, Place, and Person)  Thought Content:  Logical  Suicidal Thoughts:  No  Homicidal Thoughts:  No  Memory:  Immediate;   Good Recent;   Good Remote;   Good  Judgement:  Fair  Insight:  Good  Psychomotor Activity:  Normal  Concentration:  Concentration: Good  Recall:  Good  Fund of Knowledge:Good  Language: Good  Akathisia:  Negative  Handed:  Right  AIMS (if indicated):  not done  Assets:  Communication Skills Desire for Improvement Housing Resilience Vocational/Educational  ADL's:  Intact  Cognition: WNL  Sleep:  Good   Screenings: PHQ2-9     PULMONARY REHAB COPD ORIENTATION from 07/23/2012 in [place]  PHQ-2 Total Score  3  PHQ-9 Total Score  12      Assessment and Plan: 63 yo divorced female with long hx of  Depression (dx with MDD in 1971) and occasional panic attacks who comes to establish regular psychiatric care and review appropriateness of her medications. Her symptom intensity  suggests moderate level of depression. She has had a good response to bupropion which she has been on for 17 years. Poor prior response to SSRIs. She is prescribed alprazolam at a low dose as needed for panic anxiety but takes it very sparingly. There is no SI/HI, no psychotic sx. She is in sustained remission of alcohol problem drinking. Patient has  Advanced COPD which may be contributing to some of ner sx (fatugue, low motivation).  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 We also discussed an option of adding low dose  Aripiprazole for augmentation next. The plan was discussed with patient. I spend 60  minutes in direct face to face clinical contact with the patient and devoted approximately 50% of  this time to explanation of diagnosis, discussion of treatment options and med education. Return to clinic in 4 weeks.   Stephanie Acre, MD 2/19/202011:36 AM

## 2018-05-24 ENCOUNTER — Ambulatory Visit: Payer: Medicare Other | Admitting: Cardiovascular Disease

## 2018-05-24 ENCOUNTER — Telehealth: Payer: Self-pay | Admitting: Pulmonary Disease

## 2018-05-24 DIAGNOSIS — J439 Emphysema, unspecified: Principal | ICD-10-CM | POA: Insufficient documentation

## 2018-05-24 DIAGNOSIS — G4736 Sleep related hypoventilation in conditions classified elsewhere: Secondary | ICD-10-CM

## 2018-05-24 NOTE — Progress Notes (Signed)
Cardiology Office Note   Date:  05/25/2018   ID:  Brandy Russell, Brandy Russell Brandy Russell, MRN [id number]  PCP:  Haywood Pao, MD  Cardiologist:   Jenkins Rouge, MD   No chief complaint on file.     History of Present Illness: Brandy Russell is a 63 y.o. female who is seen in f/u regarding dyspnea and chronic diastolic CHF First seen by me 04/12/18.  She sees pulmonary for emphysema and pulmonary nodules Because of progressive fatigue and dyspnea Dr Halford Chessman wanted her seen by cardiology   Echo reviewed from 03/30/18 EF 74-08% grade 2 diastolic dysfunction  No significant valve disease RV normal no signs of pulmonary HTN  She has no chest pain Fatigue and dyspnea worse. She does not appear to be volume overloaded Stopped smoking 2004  Started on oxygen at night by Dr Halford Chessman for low sats  Given grade 2 diastolic tried to start her on HCTZ but didn't tolerate and now on 10 mg Lasix  Was To have f/u BMET and BNP as well as myovue given coronary calcium seen on CT of chest for lungs   Referred to psychiatry by Dr Halford Chessman for anxiety and depression Better on higher dose  Wellbutrin   Does not feel addition of lasix had changed her dyspnea    Past Medical History:  Diagnosis Date  . Allergy   . Anxiety and depression   . CAD (coronary artery disease)    incidental finding on CT scan for lung ca screening  . Constipation   . Depression   . Emphysema   . Emphysema of lung ([place])   . GERD (gastroesophageal reflux disease)   . Herpes    face, uses suppresive therapy intermittently  . Seasonal allergies     Past Surgical History:  Procedure Laterality Date  . COLONOSCOPY    . DILATION AND CURETTAGE OF UTERUS    . POLYPECTOMY    . TONSILLECTOMY AND ADENOIDECTOMY  1962     Current Outpatient Medications  Medication Sig Dispense Refill  . acyclovir (ZOVIRAX) 400 MG tablet Take 1 tablet (400 mg total) by mouth 5 (five) times daily. 60 tablet 3  . albuterol (PROAIR HFA) 108 (90 Base)  MCG/ACT inhaler Inhale 2 puffs into the lungs every 6 (six) hours as needed for wheezing or shortness of breath. 3 Inhaler 3  . ALPRAZolam (XANAX) 0.25 MG tablet TAKE 1 TABLET BY MOUTH THREE TIMES DAILY AS NEEDED 90 tablet 3  . bisacodyl (DULCOLAX) 5 MG EC tablet Take 5 mg by mouth daily as needed for moderate constipation. Use as directed per colonoscopy instructions    . buPROPion (WELLBUTRIN SR) 200 MG 12 hr tablet Take 1 tablet (200 mg total) by mouth 2 (two) times daily. 180 tablet 0  . diclofenac sodium (VOLTAREN) 1 % GEL Apply 1 application topically daily as needed.  2  . Fluticasone-Umeclidin-Vilant (TRELEGY ELLIPTA) 100-62.5-25 MCG/INH AEPB Inhale 1 puff into the lungs daily at 6 (six) AM. 60 each 5  . furosemide (LASIX) 20 MG tablet Take 0.5 tablets (10 mg total) by mouth daily. 45 tablet 3  . ibuprofen (ADVIL,MOTRIN) 200 MG tablet Take 200 mg by mouth every 6 (six) hours as needed.    Marland Kitchen omeprazole (PRILOSEC) 20 MG capsule Take 20 mg by mouth 2 (two) times daily before a meal.     . pravastatin (PRAVACHOL) 20 MG tablet Take 1 tablet by mouth daily.  1   No current facility-administered medications for  this visit.     Allergies:   Levaquin [levofloxacin]; Penicillins; and Sulfa antibiotics    Social History:  The patient  reports that she quit smoking about 12 years ago. Her smoking use included cigarettes. She has a 70.00 pack-year smoking history. She has never used smokeless tobacco. She reports that she does not drink alcohol or use drugs.   Family History:  The patient's family history includes Alcohol abuse in her father and mother; Emphysema in her father and mother; Hypertension in her father; Lung cancer in her father; Melanoma in her brother; Uterine cancer in her mother.    ROS:  Please see the history of present illness.   Otherwise, review of systems are positive for none.   All other systems are reviewed and negative.    PHYSICAL EXAM: VS:  BP 112/70   Pulse 73    Ht 5\' 6"  (1.676 m)   Wt 54.8 kg   SpO2 98%   BMI 19.50 kg/m  , BMI Body mass index is 19.5 kg/m. Affect appropriate Overweight white female  HEENT: normal Neck supple with no adenopathy JVP normal no bruits no thyromegaly Lungs clear with no wheezing and good diaphragmatic motion Heart:  S1/S2 no murmur, no rub, gallop or click PMI normal Abdomen: benighn, BS positve, no tenderness, no AAA no bruit.  No HSM or HJR Distal pulses intact with no bruits No edema Neuro non-focal Skin warm and dry No muscular weakness    EKG:  SR rate 96 PAC nonspecific ST changes    Recent Labs: No results found for requested labs within last 8760 hours.    Lipid Panel    Component Value Date/Time   CHOL 250 (H) 11/11/2014 0912   TRIG 55.0 11/11/2014 0912   HDL 133.20 11/11/2014 0912   CHOLHDL 2 11/11/2014 0912   VLDL 11.0 11/11/2014 0912   LDLCALC 106 (H) 11/11/2014 0912   LDLDIRECT 102.8 04/16/2012 1333      Wt Readings from Last 3 Encounters:  05/25/18 54.8 kg  05/08/18 52.6 kg  04/12/18 53.5 kg      Other studies Reviewed: Additional studies/ records that were reviewed today include: Notes from pulmonary labs CXR, chest CT TTE from January and ECG.    ASSESSMENT AND PLAN:  1.  Dyspnea:  This appears to be primarily from lung disease Echo suggest grade 2 diastolic EF is normal Has atherosclerosis  On CT but no chest pain needs ischemic evaluation with myovue Now on lasix for her diastolic Dysfunction BMET /BNP today f/u Myovue r/o CAD   2. Emphysema: with increased 7 mm nodule f/u CT 3 months vs PET scan F/u Dr Halford Chessman Likely etiology of dyspnea   3. GERD:  Continue prilosec   4. Anxiety/Depression :  Seen by psychiatry 05/16/18 On welbutrin and xanax    Current medicines are reviewed at length with the patient today.  The patient does not have concerns regarding medicines.  The following changes have been made:   Labs/ tests ordered today include:   Myovue BMET and  BNP  No orders of the defined types were placed in this encounter.    Disposition:   FU with cardiology in a year     Signed, Jenkins Rouge, MD  05/25/2018 10:08 AM    [address], [place], [place]  [address] Phone: [phone]; Fax: [phone]

## 2018-05-24 NOTE — Telephone Encounter (Signed)
ONO with RA 05/09/18 >> test time 10 hrs 1 min.  Basal SpO2 89.8%, low SpO2 65%.  Spent 202.8 min with SpO2 < 88%.   Please let her know her oxygen level is low at night.  Please arrange for home oxygen set up with 2 liters oxygen at night.  Please arrange for ONO with 2 liters to make sure that she doesn't need higher flow rate of oxygen at night.

## 2018-05-25 ENCOUNTER — Ambulatory Visit: Payer: Medicare Other | Admitting: Cardiovascular Disease

## 2018-05-25 ENCOUNTER — Encounter: Payer: Self-pay | Admitting: Cardiovascular Disease

## 2018-05-25 VITALS — BP 112/70 | HR 73 | Ht 66.0 in | Wt 120.8 lb

## 2018-05-25 DIAGNOSIS — R0600 Dyspnea, unspecified: Secondary | ICD-10-CM | POA: Diagnosis not present

## 2018-05-25 DIAGNOSIS — R079 Chest pain, unspecified: Secondary | ICD-10-CM | POA: Diagnosis not present

## 2018-05-25 LAB — BASIC METABOLIC PANEL
BUN / CREAT RATIO: 7 — AB (ref 12–28)
BUN: 5 mg/dL — AB (ref 8–27)
CHLORIDE: 95 mmol/L — AB (ref 96–106)
CO2: 24 mmol/L (ref 20–29)
Calcium: 10.1 mg/dL (ref 8.7–10.3)
Creatinine, Ser: 0.74 mg/dL (ref 0.57–1.00)
GFR calc Af Amer: 100 mL/min/{1.73_m2} (ref >59)
GFR calc non Af Amer: 86 mL/min/{1.73_m2} (ref >59)
Glucose: 89 mg/dL (ref 65–99)
Potassium: 4.5 mmol/L (ref 3.5–5.2)
SODIUM: 136 mmol/L (ref 134–144)

## 2018-05-25 LAB — PRO B NATRIURETIC PEPTIDE: NT-PRO BNP: 86 pg/mL (ref 0–287)

## 2018-05-25 NOTE — Patient Instructions (Addendum)
Medication Instructions:   If you need a refill on your cardiac medications before your next appointment, please call your pharmacy.   Lab work: Your physician recommends that you have lab work today- BMET and BNP  If you have labs (blood work) drawn today and your tests are completely normal, you will receive your results only by: Marland Kitchen MyChart Message (if you have MyChart) OR . A paper copy in the mail If you have any lab test that is abnormal or we need to change your treatment, we will call you to review the results.  Testing/Procedures: Your physician has requested that you have a lexiscan myoview. For further information please visit [email or web address]. Please follow instruction sheet, as given.  Follow-Up: At [facility], you and your health needs are our priority.  As part of our continuing mission to provide you with exceptional heart care, we have created designated Provider Care Teams.  These Care Teams include your primary Cardiologist (physician) and Advanced Practice Providers (APPs -  Physician Assistants and Nurse Practitioners) who all work together to provide you with the care you need, when you need it. You will need a follow up appointment in 12 months.  Please call our office 2 months in advance to schedule this appointment.  You may see Dr. Johnsie Cancel or one of the following Advanced Practice Providers on your designated Care Team:   Truitt Merle, NP Cecilie Kicks, NP . Kathyrn Drown, NP

## 2018-05-25 NOTE — Telephone Encounter (Signed)
Attempted to call patient today regarding results. I did not receive an answer at time of call. I have left a voicemail message for pt to return call. X1  

## 2018-05-30 ENCOUNTER — Other Ambulatory Visit: Payer: Self-pay | Admitting: General Surgery

## 2018-05-30 ENCOUNTER — Telehealth: Payer: Self-pay | Admitting: Pulmonary Disease

## 2018-05-30 ENCOUNTER — Telehealth (HOSPITAL_COMMUNITY): Payer: Self-pay | Admitting: *Deleted

## 2018-05-30 DIAGNOSIS — G4736 Sleep related hypoventilation in conditions classified elsewhere: Secondary | ICD-10-CM

## 2018-05-30 DIAGNOSIS — J439 Emphysema, unspecified: Principal | ICD-10-CM

## 2018-05-30 NOTE — Telephone Encounter (Signed)
Called patient and advised her of Dr. Juanetta Gosling recommendation for 2L of O2 at night ad to arrange ONO to make sure that she does not need higher flow rate of oxygen at night. Patient confirmed [facility] would be fine to submit the order to. Advised her she will be contacted regarding the ONO once set up.  Patient voiced understanding, northing further needed at this time.

## 2018-05-30 NOTE — Telephone Encounter (Signed)
Patient given detailed instructions per Myocardial Perfusion Study Information Sheet for the test on 06/04/18. Patient notified to arrive 15 minutes early and that it is imperative to arrive on time for appointment to keep from having the test rescheduled.  If you need to cancel or reschedule your appointment, please call the office within 24 hours of your appointment. . Patient verbalized understanding. Kirstie Peri, RN

## 2018-06-01 NOTE — Telephone Encounter (Signed)
Please see phone note dated 05/30/18.

## 2018-06-01 NOTE — Telephone Encounter (Signed)
Patient has been made aware of results. Nothing further is needed at this time.

## 2018-06-04 ENCOUNTER — Ambulatory Visit (HOSPITAL_COMMUNITY): Payer: Medicare Other

## 2018-06-12 ENCOUNTER — Encounter: Payer: Self-pay | Admitting: Pulmonary Disease

## 2018-06-13 ENCOUNTER — Other Ambulatory Visit: Payer: Self-pay | Admitting: Pulmonary Disease

## 2018-06-13 ENCOUNTER — Ambulatory Visit (HOSPITAL_COMMUNITY): Payer: Self-pay | Admitting: Psychiatry

## 2018-06-14 NOTE — Telephone Encounter (Signed)
VS please advise on patients e-mail above. Thank you.

## 2018-06-20 ENCOUNTER — Telehealth: Payer: Self-pay | Admitting: Pulmonary Disease

## 2018-06-20 NOTE — Telephone Encounter (Signed)
ONO with 2 liters 06/12/18 >> test time 8 hrs 6 min.  Basal SpO2 97.5%, SpO2 low 92%.   Please let her know ONO shows excellent control of oxygen levels with 2 liters at night.

## 2018-06-21 MED ORDER — CLOTRIMAZOLE 10 MG MT TROC: 10.0000 mg | Freq: Every day | OROMUCOSAL | 0 refills | Status: DC

## 2018-06-21 NOTE — Telephone Encounter (Signed)
Attempted to call patient today regarding results. I did not receive an answer at time of call. I have left a voicemail message for pt to return call. X1  

## 2018-06-21 NOTE — Telephone Encounter (Signed)
Ok can you just ask if she has had thrush before and if there is white patches   If so , then would send Mycelex troches #35 1 troche five times daily for 1 week    Please contact office for sooner follow up if symptoms do not improve or worsen or seek emergency care

## 2018-06-25 ENCOUNTER — Inpatient Hospital Stay: Admission: RE | Admit: 2018-06-25 | Payer: Self-pay | Source: Ambulatory Visit

## 2018-06-26 NOTE — Telephone Encounter (Signed)
Attempted to call patient today regarding results. I did not receive an answer at time of call. I have left a voicemail message for pt to return call. X2  

## 2018-06-27 NOTE — Telephone Encounter (Signed)
Called and spoke with patient regarding results.  Informed the patient of results and recommendations today. Pt verbalized understanding and denied any questions or concerns at this time.  Nothing further needed.  

## 2018-07-04 ENCOUNTER — Ambulatory Visit: Payer: Self-pay | Admitting: Pulmonary Disease

## 2018-07-09 ENCOUNTER — Encounter (HOSPITAL_COMMUNITY): Payer: Medicare Other | Admitting: Psychiatry

## 2018-07-09 ENCOUNTER — Other Ambulatory Visit: Payer: Self-pay

## 2018-07-09 NOTE — Progress Notes (Deleted)
Patient did not answer phone when called twice. I left a message to call office and reschedule her appointment.  O.Alexi Geibel MD

## 2018-07-10 NOTE — Progress Notes (Signed)
This encounter was created in error - please disregard.

## 2018-07-11 ENCOUNTER — Other Ambulatory Visit: Payer: Self-pay

## 2018-07-11 ENCOUNTER — Ambulatory Visit (INDEPENDENT_AMBULATORY_CARE_PROVIDER_SITE_OTHER): Payer: Medicare Other | Admitting: Psychiatry

## 2018-07-11 DIAGNOSIS — F330 Major depressive disorder, recurrent, mild: Secondary | ICD-10-CM

## 2018-07-11 DIAGNOSIS — F410 Panic disorder [episodic paroxysmal anxiety] without agoraphobia: Secondary | ICD-10-CM

## 2018-07-11 MED ORDER — ALPRAZOLAM 0.25 MG PO TABS: 0.2500 mg | ORAL_TABLET | Freq: Three times a day (TID) | ORAL | 0 refills | Status: DC | PRN

## 2018-07-11 MED ORDER — BUPROPION HCL ER (SR) 200 MG PO TB12: 200.0000 mg | ORAL_TABLET | Freq: Two times a day (BID) | ORAL | 0 refills | Status: DC

## 2018-07-11 NOTE — Progress Notes (Signed)
BH MD/PA/NP OP Progress Note  07/11/2018 11:14 AM Brandy Russell  MRN:  [id number] Interview was conducted using teleconferencing and I verified that I was speaking with the correct person using two identifiers. I discussed the limitations of evaluation and management by telemedicine and  the availability of in person appointments. Patient expressed understanding and agreed to proceed.  Chief Complaint: Still some anxiety, less depressed.  HPI: 63 yo divorced female with long hx of  depression (dx with MDD in 1971) and occasional panic attacks who came last month to establish regular psychiatric care and review appropriateness of her medications. Her symptom intensity suggested moderate level of depression. She has had a good response to bupropion which she has been on for 17 years. Poor prior response to SSRIs. She is prescribed alprazolam at a low dose as needed for panic anxiety but takes it very sparingly. There is no SI/HI, no psychotic sx. She is in sustained remission of alcohol problem drinking. Patient has advanced COPD which may be contributing to some of ner sx (fatugue, low motivation). She is now on supplemental oxygen at night and, in combination with increased dose of bupropion her daytime fatigue has diminished. We have continued alprazolam 0.25 mg prn anxiety - she uses it very sparingly. Lives alone and is justifiably worried about length of social isolation/stay at home due to corona virus. Friends are doing shopping for her.  Visit Diagnosis:    ICD-10-CM   1. Panic disorder F41.0   2. Mild episode of recurrent major depressive disorder (HCC) F33.0     Past Psychiatric History: Please see intake H&P.  Past Medical History:  Past Medical History:  Diagnosis Date  . Allergy   . Anxiety and depression   . CAD (coronary artery disease)    incidental finding on CT scan for lung ca screening  . Constipation   . Depression   . Emphysema   . Emphysema of lung ([place])   . GERD  (gastroesophageal reflux disease)   . Herpes    face, uses suppresive therapy intermittently  . Seasonal allergies     Past Surgical History:  Procedure Laterality Date  . COLONOSCOPY    . DILATION AND CURETTAGE OF UTERUS    . POLYPECTOMY    . TONSILLECTOMY AND ADENOIDECTOMY  1962    Family Psychiatric History: Reviewed.  Family History:  Family History  Problem Relation Age of Onset  . Alcohol abuse Mother   . Uterine cancer Mother   . Emphysema Mother   . Alcohol abuse Father   . Lung cancer Father   . Hypertension Father   . Emphysema Father   . Melanoma Brother   . Colon cancer Neg Hx   . Esophageal cancer Neg Hx   . Rectal cancer Neg Hx   . Stomach cancer Neg Hx   . Colon polyps Neg Hx     Social History:  Social History   Socioeconomic History  . Marital status: Divorced    Spouse name: Not on file  . Number of children: 0  . Years of education: Not on file  . Highest education level: Bachelor's degree (e.g., BA, AB, BS)  Occupational History  . Occupation: artists  Social Needs  . Financial resource strain: Not on file  . Food insecurity:    Worry: Not on file    Inability: Not on file  . Transportation needs:    Medical: Not on file    Non-medical: Not on file  Tobacco Use  .  Smoking status: Former Smoker    Packs/day: 2.00    Years: 35.00    Pack years: 70.00    Types: Cigarettes    Last attempt to quit: 03/28/2006    Years since quitting: 12.2  . Smokeless tobacco: Never Used  Substance and Sexual Activity  . Alcohol use: No  . Drug use: No  . Sexual activity: Never  Lifestyle  . Physical activity:    Days per week: Not on file    Minutes per session: Not on file  . Stress: Not on file  Relationships  . Social connections:    Talks on phone: Not on file    Gets together: Not on file    Attends religious service: Not on file    Active member of club or organization: Not on file    Attends meetings of clubs or organizations: Not on  file    Relationship status: Not on file  Other Topics Concern  . 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 Levaquin [Levofloxacin] Other (See Comments)    tendonitis  . Penicillins   . Sulfa Antibiotics     GI upset per the pt    Metabolic Disorder Labs: Lab Results  Component Value Date   HGBA1C 5.5 09/19/2013   No results found for: PROLACTIN Lab Results  Component Value Date   CHOL 250 (H) 11/11/2014   TRIG 55.0 11/11/2014   HDL 133.20 11/11/2014   CHOLHDL 2 11/11/2014   VLDL 11.0 11/11/2014   LDLCALC 106 (H) 11/11/2014   LDLCALC 127 (H) 09/19/2013   Lab Results  Component Value Date   TSH 1.13 09/19/2013    Therapeutic Level Labs: No results found for: LITHIUM No results found for: VALPROATE No components found for:  CBMZ  Current Medications: Current Outpatient Medications  Medication Sig Dispense Refill  . acyclovir (ZOVIRAX) 400 MG tablet Take 1 tablet (400 mg total) by mouth 5 (five) times daily. 60 tablet 3  . albuterol (PROAIR HFA) 108 (90 Base) MCG/ACT inhaler Inhale 2 puffs into the lungs every 6 (six) hours as needed for wheezing or shortness of breath. 3 Inhaler 3  . ALPRAZolam (XANAX) 0.25 MG tablet TAKE 1 TABLET BY MOUTH THREE TIMES DAILY AS NEEDED 90 tablet 3  . bisacodyl (DULCOLAX) 5 MG EC tablet Take 5 mg by mouth daily as needed for moderate constipation. Use as directed per colonoscopy instructions    . buPROPion (WELLBUTRIN SR) 200 MG 12 hr tablet Take 1 tablet (200 mg total) by mouth 2 (two) times daily. 180 tablet 0  . clotrimazole (MYCELEX) 10 MG troche Take 1 tablet (10 mg total) by mouth 5 (five) times daily. 35 tablet 0  . diclofenac sodium (VOLTAREN) 1 % GEL Apply 1  application topically daily as needed.  2  . furosemide (LASIX) 20 MG tablet Take 0.5 tablets (10 mg total) by mouth daily. 45 tablet 3  . ibuprofen (ADVIL,MOTRIN) 200 MG tablet Take 200 mg by mouth every 6 (six) hours as needed.    Marland Kitchen omeprazole (PRILOSEC) 20 MG capsule Take 20 mg by mouth 2 (  two) times daily before a meal.     . pravastatin (PRAVACHOL) 20 MG tablet Take 1 tablet by mouth daily.  1  . TRELEGY ELLIPTA 100-62.5-25 MCG/INH AEPB INHALE 1 PUFF INTO THE LUNGS DAILY 60 each 5   No current facility-administered medications for this visit.      Psychiatric Specialty Exam: Review of Systems  Respiratory: Positive for shortness of breath.   Gastrointestinal: Positive for constipation.  Psychiatric/Behavioral: The patient is nervous/anxious.   All other systems reviewed and are negative.   There were no vitals taken for this visit.There is no height or weight on file to calculate BMI.  General Appearance: NA  Eye Contact:  NA  Speech:  Clear and Coherent  Volume:  Normal  Mood:  Anxious  Affect:  NA  Thought Process:  Goal Directed  Orientation:  Full (Time, Place, and Person)  Thought Content: Logical   Suicidal Thoughts:  No  Homicidal Thoughts:  No  Memory:  Immediate;   Good Recent;   Good Remote;   Good  Judgement:  Intact  Insight:  Good  Psychomotor Activity:  NA  Concentration:  Concentration: Good  Recall:  Good  Fund of Knowledge: Good  Language: Good  Akathisia:  NA  Handed:  Right  AIMS (if indicated): not done  Assets:  Communication Skills Desire for Improvement Housing Resilience Talents/Skills  ADL's:  Intact  Cognition: WNL  Sleep:  Good   Screenings: PHQ2-9     PULMONARY REHAB COPD ORIENTATION from 07/23/2012 in [place]  PHQ-2 Total Score  3  PHQ-9 Total Score  12       Assessment and Plan: 63 yo divorced female with long hx of  depression (dx with MDD in 1971) and occasional panic attacks who came  last month to establish regular psychiatric care and review appropriateness of her medications. Her symptom intensity suggested moderate level of depression. She has had a good response to bupropion which she has been on for 17 years. Poor prior response to SSRIs. She is prescribed alprazolam at a low dose as needed for panic anxiety but takes it very sparingly. There is no SI/HI, no psychotic sx. She is in sustained remission of alcohol problem drinking. Patient has advanced COPD which may be contributing to some of ner sx (fatugue, low motivation). She is now on supplemental oxygen at night and, in combination with increased dose of bupropion her daytime fatigue has diminished. We have continued alprazolam 0.25 mg prn anxiety - she uses it very sparingly. Lives alone and is justifiably worried about length of social isolation/stay at home due to corona virus. Friends are doing shopping for her. We will continue bupropion SR 200 mg bid and alprazolam 0.25 mg tid prn anxiety. Follow up visit in 2 months.    Stephanie Acre, MD 07/11/2018, 11:14 AM

## 2018-07-29 ENCOUNTER — Other Ambulatory Visit: Payer: Self-pay

## 2018-07-29 ENCOUNTER — Emergency Department (HOSPITAL_COMMUNITY)
Admission: EM | Admit: 2018-07-29 | Discharge: 2018-07-30 | Disposition: A | Discharge status: Other Acute Inpatient Hospital | Payer: Medicare Other | Attending: Emergency Medicine | Admitting: Emergency Medicine

## 2018-07-29 ENCOUNTER — Encounter (HOSPITAL_COMMUNITY): Payer: Self-pay | Admitting: *Deleted

## 2018-07-29 DIAGNOSIS — I2510 Atherosclerotic heart disease of native coronary artery without angina pectoris: Secondary | ICD-10-CM | POA: Diagnosis not present

## 2018-07-29 DIAGNOSIS — Z79899 Other long term (current) drug therapy: Secondary | ICD-10-CM | POA: Diagnosis not present

## 2018-07-29 DIAGNOSIS — F332 Major depressive disorder, recurrent severe without psychotic features: Secondary | ICD-10-CM | POA: Diagnosis not present

## 2018-07-29 DIAGNOSIS — R45851 Suicidal ideations: Secondary | ICD-10-CM | POA: Diagnosis not present

## 2018-07-29 DIAGNOSIS — Z1159 Encounter for screening for other viral diseases: Secondary | ICD-10-CM | POA: Diagnosis not present

## 2018-07-29 DIAGNOSIS — Z87891 Personal history of nicotine dependence: Secondary | ICD-10-CM | POA: Diagnosis not present

## 2018-07-29 DIAGNOSIS — Z046 Encounter for general psychiatric examination, requested by authority: Secondary | ICD-10-CM | POA: Diagnosis not present

## 2018-07-29 DIAGNOSIS — J449 Chronic obstructive pulmonary disease, unspecified: Secondary | ICD-10-CM | POA: Diagnosis not present

## 2018-07-29 DIAGNOSIS — F329 Major depressive disorder, single episode, unspecified: Secondary | ICD-10-CM | POA: Diagnosis present

## 2018-07-29 LAB — ACETAMINOPHEN LEVEL: Acetaminophen (Tylenol), Serum: <10 ug/mL — ABNORMAL LOW (ref 10–30)

## 2018-07-29 LAB — CBC
HCT: 38.6 % (ref 36.0–46.0)
Hemoglobin: 12.7 g/dL (ref 12.0–15.0)
MCH: 34.1 pg — ABNORMAL HIGH (ref 26.0–34.0)
MCHC: 32.9 g/dL (ref 30.0–36.0)
MCV: 103.8 fL — ABNORMAL HIGH (ref 80.0–100.0)
Platelets: 253 10*3/uL (ref 150–400)
RBC: 3.72 MIL/uL — ABNORMAL LOW (ref 3.87–5.11)
RDW: 12.4 % (ref 11.5–15.5)
WBC: 4.4 10*3/uL (ref 4.0–10.5)
nRBC: 0 % (ref 0.0–0.2)

## 2018-07-29 LAB — COMPREHENSIVE METABOLIC PANEL
ALT: 26 U/L (ref 0–44)
AST: 32 U/L (ref 15–41)
Albumin: 4.3 g/dL (ref 3.5–5.0)
Alkaline Phosphatase: 76 U/L (ref 38–126)
Anion gap: 13 (ref 5–15)
BUN: 7 mg/dL — ABNORMAL LOW (ref 8–23)
CO2: 23 mmol/L (ref 22–32)
Calcium: 9.6 mg/dL (ref 8.9–10.3)
Chloride: 99 mmol/L (ref 98–111)
Creatinine, Ser: 0.66 mg/dL (ref 0.44–1.00)
GFR calc Af Amer: >60 mL/min (ref >60)
GFR calc non Af Amer: >60 mL/min (ref >60)
Glucose, Bld: 129 mg/dL — ABNORMAL HIGH (ref 70–99)
Potassium: 4.2 mmol/L (ref 3.5–5.1)
Sodium: 135 mmol/L (ref 135–145)
Total Bilirubin: 0.3 mg/dL (ref 0.3–1.2)
Total Protein: 7 g/dL (ref 6.5–8.1)

## 2018-07-29 LAB — RAPID URINE DRUG SCREEN, HOSP PERFORMED
Amphetamines: NOT DETECTED
Barbiturates: NOT DETECTED
Benzodiazepines: POSITIVE — AB
Cocaine: NOT DETECTED
Opiates: NOT DETECTED
Tetrahydrocannabinol: NOT DETECTED

## 2018-07-29 LAB — SALICYLATE LEVEL: Salicylate Lvl: <7 mg/dL (ref 2.8–30.0)

## 2018-07-29 LAB — ETHANOL: Alcohol, Ethyl (B): 122 mg/dL — ABNORMAL HIGH (ref <10)

## 2018-07-29 MED ORDER — PRAVASTATIN SODIUM 10 MG PO TABS
20.0000 mg | ORAL_TABLET | Freq: Every day | ORAL | Status: DC
  Administered 2018-07-30: 20 mg via ORAL
  Filled 2018-07-29: qty 2

## 2018-07-29 MED ORDER — FLUTICASONE FUROATE-VILANTEROL 100-25 MCG/INH IN AEPB
1.0000 | INHALATION_SPRAY | Freq: Every day | RESPIRATORY_TRACT | Status: DC
  Filled 2018-07-29: qty 28

## 2018-07-29 MED ORDER — BUPROPION HCL ER (SR) 100 MG PO TB12
200.0000 mg | ORAL_TABLET | Freq: Two times a day (BID) | ORAL | Status: DC
  Administered 2018-07-30: 200 mg via ORAL
  Filled 2018-07-29 (×3): qty 2

## 2018-07-29 MED ORDER — ALBUTEROL SULFATE HFA 108 (90 BASE) MCG/ACT IN AERS
2.0000 | INHALATION_SPRAY | Freq: Four times a day (QID) | RESPIRATORY_TRACT | Status: DC | PRN
  Administered 2018-07-29 – 2018-07-30 (×2): 2 via RESPIRATORY_TRACT
  Filled 2018-07-29 (×2): qty 6.7

## 2018-07-29 MED ORDER — UMECLIDINIUM BROMIDE 62.5 MCG/INH IN AEPB
1.0000 | INHALATION_SPRAY | Freq: Every day | RESPIRATORY_TRACT | Status: DC
  Filled 2018-07-29: qty 7

## 2018-07-29 MED ORDER — LORAZEPAM 1 MG PO TABS
1.0000 mg | ORAL_TABLET | Freq: Once | ORAL | Status: AC
  Administered 2018-07-29: 22:00:00 1 mg via ORAL
  Filled 2018-07-29: qty 1

## 2018-07-29 MED ORDER — FLUTICASONE-UMECLIDIN-VILANT 100-62.5-25 MCG/INH IN AEPB: INHALATION_SPRAY | Freq: Every day | RESPIRATORY_TRACT | Status: DC

## 2018-07-29 MED ORDER — PANTOPRAZOLE SODIUM 40 MG PO TBEC
40.0000 mg | DELAYED_RELEASE_TABLET | Freq: Every day | ORAL | Status: DC
  Administered 2018-07-30: 09:00:00 40 mg via ORAL
  Filled 2018-07-29: qty 1

## 2018-07-29 NOTE — ED Notes (Signed)
Pt is refusing labs, states she needs her "meds and something to eat"

## 2018-07-29 NOTE — ED Provider Notes (Signed)
[facility] EMERGENCY DEPARTMENT Provider Note   CSN: [id number] Arrival date & time: 07/29/18  1702    History   Chief Complaint Chief Complaint  Patient presents with  . Suicidal    HPI Brandy Russell is a 63 y.o. female.     HPI Is brought to the emergency department for suicidal ideation.  Patient reports that she spent all morning trying to call for help and did not get any response.  She reports now she has been in the emergency department for 2 hours and absolutely nobody cares about what happens to her.  She states that she mind will just go into a store without a mask and catch coronavirus.  She reports people like her just do not matter at all.  She does not even need to exist. Past Medical History:  Diagnosis Date  . Allergy   . Anxiety and depression   . CAD (coronary artery disease)    incidental finding on CT scan for lung ca screening  . Constipation   . Depression   . Emphysema   . Emphysema of lung ([place])   . GERD (gastroesophageal reflux disease)   . Herpes    face, uses suppresive therapy intermittently  . Seasonal allergies     Patient Active Problem List   Diagnosis Date Noted  . Nocturnal hypoxemia due to emphysema ([place]) 05/24/2018  . Major depressive disorder, recurrent episode, moderate ([place]) 05/16/2018  . Panic disorder 05/16/2018  . Community acquired pneumonia 07/26/2017  . Coronary artery disease involving native coronary artery of native heart without angina pectoris 11/11/2014  . Depression and Anxiety 11/11/2014  . CN (constipation) 11/11/2014  . Herpes 11/11/2014  . Ejection fraction < 50% 05/30/2012  . COPD with emphysema ([place]) 04/16/2012  . Depression 04/16/2012    Past Surgical History:  Procedure Laterality Date  . COLONOSCOPY    . DILATION AND CURETTAGE OF UTERUS    . POLYPECTOMY    . TONSILLECTOMY AND ADENOIDECTOMY  1962     OB History   No obstetric history on file.      Home Medications     Prior to Admission medications   Medication Sig Start Date End Date Taking? Authorizing Provider  acyclovir (ZOVIRAX) 400 MG tablet Take 1 tablet (400 mg total) by mouth 5 (five) times daily. 09/17/14   Lucretia Kern, DO  albuterol [facility] HFA) 108 [phone] Base) MCG/ACT inhaler Inhale 2 puffs into the lungs every 6 (six) hours as needed for wheezing or shortness of breath. 08/23/17   Chesley Mires, MD  ALPRAZolam Duanne Moron) 0.25 MG tablet Take 1 tablet (0.25 mg total) by mouth 3 (three) times daily as needed. 07/11/18 10/09/18  Pucilowski, Marchia Bond, MD  bisacodyl (DULCOLAX) 5 MG EC tablet Take 5 mg by mouth daily as needed for moderate constipation. Use as directed per colonoscopy instructions    [provider]  buPROPion (WELLBUTRIN SR) 200 MG 12 hr tablet Take 1 tablet (200 mg total) by mouth 2 (two) times daily. 07/11/18 10/09/18  Pucilowski, Marchia Bond, MD  clotrimazole (MYCELEX) 10 MG troche Take 1 tablet (10 mg total) by mouth 5 (five) times daily. 06/21/18   Parrett, Fonnie Mu, NP  diclofenac sodium (VOLTAREN) 1 % GEL Apply 1 application topically daily as needed. 08/26/17   [provider]  furosemide (LASIX) 20 MG tablet Take 0.5 tablets (10 mg total) by mouth daily. 04/30/18   Josue Hector, MD  ibuprofen (ADVIL,MOTRIN) 200 MG tablet  Take 200 mg by mouth every 6 (six) hours as needed.    [provider]  omeprazole (PRILOSEC) 20 MG capsule Take 20 mg by mouth 2 (two) times daily before a meal.     [provider]  pravastatin (PRAVACHOL) 20 MG tablet Take 1 tablet by mouth daily. 02/19/18   [provider]  Donnal Debar 100-62.5-25 MCG/INH AEPB INHALE 1 PUFF INTO THE LUNGS DAILY 06/13/18   Chesley Mires, MD    Family History Family History  Problem Relation Age of Onset  . Alcohol abuse Mother   . Uterine cancer Mother   . Emphysema Mother   . Alcohol abuse Father   . Lung cancer Father   . Hypertension Father   . Emphysema Father   . Melanoma Brother    . Colon cancer Neg Hx   . Esophageal cancer Neg Hx   . Rectal cancer Neg Hx   . Stomach cancer Neg Hx   . Colon polyps Neg Hx     Social History Social History   Tobacco Use  . Smoking status: Former Smoker    Packs/day: 2.00    Years: 35.00    Pack years: 70.00    Types: Cigarettes    Last attempt to quit: 03/28/2006    Years since quitting: 12.3  . Smokeless tobacco: Never Used  Substance Use Topics  . Alcohol use: No  . Drug use: No     Allergies   Levaquin [levofloxacin]; Penicillins; and Sulfa antibiotics   Review of Systems Review of Systems 10 Systems reviewed and are negative for acute change except as noted in the HPI.   Physical Exam Updated Vital Signs BP 135/80 (BP Location: Right Arm)   Pulse 85   Temp 98.4 F (36.9 C) (Oral)   Resp 14   Ht 5\' 4"  (1.626 m)   Wt 54.4 kg   SpO2 95%   BMI 20.60 kg/m   Physical Exam Constitutional:      Comments: Patient is alert.  Well-nourished well-developed.  No respiratory distress.  HENT:     Head: Normocephalic and atraumatic.     Nose: Nose normal.     Mouth/Throat:     Pharynx: Oropharynx is clear.  Eyes:     Extraocular Movements: Extraocular movements intact.  Cardiovascular:     Rate and Rhythm: Normal rate and regular rhythm.  Pulmonary:     Comments: No respiratory distress.  Breath sounds are slightly soft throughout.  Adequate airflow. Abdominal:     General: There is no distension.     Palpations: Abdomen is soft.     Tenderness: There is no abdominal tenderness. There is no guarding.  Musculoskeletal: Normal range of motion.        General: No swelling or tenderness.     Right lower leg: No edema.     Left lower leg: No edema.  Skin:    General: Skin is warm and dry.  Neurological:     General: No focal deficit present.     Mental Status: She is oriented to person, place, and time.     Coordination: Coordination normal.  Psychiatric:     Comments: Patient's mood is withdrawn and  slightly hostile.  No events of responding to internal stimuli.      ED Treatments / Results  Labs (all labs ordered are listed, but only abnormal results are displayed) Labs Reviewed  COMPREHENSIVE METABOLIC PANEL  ETHANOL  SALICYLATE LEVEL  ACETAMINOPHEN LEVEL  CBC  RAPID URINE DRUG SCREEN, HOSP PERFORMED    EKG None  Radiology No results found.  Procedures Procedures (including critical care time)  Medications Ordered in ED Medications  albuterol (VENTOLIN HFA) 108 (90 Base) MCG/ACT inhaler 2 puff (has no administration in time range)  buPROPion (WELLBUTRIN SR) 12 hr tablet 200 mg (has no administration in time range)  pantoprazole (PROTONIX) EC tablet 40 mg (has no administration in time range)  pravastatin (PRAVACHOL) tablet 20 mg (has no administration in time range)  Fluticasone-Umeclidin-Vilant 100-62.5-25 MCG/INH AEPB (has no administration in time range)     Initial Impression / Assessment and Plan / ED Course  I have reviewed the triage vital signs and the nursing notes.  Pertinent labs & imaging results that were available during my care of the patient were reviewed by me and considered in my medical decision making (see chart for details).       Patient presents with suicidal ideation.  Patient is very dejected and reports that she just does not matter at all and does not need to exist.  It is hard to tell exactly what is generated these feelings because the patient is somewhat withdrawn and does not reveal all of her thoughts.  She reports that she was thinking about hurting herself earlier in the day but couldn't get any help even though she spent 6 hours on the phone.  She does however express dejection over her COPD and CHF.  She seems angry that other people may not wear masks when they go out to the grocery store.  She reports that she has to sit in her car and is afraid to go in because with her emphysema it just might end up killing her.   Final  Clinical Impressions(s) / ED Diagnoses   Final diagnoses:  Severe episode of recurrent major depressive disorder, without psychotic features [facility])  Suicidal ideation    ED Discharge Orders    None       Charlesetta Shanks, MD 07/29/18 1857

## 2018-07-29 NOTE — ED Triage Notes (Signed)
PT arrived to ED via [employer] for Suicidal  Thoughts . Pt reports she plans to walk from store to store with out a mask so she can catch COVID-19 and die.

## 2018-07-29 NOTE — Progress Notes (Signed)
Received Brandy Russell this PM in room 52, alert and feeling anxious about her medications. Pharmacy in to review her medications. She is cooperative at the present time. She talked with TTS via video. TTS is looking for placement related to her O2 administration at [facility]. The sitter arrived at 2345 hrs and O2 was started at 2 liters per order. She slept throughout the night after receiving ear plugs.

## 2018-07-29 NOTE — ED Triage Notes (Signed)
[facility] Paper work faxed

## 2018-07-29 NOTE — ED Notes (Signed)
ALL belongings inventoried - 1 labeled belongings bag and 1 black duffle bag - placed in [place] #1 - Valuables to Security, and Home Meds to Pharmacy.

## 2018-07-29 NOTE — BH Assessment (Addendum)
Tele Assessment Note   Patient Name: Brandy Russell MRN: [id number] Referring Physician: Charlesetta Shanks, MD Location of Patient: [facility], room 052C Location of Provider: [place]  Brandy Russell is an 63 y.o. divorced female who presents unaccompanied to [facility] reporting symptoms of depression including suicidal ideation. Pt says she is being treated for depression and has been increasingly depressed for the past month. She says she spent the morning calling several different agencies trying to get help before finally asking a neighbor to bring her to the emergency department. Pt says she is currently suicidal with plan to interact with people at stores to contract COVID-19 and die. She says wants to die from a disease rather than an obvious suicide because her sister's husband and daughter both died by suicide and "I couldn't put her through that." She reports two previous suicide attempts by overdose as an adolescent, one requiring extensive stay on a medical unit. Pt acknowledges symptoms including crying spells, social withdrawal, loss of interest in usual pleasures, fatigue, irritability, decreased concentration and feelings of worthlessness and hopelessness. Pt denies any history of intentional self-injurious behaviors. Pt denies current homicidal ideation or history of violence. Pt denies any history of auditory or visual hallucinations. She reports she has a history of alcohol use but had over 10 years sobriety until relapsing 30 days ago. She reports drinking one bottle wine every 1-2 days. She denies other substance use.  Pt says she has had no human contact in 50 days due to COVID-19 restrictions. She lives alone with her dog and is isolating because she has stage 4 emphysema. She states that she feels elderly people like herself are "disposable". She says today hospital staff and other professionals have been dismissive to her and don't seem to care. Pt  says she feels very lonely. She identifies her sister as her primary support. She is currently receiving outpatient medication management with Dr. Alejandro Mulling and says he is a good psychiatrist. She says she has never received inpatient psychiatric treatment but has been treated for alcohol abuse at age 2.  Pt is dressed in hospital scrubs, alert and oriented x4. Pt speaks in a clear tone, at moderate volume and normal pace. Motor behavior appears normal. Eye contact is good and Pt is at times tearful. Pt's mood is depressed and affect is congruent with mood. Thought process is coherent and relevant. There is no indication Pt is currently responding to internal stimuli or experiencing delusional thought content. Pt was cooperative throughout assessment and says she is willing to be admitted to a psychiatric facility. Pt's medical record indicated she has been petitioned for involuntary commitment.  Diagnosis:  F33.2 Major depressive disorder, Recurrent episode, Severe F10.20 Alcohol use disorder, Severe  Past Medical History:  Past Medical History:  Diagnosis Date  . Allergy   . Anxiety and depression   . CAD (coronary artery disease)    incidental finding on CT scan for lung ca screening  . Constipation   . Depression   . Emphysema   . Emphysema of lung ([place])   . GERD (gastroesophageal reflux disease)   . Herpes    face, uses suppresive therapy intermittently  . Seasonal allergies     Past Surgical History:  Procedure Laterality Date  . COLONOSCOPY    . DILATION AND CURETTAGE OF UTERUS    . POLYPECTOMY    . TONSILLECTOMY AND ADENOIDECTOMY  1962    Family History:  Family History  Problem Relation Age of Onset  . Alcohol abuse Mother   . Uterine cancer Mother   . Emphysema Mother   . Alcohol abuse Father   . Lung cancer Father   . Hypertension Father   . Emphysema Father   . Melanoma Brother   . Colon cancer Neg Hx   . Esophageal cancer Neg Hx   . Rectal cancer Neg Hx    . Stomach cancer Neg Hx   . Colon polyps Neg Hx     Social History:  reports that she quit smoking about 12 years ago. Her smoking use included cigarettes. She has a 70.00 pack-year smoking history. She has never used smokeless tobacco. She reports that she does not drink alcohol or use drugs.  Additional Social History:  Alcohol / Drug Use Pain Medications: See MAR Prescriptions: See MAR Over the Counter: See MAR History of alcohol / drug use?: Yes Longest period of sobriety (when/how long): 19 years Negative Consequences of Use: Personal relationships Withdrawal Symptoms: (Pt denies) Substance #1 Name of Substance 1: Alcohol 1 - Age of First Use: 15 1 - Amount (size/oz): 1 bottle of wine 1 - Frequency: every 1-2 days 1 - Duration: Relapsed one month ago 1 - Last Use / Amount: 07/29/18  CIWA: CIWA-Ar BP: 135/80 Pulse Rate: 85 COWS:    Allergies:  Allergies  Allergen Reactions  . Levaquin [Levofloxacin] Other (See Comments)    tendonitis  . Penicillins Swelling    Feet swelling/ Did it involve swelling of the face/tongue/throat, SOB, or low BP? Unknown Did it involve sudden or severe rash/hives, skin peeling, or any reaction on the inside of your mouth or nose? Unknown Did you need to seek medical attention at a hospital or doctor's office? Unknown When did it last happen?young child If all above answers are "NO", may proceed with cephalosporin use.   . Sulfa Antibiotics Nausea And Vomiting    Home Medications: (Not in a hospital admission)   OB/GYN Status:  No LMP recorded. Patient is postmenopausal.  General Assessment Data Location of Assessment: [facility] ED TTS Assessment: In system Is this a Tele or Face-to-Face Assessment?: Tele Assessment Is this an Initial Assessment or a Re-assessment for this encounter?: Initial Assessment Patient Accompanied by:: N/A(Alone) Language Other than English: No Living Arrangements: Other (Comment)(Lives alone) What  gender do you identify as?: Female Marital status: Divorced [place] name: NA Pregnancy Status: No Living Arrangements: Alone Can pt return to current living arrangement?: Yes Admission Status: Involuntary Petitioner: ED Attending Is patient capable of signing voluntary admission?: Yes Referral Source: Self/Family/Friend Insurance type: [employer]     Crisis Care Plan Living Arrangements: Alone Legal Guardian: Other:(Self) Name of Psychiatrist: Dr. Alejandro Mulling Name of Therapist: None  Education Status Is patient currently in school?: No Is the patient employed, unemployed or receiving disability?: Receiving disability income  Risk to self with the past 6 months Suicidal Ideation: Yes-Currently Present Has patient been a risk to self within the past 6 months prior to admission? : Yes Suicidal Intent: Yes-Currently Present Has patient had any suicidal intent within the past 6 months prior to admission? : Yes Is patient at risk for suicide?: Yes Suicidal Plan?: Yes-Currently Present Has patient had any suicidal plan within the past 6 months prior to admission? : Yes Specify Current Suicidal Plan: Become infected with COVID-19 Access to Means: No What has been your use of drugs/alcohol within the last 12 months?: Pt reports drinking alcohol Previous Attempts/Gestures: Yes How many  times?: 2(Overdosed as adolscent) Other Self Harm Risks: None Triggers for Past Attempts: Family contact Intentional Self Injurious Behavior: None Family Suicide History: Yes(Brother-in-law, neice died by suicide) Recent stressful life event(s): Other (Comment)(COVID-19 restrictions) Persecutory voices/beliefs?: No Depression: Yes Depression Symptoms: Despondent, Tearfulness, Isolating, Fatigue, Loss of interest in usual pleasures, Feeling worthless/self pity, Feeling angry/irritable Substance abuse history and/or treatment for substance abuse?: Yes Suicide prevention information  given to non-admitted patients: Not applicable  Risk to Others within the past 6 months Homicidal Ideation: No Does patient have any lifetime risk of violence toward others beyond the six months prior to admission? : No Thoughts of Harm to Others: No Current Homicidal Intent: No Current Homicidal Plan: No Access to Homicidal Means: No Identified Victim: None History of harm to others?: No Assessment of Violence: None Noted Violent Behavior Description: Pt denies history of violence Does patient have access to weapons?: No Criminal Charges Pending?: No Does patient have a court date: No Is patient on probation?: No  Psychosis Hallucinations: None noted Delusions: None noted  Mental Status Report Appearance/Hygiene: In scrubs Eye Contact: Fair Motor Activity: Unremarkable Speech: Logical/coherent Level of Consciousness: Alert Mood: Depressed, Worthless, low self-esteem Affect: Depressed Anxiety Level: None Thought Processes: Coherent, Relevant Judgement: Partial Orientation: Person, Place, Time, Situation Obsessive Compulsive Thoughts/Behaviors: None  Cognitive Functioning Concentration: Normal Memory: Recent Intact, Remote Intact Is patient IDD: No Insight: Fair Impulse Control: Fair Appetite: Good Have you had any weight changes? : No Change Sleep: No Change Total Hours of Sleep: 8 Vegetative Symptoms: None  ADLScreening [facility] Assessment Services) Patient's cognitive ability adequate to safely complete daily activities?: Yes Patient able to express need for assistance with ADLs?: Yes Independently performs ADLs?: Yes (appropriate for developmental age)  Prior Inpatient Therapy Prior Inpatient Therapy: Yes Prior Therapy Dates: Age 75 Prior Therapy Facilty/Provider(s): Unknown Reason for Treatment: Alcohol treatment  Prior Outpatient Therapy Prior Outpatient Therapy: Yes Prior Therapy Dates: Current Prior Therapy Facilty/Provider(s): Dr. Alejandro Mulling Reason  for Treatment: Depression Does patient have an ACCT team?: No Does patient have Intensive In-House Services?  : No Does patient have [facility] services? : No Does patient have P4CC services?: No  ADL Screening (condition at time of admission) Patient's cognitive ability adequate to safely complete daily activities?: Yes Is the patient deaf or have difficulty hearing?: No Does the patient have difficulty seeing, even when wearing glasses/contacts?: No Does the patient have difficulty concentrating, remembering, or making decisions?: No Patient able to express need for assistance with ADLs?: Yes Does the patient have difficulty dressing or bathing?: No Independently performs ADLs?: Yes (appropriate for developmental age) Does the patient have difficulty walking or climbing stairs?: No Weakness of Legs: None Weakness of Arms/Hands: None  Home Assistive Devices/Equipment Home Assistive Devices/Equipment: Oxygen    Abuse/Neglect Assessment (Assessment to be complete while patient is alone) Abuse/Neglect Assessment Can Be Completed: Yes Physical Abuse: Denies Verbal Abuse: Yes, past (Comment)(Pt reports both parents were alcoholic.) Sexual Abuse: Denies Exploitation of patient/patient's resources: Denies Self-Neglect: Denies     Regulatory affairs officer (For Healthcare) Does Patient Have a Medical Advance Directive?: No Would patient like information on creating a medical advance directive?: Yes (ED - Information included in AVS)          Disposition: Lavell Luster, [facility] [place], confirmed Pt meets exclusionary criteria due to need for oxygen at night. Gave clinical report to Lindon Romp, NP who said Pt meets criteria for inpatient psychiatric treatment. TTS will contact other facilities for placement. Notified  Dr Carmin Muskrat and Mechele Claude, RN of recommendation.  Disposition Initial Assessment Completed for this Encounter: Yes  This service was provided via telemedicine using a  2-way, interactive audio and video technology.  Names of all persons participating in this telemedicine service and their role in this encounter. Name: CHERE BABSON Role: Patient  Name: Storm Frisk, [facility] Role: TTS counselor         Orpah Greek Anson Fret, [facility], [facility], [facility] Triage Specialist [phone]  Evelena Peat 07/29/2018 9:11 PM

## 2018-07-30 LAB — SARS CORONAVIRUS 2 BY RT PCR (HOSPITAL ORDER, PERFORMED IN ~~LOC~~ HOSPITAL LAB): SARS Coronavirus 2: NEGATIVE

## 2018-07-30 NOTE — ED Notes (Signed)
Lunch has been ordered  

## 2018-07-30 NOTE — Progress Notes (Signed)
Pt. meets criteria for inpatient treatment per Lindon Romp, NP.  No appropriate beds available at [facility]. Referred out to the following hospitals: [place] [facility]  [facility]  [facility]  [facility]     Disposition CSW will continue to follow for placement.  Areatha Keas. Judi Cong, MSW, [place] Disposition Clinical Social Work [phone] (cell) [phone] (office)

## 2018-07-30 NOTE — Progress Notes (Signed)
Pt accepted to [facility], Bed-TBD Jonelle Sports, MD is the accepting/attending provider.  Call report to  [place]  ED notified.   Pt is IVC .  Pt may be transported by [employer] Pt scheduled  to arrive at [facility] as soon as transport can be arranged.  Areatha Keas. Judi Cong, MSW, [place] Disposition Clinical Social Work [phone] (cell) [phone] (office)

## 2018-07-30 NOTE — ED Notes (Signed)
[facility] contacted about pt's breakfast tray. 

## 2018-07-30 NOTE — ED Provider Notes (Signed)
Received call that patient was being transferred to [facility].  Accepting physician is Dr. Jonelle Sports Patient presented yesterday for suicidal ideation and was here under IVC. Patient has a history of COPD and CHF. Patient is hemodynamically stable here in the ED labs appear generally within normal limits.  She appears stable for transfer.   Pattricia Boss, MD 07/30/18 [phone]

## 2018-07-30 NOTE — ED Notes (Signed)
Pt DCd off unit to [facility] per DR order. Pt cam, cooperative, no s/s. Pt DC information and [place] given to sheriff. Pt belongings given to sheriff. Pt ambulated off unit with sheriff. Pt transported by sheriff.

## 2018-08-06 ENCOUNTER — Telehealth: Payer: Self-pay | Admitting: *Deleted

## 2018-08-09 ENCOUNTER — Telehealth: Payer: Self-pay | Admitting: Pulmonary Disease

## 2018-08-09 ENCOUNTER — Ambulatory Visit (HOSPITAL_COMMUNITY): Payer: Medicare Other | Admitting: Licensed Clinical Social Worker

## 2018-08-09 DIAGNOSIS — F331 Major depressive disorder, recurrent, moderate: Secondary | ICD-10-CM

## 2018-08-09 NOTE — Telephone Encounter (Signed)
Spoke with patient and gave her Brian's recommendations. She stated that she will present to the ED for further testing.   Nothing needed at time of call.

## 2018-08-09 NOTE — Telephone Encounter (Signed)
With patient being asymptomatic I do not believe that she needs to be tested.  If patient would like to be tested and she can present to an emergency room like [facility] and they can triage her to be able to test her for COVID-19.  Patient also could contact the health department to see if they can coordinate outpatient testing for her.  Wyn Quaker, FNP

## 2018-08-09 NOTE — Telephone Encounter (Signed)
Patient is returning phone call.  Phone number is [phone].
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
## 2018-08-09 NOTE — Telephone Encounter (Signed)
Pt thinks she needs to be tested for COVID-19 Pt says she was committed un- voluntarily x9 days. She was not given a mask. She was in with about 28 other people. She says they withheld her meds. She was discharged on yesterday. Therefore she feels she needs to be tested for COVID-19. Has not travelled outside of state in past 14 days. She was in [place]. She says one of the people said they had been exposed greater than 14 days ago. Pt was asked if she had any of the following symptoms? Fever: no Cough: no SOB: no Bodyaches: no Runny Nose: no Sore throat: no

## 2018-08-16 ENCOUNTER — Encounter: Payer: Self-pay | Admitting: Cardiovascular Disease

## 2018-08-24 ENCOUNTER — Other Ambulatory Visit: Payer: Self-pay

## 2018-08-24 ENCOUNTER — Ambulatory Visit (INDEPENDENT_AMBULATORY_CARE_PROVIDER_SITE_OTHER)
Admission: RE | Admit: 2018-08-24 | Discharge: 2018-08-24 | Disposition: A | Discharge status: Home / Self Care | Payer: Medicare Other | Source: Ambulatory Visit | Attending: Pulmonary Disease | Admitting: Pulmonary Disease

## 2018-08-24 DIAGNOSIS — R918 Other nonspecific abnormal finding of lung field: Secondary | ICD-10-CM | POA: Diagnosis not present

## 2018-08-28 ENCOUNTER — Other Ambulatory Visit: Payer: Self-pay

## 2018-08-28 ENCOUNTER — Ambulatory Visit (INDEPENDENT_AMBULATORY_CARE_PROVIDER_SITE_OTHER): Payer: Medicare Other | Admitting: Psychiatry

## 2018-08-28 DIAGNOSIS — F410 Panic disorder [episodic paroxysmal anxiety] without agoraphobia: Secondary | ICD-10-CM

## 2018-08-28 DIAGNOSIS — F3341 Major depressive disorder, recurrent, in partial remission: Secondary | ICD-10-CM | POA: Insufficient documentation

## 2018-08-28 DIAGNOSIS — F1021 Alcohol dependence, in remission: Secondary | ICD-10-CM | POA: Diagnosis not present

## 2018-08-28 DIAGNOSIS — F3342 Major depressive disorder, recurrent, in full remission: Secondary | ICD-10-CM | POA: Insufficient documentation

## 2018-08-28 DIAGNOSIS — F330 Major depressive disorder, recurrent, mild: Secondary | ICD-10-CM | POA: Diagnosis not present

## 2018-08-28 NOTE — Progress Notes (Signed)
[facility] MD/PA/NP OP Progress Note  08/28/2018 10:53 AM Brandy Russell  MRN:  [id number] Interview was conducted by phone and I verified that I was speaking with the correct person using two identifiers. I discussed the limitations of evaluation and management by telemedicine and  the availability of in person appointments. Patient expressed understanding and agreed to proceed.  Chief Complaint: "I feel much better now"  HPI: 63 yo divorced female with long hx of depression (dx with MDD in 1971) and occasional panic attacks who came last month to establish regular psychiatric care and review appropriateness of her medications. Her symptom intensity suggested moderate level of depression. She has had a good response to bupropion which she has been on for 17 years. Poor prior response to SSRIs. She is prescribed alprazolam at a low dose as needed for panic anxiety but takes it sparingly. 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 In mid May she started to feel "worked up" about corona virus, increased anxiety, tried to call mental health and eventually was brought to ED with passive SI. She said that increased anxiety triggered relapse on alcohol (drank few glasses of wine) and this in turn triggered SI. She ultimately was sned to [facility] where she spend 9 days. Very unhappy with her treatment there, could not sleep and felt that nobody was listening to her there, She was started on gabapentin 300 mg tid and feels that this was helpful but decided to wait to talk to me about it before filling her Rx (mother was also on gabapentin). Brandy Russell has not continued to drink after release from [facility], denies craving, is less anxious and denies feeling depressed or suicidal. Ex-husband visits, she has a girl friend with whom she goes on walks. Appears to have good  insight into her anxiety and states that she "did this to herself" by isolating from  supportive connections and ruminating. Patient has advanced COPD which may be contributing to some of ner sx (fatugue, low motivation). She is on supplemental oxygen at night and, in combination with increased dose of bupropion her daytime fatigue has diminished. We have continued alprazolam 0.25 mg prn anxiety - she uses it sparingly.   Visit Diagnosis:    ICD-10-CM   1. Panic disorder F41.0   2. Mild episode of recurrent major depressive disorder (HCC) F33.0   3. 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  Past Medical History:  Past Medical History:  Diagnosis Date  . Allergy   . Anxiety and depression   . CAD (coronary artery disease)    incidental finding on CT scan for lung ca screening  . Constipation   . Depression   . Emphysema   . Emphysema of lung ([place])   . GERD (gastroesophageal reflux disease)   . Herpes    face, uses suppresive therapy intermittently  . Seasonal allergies     Past Surgical History:  Procedure Laterality Date  . COLONOSCOPY    . DILATION AND CURETTAGE OF UTERUS    . POLYPECTOMY    . TONSILLECTOMY AND ADENOIDECTOMY  1962    Family Psychiatric History: Reviewed.  Family History:  Family History  Problem Relation Age of Onset  . Alcohol abuse Mother   . Uterine cancer Mother   . Emphysema Mother   . Alcohol abuse Father   .  Lung cancer Father   . Hypertension Father   . Emphysema Father   . Melanoma Brother   . Colon cancer Neg Hx   . Esophageal cancer Neg Hx   . Rectal cancer Neg Hx   . Stomach cancer Neg Hx   . Colon polyps Neg Hx     Social History:  Social History   Socioeconomic History  . Marital status: Divorced    Spouse name: Not on file  . Number of children: 0  . Years of education: Not on file  . Highest education level: Bachelor's degree (e.g., BA, AB, BS)  Occupational History  . Occupation: artists  Social Needs  . Financial resource  strain: Not on file  . Food insecurity:    Worry: Not on file    Inability: Not on file  . Transportation needs:    Medical: Not on file    Non-medical: Not on file  Tobacco Use  . Smoking status: Former Smoker    Packs/day: 2.00    Years: 35.00    Pack years: 70.00    Types: Cigarettes    Last attempt to quit: 03/28/2006    Years since quitting: 12.4  . Smokeless tobacco: Never Used  Substance and Sexual Activity  . Alcohol use: No  . Drug use: No  . Sexual activity: Never  Lifestyle  . Physical activity:    Days per week: Not on file    Minutes per session: Not on file  . Stress: Not on file  Relationships  . Social connections:    Talks on phone: Not on file    Gets together: Not on file    Attends religious service: Not on file    Active member of club or organization: Not on file    Attends meetings of clubs or organizations: Not on file    Relationship status: Not on file  Other Topics Concern  . 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 Levaquin [Levofloxacin] Other (See Comments)    tendonitis  . Penicillins Swelling    Feet swelling/ Did it involve swelling of the face/tongue/throat, SOB, or low BP? Unknown Did it involve sudden or severe rash/hives, skin peeling, or any reaction on the inside of your mouth or nose? Unknown Did you need to seek medical attention at a hospital or doctor's office? Unknown When did it last happen?young child If all above answers are "NO", may proceed with cephalosporin use.   . Sulfa Antibiotics Nausea And Vomiting    Metabolic Disorder Labs: Lab Results  Component Value Date   HGBA1C 5.5 09/19/2013   No results found for: PROLACTIN Lab Results   Component Value Date   CHOL 250 (H) 11/11/2014   TRIG 55.0 11/11/2014   HDL 133.20 11/11/2014   CHOLHDL 2 11/11/2014   VLDL 11.0 11/11/2014   LDLCALC 106 (H) 11/11/2014   LDLCALC 127 (H) 09/19/2013   Lab Results  Component Value Date   TSH 1.13 09/19/2013    Therapeutic Level Labs: No results found for: LITHIUM No results found for: VALPROATE No components found for:  CBMZ  Current Medications: Current Outpatient Medications  Medication Sig Dispense Refill  . acyclovir (ZOVIRAX) 400 MG tablet Take 1 tablet (400 mg total) by mouth 5 (five) times daily. (Patient taking differently: Take 400 mg by mouth daily as needed (itching in nose and eyes). ) 60 tablet 3  . albuterol (PROAIR HFA) 108 (90 Base) MCG/ACT inhaler Inhale 2 puffs into the lungs every 6 (six) hours as needed for wheezing or shortness of breath. 3 Inhaler 3  . ALPRAZolam (XANAX) 0.25 MG tablet Take 1 tablet (0.25 mg total) by mouth 3 (three) times daily as needed. (Patient taking differently: Take 0.25 mg by mouth See admin instructions. Take one tablet (0.25 mg) by mouth daily at bedtime, take one tablet (0.25 mg) during the day as needed for anxiety/stress) 270 tablet 0  . buPROPion (WELLBUTRIN SR) 200 MG 12 hr tablet Take 1 tablet (200 mg total) by mouth 2 (two) times daily. 180 tablet 0  . clotrimazole (MYCELEX) 10 MG troche Take 1 tablet (10 mg total) by mouth 5 (five) times daily. (Patient taking differently: Take 10 mg by mouth 3 (three) times daily. ) 35 tablet 0  . diclofenac sodium (VOLTAREN) 1 % GEL Apply 1 application topically daily as needed (pain).   2  . FIBER PO Take 1 tablet by mouth 2 (two) times a day.    . furosemide (LASIX) 20 MG tablet Take 0.5 tablets (10 mg total) by mouth daily. 45 tablet 3  . ibuprofen (ADVIL,MOTRIN) 200 MG tablet Take 400 mg by mouth every 6 (six) hours as needed for headache (pain).     Marland Kitchen omeprazole (PRILOSEC) 20 MG capsule Take 20 mg by mouth daily as needed (acid reflux).      . pravastatin (PRAVACHOL) 20 MG tablet Take 20 mg by mouth daily.   1  . TRELEGY ELLIPTA 100-62.5-25 MCG/INH AEPB INHALE 1 PUFF INTO THE LUNGS DAILY (Patient taking differently: Inhale 1 puff into the lungs daily. ) 60 each 5   No current facility-administered medications for this visit.     Psychiatric Specialty Exam: Review of Systems  Constitutional: Positive for malaise/fatigue.  Respiratory: Positive for shortness of breath.   Psychiatric/Behavioral: The patient is nervous/anxious.     There were no vitals taken for this visit.There is no height or weight on file to calculate BMI.  General Appearance: NA  Eye Contact:  NA  Speech:  Clear and Coherent  Volume:  Normal  Mood:  Anxious  Affect:  NA  Thought Process:  Goal Directed  Orientation:  Full (Time, Place, and Person)  Thought Content: Logical   Suicidal Thoughts:  No  Homicidal Thoughts:  No  Memory:  Immediate;   Good Recent;   Good Remote;   Good  Judgement:  Fair  Insight:  Fair  Psychomotor Activity:  NA  Concentration:  Concentration: Good  Recall:  Good  Fund of Knowledge: Good  Language: Good  Akathisia:  Negative  Handed:  Right  AIMS (if indicated): not done  Assets:  Communication Skills Desire for Improvement Financial Resources/Insurance Housing Resilience  ADL's:  Intact  Cognition: WNL  Sleep:  Good   Screenings: PHQ2-9     PULMONARY REHAB COPD ORIENTATION from 07/23/2012 in [place]  PHQ-2 Total Score  3  PHQ-9 Total Score  12       Assessment and Plan: 63 yo divorced female with long hx of depression (dx with MDD in 1971) and occasional panic attacks who came  last month to establish regular psychiatric care and review appropriateness of her medications. Her symptom intensity suggested moderate level of depression. She has had a good response to bupropion which she has been on for 17 years. Poor prior response to SSRIs. She is prescribed alprazolam at  a low dose as needed for panic anxiety but takes it sparingly. 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 In mid May she started to feel "worked up" about corona virus, increased anxiety, tried to call mental health and eventually was brought to ED with passive SI. She said that increased anxiety triggered relapse on alcohol (drank few glasses of wine) and this in turn triggered SI. She ultimately was sned to [facility] where she spend 9 days. Very unhappy with her treatment there, could not sleep and felt that nobody was listening to her there, She was started on gabapentin 300 mg tid and feels that this was helpful but decided to wait to talk to me about it before filling her Rx (mother was also on gabapentin). Brandy Russell has not continued to drink after release from [facility], denies craving, is less anxious and denies feeling depressed or suicidal. Ex-husband visits, she has a girl friend with whom she goes on walks. Appears to have good  insight into her anxiety and states that she "did this to herself" by isolating from supportive connections and ruminating. Patient has advanced COPD which may be contributing to some of ner sx (fatugue, low motivation). She is on supplemental oxygen at night and, in combination with increased dose of bupropion her daytime fatigue has diminished. We have continued alprazolam 0.25 mg prn anxiety - she uses it sparingly.   Plan: Continue Wellbutrin SR 200 mg bid (2nd dose at 3 PM), alprazolam 0.25 mg prn anxiety and restart gabapentin 300 mg tid. We hopefully can discontinue alprazolam next and possibly decrease dose of Wellbutrin to 150 mg bid. Next visit in 5 weeks or prn.  Stephanie Acre, MD 08/28/2018, 10:53 AM

## 2018-08-29 ENCOUNTER — Telehealth (HOSPITAL_COMMUNITY): Payer: Self-pay

## 2018-08-29 NOTE — Telephone Encounter (Signed)
Instructions left on the patient's answering machine. Asked to call back with any questions. S.Gabrien Mentink EMTP 

## 2018-09-03 ENCOUNTER — Encounter (HOSPITAL_COMMUNITY): Payer: Medicare Other

## 2018-09-03 ENCOUNTER — Telehealth (HOSPITAL_COMMUNITY): Payer: Self-pay | Admitting: *Deleted

## 2018-09-03 NOTE — Telephone Encounter (Signed)
Patient was a no show for her Nuclear Study. Patient called, left message for patient to call back to reschedule.  Brandy Russell

## 2018-09-11 ENCOUNTER — Telehealth (HOSPITAL_COMMUNITY): Payer: Self-pay | Admitting: *Deleted

## 2018-09-11 NOTE — Telephone Encounter (Signed)
Patient given detailed instructions per Myocardial Perfusion Study Information Sheet for the test on 09/13/18 at 11:00. Patient notified to arrive 15 minutes early and that it is imperative to arrive on time for appointment to keep from having the test rescheduled.  If you need to cancel or reschedule your appointment, please call the office within 24 hours of your appointment. . Patient verbalized understanding.Brandy Russell

## 2018-09-13 ENCOUNTER — Other Ambulatory Visit: Payer: Self-pay

## 2018-09-13 ENCOUNTER — Ambulatory Visit (HOSPITAL_COMMUNITY): Payer: Medicare Other | Attending: Cardiology

## 2018-09-13 DIAGNOSIS — R079 Chest pain, unspecified: Secondary | ICD-10-CM | POA: Diagnosis not present

## 2018-09-13 DIAGNOSIS — R0600 Dyspnea, unspecified: Secondary | ICD-10-CM | POA: Diagnosis not present

## 2018-09-13 LAB — MYOCARDIAL PERFUSION IMAGING
LV dias vol: 65 mL (ref 46–106)
LV sys vol: 19 mL
Peak HR: 108 {beats}/min
Rest HR: 79 {beats}/min
SDS: 4
SRS: 3
SSS: 8
TID: 0.91

## 2018-09-13 MED ORDER — REGADENOSON 0.4 MG/5ML IV SOLN
0.4000 mg | Freq: Once | INTRAVENOUS | Status: AC
  Administered 2018-09-13: 0.4 mg via INTRAVENOUS

## 2018-09-13 MED ORDER — TECHNETIUM TC 99M TETROFOSMIN IV KIT
30.2000 | PACK | Freq: Once | INTRAVENOUS | Status: AC | PRN
  Administered 2018-09-13: 30.2 via INTRAVENOUS
  Filled 2018-09-13: qty 31

## 2018-09-13 MED ORDER — TECHNETIUM TC 99M TETROFOSMIN IV KIT
10.6000 | PACK | Freq: Once | INTRAVENOUS | Status: AC | PRN
  Administered 2018-09-13: 10.6 via INTRAVENOUS
  Filled 2018-09-13: qty 11

## 2018-09-15 ENCOUNTER — Telehealth: Payer: Self-pay | Admitting: Pulmonary Disease

## 2018-09-15 NOTE — Telephone Encounter (Signed)
Patient called answering service for greenish phlegm and fatigue  Denies fever, no chills, has no other symptoms  Encouraged to monitor symptoms Denies presence of fever or chills, if increasing shortness of breath may consider treating Consider calling the office during the week if symptoms persist or worsen

## 2018-09-24 ENCOUNTER — Telehealth: Payer: Self-pay | Admitting: Acute Care

## 2018-09-24 DIAGNOSIS — Z122 Encounter for screening for malignant neoplasm of respiratory organs: Secondary | ICD-10-CM

## 2018-09-24 DIAGNOSIS — Z87891 Personal history of nicotine dependence: Secondary | ICD-10-CM

## 2018-09-24 NOTE — Telephone Encounter (Signed)
Please call patient and let them  know their  low dose Ct was read as a Lung RADS 2: nodules that are benign in appearance and behavior with a very low likelihood of becoming a clinically active cancer due to size or lack of growth. Recommendation per radiology is for a repeat LDCT in 12 months..Please let them  know we will order and schedule their  annual screening scan for 08/2019. Please let them  know there was notation of CAD on their  scan.  Please remind the patient  that this is a non-gated exam therefore degree or severity of disease  cannot be determined. Please have them  follow up with their PCP regarding potential risk factor modification, dietary therapy or pharmacologic therapy if clinically indicated. Pt.  is  currently on statin therapy. Please place order for annual  screening scan for  08/2019 and fax results to PCP. Thanks so much.  I will send a message to the PCP about having the liver lesion checked with Abdominal MRI

## 2018-09-24 NOTE — Telephone Encounter (Signed)
Please note the results of Ms. Brandy Russell's CT scan for Lung Cancer Screening.From a Lung Cancer perspective, her nodule is stable and we will schedule her for a LDCT follow up in 12 months. We will place the order and schedule the scan.   Please note the documentation of a 2.8 cm low density lesion in the left lateral segment of the liver. Given the associated biliary dilatation peripheral to this lesion, a dedicated abdominal MRI with and without contrast recommended to further evaluate.  Please follow up with additional imaging as you feel is clinically indicated. We will call the results of the Lung Rads to the patient.   Please don't hesitate to contact me if I can be of any further assistance. Thanks so much.

## 2018-09-25 NOTE — Telephone Encounter (Signed)
Pt informed of CT results per Sarah Groce, NP.  PT verbalized understanding.  Copy sent to PCP.  Order placed for 1 yr f/u CT.  

## 2018-09-25 NOTE — Telephone Encounter (Signed)
LMTC x 1  

## 2018-09-25 NOTE — Addendum Note (Signed)
Addended by: Doroteo Glassman D on: 09/25/2018 10:18 AM   Modules accepted: Orders

## 2018-10-01 ENCOUNTER — Other Ambulatory Visit (HOSPITAL_COMMUNITY): Payer: Self-pay

## 2018-10-01 MED ORDER — GABAPENTIN 300 MG PO CAPS: 300.0000 mg | ORAL_CAPSULE | Freq: Three times a day (TID) | ORAL | 0 refills | Status: DC

## 2018-10-04 ENCOUNTER — Other Ambulatory Visit: Payer: Self-pay

## 2018-10-04 ENCOUNTER — Ambulatory Visit (INDEPENDENT_AMBULATORY_CARE_PROVIDER_SITE_OTHER): Payer: Medicare Other | Admitting: Psychiatry

## 2018-10-04 DIAGNOSIS — F1021 Alcohol dependence, in remission: Secondary | ICD-10-CM | POA: Diagnosis not present

## 2018-10-04 DIAGNOSIS — F3341 Major depressive disorder, recurrent, in partial remission: Secondary | ICD-10-CM

## 2018-10-04 DIAGNOSIS — F410 Panic disorder [episodic paroxysmal anxiety] without agoraphobia: Secondary | ICD-10-CM

## 2018-10-04 MED ORDER — GABAPENTIN 300 MG PO CAPS: 300.0000 mg | ORAL_CAPSULE | Freq: Three times a day (TID) | ORAL | 1 refills | Status: DC

## 2018-10-04 MED ORDER — BUPROPION HCL ER (SR) 150 MG PO TB12: 150.0000 mg | ORAL_TABLET | Freq: Two times a day (BID) | ORAL | 1 refills | Status: DC

## 2018-10-04 NOTE — Progress Notes (Signed)
[place] MD/PA/NP OP Progress Note  10/04/2018 10:44 AM Brandy Russell  MRN:  [id number] Interview was conducted by phone and I verified that I was speaking with the correct person using two identifiers. I discussed the limitations of evaluation and management by telemedicine and  the availability of in person appointments. Patient expressed understanding and agreed to proceed.  Chief Complaint: "I am doing well".  HPI: 63 yo divorced female with Brandy hx ofdepression (dx with MDD in 1971) and occasional panic attacks and alcohol use disorder (in remission). She has had a good response to bupropion which she has been on for 17 years. Poor prior response to SSRIs. She is prescribed alprazolam at a low dose as needed for panic anxiety but takes it sparingly Only one time in past month). 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 In mid May she started to feel "worked up" about corona virus, increased anxiety, tried to call mental health and eventually was brought to ED with passive SI. She said that increased anxiety triggered relapse on alcohol (drank few glasses of wine) and this in turn triggered SI. She ultimately was sned to [facility] where she spend 9 days. She was started on gabapentin 300 mg tid and feels that this has been very helpful for anxiety (mother was also on gabapentin). Stefan has not continued to drink after release from [facility], denies craving, is less anxious and denies feeling depressed or suicidal. Ex-husband visits, she has a girl friend with whom she goes on walks. Appears to have good  insight into her anxiety and states that she "did this to herself" by isolating from supportive connections and ruminating. Patient has advanced COPD which may be contributing to some of ner sx (fatugue, low motivation).She is on supplemental oxygen at night and, in combination with increased dose of bupropion her daytime fatigue has  diminished. She has difficulty swallowing 200 mg tablets of Wellbutrin and has been cutting them in half.    Visit Diagnosis:    ICD-10-CM   1. Panic disorder  F41.0   2. Major depressive disorder, recurrent episode, in partial remission (Brandy Russell)  F33.41   3. Alcohol use disorder, severe, in early remission (Brandy Russell)  F10.21     Past Psychiatric History: Please refer to intake H&P.  Past Medical History:  Past Medical History:  Diagnosis Date  . Allergy   . Anxiety and depression   . CAD (coronary artery disease)    incidental finding on CT scan for lung ca screening  . Constipation   . Depression   . Emphysema   . Emphysema of lung ([place])   . GERD (gastroesophageal reflux disease)   . Herpes    face, uses suppresive therapy intermittently  . Seasonal allergies     Past Surgical History:  Procedure Laterality Date  . COLONOSCOPY    . DILATION AND CURETTAGE OF UTERUS    . POLYPECTOMY    . TONSILLECTOMY AND ADENOIDECTOMY  1962    Family Psychiatric History: Reviewed.  Family History:  Family History  Problem Relation Age of Onset  . Alcohol abuse Mother   . Uterine cancer Mother   . Emphysema Mother   . Alcohol abuse Father   . Lung cancer Father   . Hypertension Father   . Emphysema Father   . Melanoma Brother   . Colon cancer Neg Hx   . Esophageal cancer Neg Hx   .  Rectal cancer Neg Hx   . Stomach cancer Neg Hx   . Colon polyps Neg Hx     Social History:  Social History   Socioeconomic History  . Marital status: Divorced    Spouse name: Not on file  . Number of children: 0  . Years of education: Not on file  . Highest education level: Bachelor's degree (e.g., BA, AB, BS)  Occupational History  . Occupation: artists  Social Needs  . Financial resource strain: Not on file  . Food insecurity    Worry: Not on file    Inability: Not on file  . Transportation needs    Medical: Not on file    Non-medical: Not on file  Tobacco Use  . Smoking status: Former  Smoker    Packs/day: 2.00    Years: 35.00    Pack years: 70.00    Types: Cigarettes    Quit date: 03/28/2006    Years since quitting: 12.5  . Smokeless tobacco: Never Used  Substance and Sexual Activity  . Alcohol use: No  . Drug use: No  . Sexual activity: Never  Lifestyle  . Physical activity    Days per week: Not on file    Minutes per session: Not on file  . Stress: Not on file  Relationships  . Social Herbalist on phone: Not on file    Gets together: Not on file    Attends religious service: Not on file    Active member of club or organization: Not on file    Attends meetings of clubs or organizations: Not on file    Relationship status: Not on file  Other Topics Concern  . 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 Levaquin [Levofloxacin] Other (See Comments)    tendonitis  . Penicillins Swelling    Feet swelling/ Did it involve swelling of the face/tongue/throat, SOB, or low BP? Unknown Did it involve sudden or severe rash/hives, skin peeling, or any reaction on the inside of your mouth or nose? Unknown Did you need to seek medical attention at a hospital or doctor's office? Unknown When did it last happen?young child If all above answers are "NO", may proceed with cephalosporin use.   . Sulfa Antibiotics Nausea And Vomiting    Metabolic Disorder Labs: Lab Results  Component Value Date   HGBA1C 5.5 09/19/2013   No results found for: PROLACTIN Lab Results  Component Value Date   CHOL 250 (H) 11/11/2014   TRIG 55.0 11/11/2014   HDL 133.20 11/11/2014   CHOLHDL 2 11/11/2014   VLDL 11.0 11/11/2014   LDLCALC 106 (H) 11/11/2014   LDLCALC 127 (H) 09/19/2013   Lab Results   Component Value Date   TSH 1.13 09/19/2013    Therapeutic Level Labs: No results found for: LITHIUM No results found for: VALPROATE No components found for:  CBMZ  Current Medications: Current Outpatient Medications  Medication Sig Dispense Refill  . acyclovir (ZOVIRAX) 400 MG tablet Take 1 tablet (400 mg total) by mouth 5 (five) times daily. (Patient taking differently: Take 400 mg by  mouth daily as needed (itching in nose and eyes). ) 60 tablet 3  . albuterol (PROAIR HFA) 108 (90 Base) MCG/ACT inhaler Inhale 2 puffs into the lungs every 6 (six) hours as needed for wheezing or shortness of breath. 3 Inhaler 3  . buPROPion (WELLBUTRIN SR) 150 MG 12 hr tablet Take 1 tablet (150 mg total) by mouth 2 (two) times daily. 180 tablet 1  . clotrimazole (MYCELEX) 10 MG troche Take 1 tablet (10 mg total) by mouth 5 (five) times daily. (Patient taking differently: Take 10 mg by mouth 3 (three) times daily. ) 35 tablet 0  . diclofenac sodium (VOLTAREN) 1 % GEL Apply 1 application topically daily as needed (pain).   2  . FIBER PO Take 1 tablet by mouth 2 (two) times a day.    . furosemide (LASIX) 20 MG tablet Take 0.5 tablets (10 mg total) by mouth daily. 45 tablet 3  . gabapentin (NEURONTIN) 300 MG capsule Take 1 capsule (300 mg total) by mouth 3 (three) times daily. 270 capsule 1  . ibuprofen (ADVIL,MOTRIN) 200 MG tablet Take 400 mg by mouth every 6 (six) hours as needed for headache (pain).     Marland Kitchen omeprazole (PRILOSEC) 20 MG capsule Take 20 mg by mouth daily as needed (acid reflux).     . pravastatin (PRAVACHOL) 20 MG tablet Take 20 mg by mouth daily.   1  . TRELEGY ELLIPTA 100-62.5-25 MCG/INH AEPB INHALE 1 PUFF INTO THE LUNGS DAILY (Patient taking differently: Inhale 1 puff into the lungs daily. ) 60 each 5   No current facility-administered medications for this visit.     Psychiatric Specialty Exam: Review of Systems  Psychiatric/Behavioral: The patient is nervous/anxious.   All other systems  reviewed and are negative.   There were no vitals taken for this visit.There is no height or weight on file to calculate BMI.  General Appearance: NA  Eye Contact:  NA  Speech:  Clear and Coherent and Normal Rate  Volume:  Normal  Mood:  Practically euthymic  Affect:  NA  Thought Process:  Goal Directed  Orientation:  Full (Time, Place, and Person)  Thought Content: Logical   Suicidal Thoughts:  No  Homicidal Thoughts:  No  Memory:  Immediate;   Good Recent;   Good Remote;   Good  Judgement:  Good  Insight:  Good  Psychomotor Activity:  NA  Concentration:  Concentration: Good and Attention Span: Good  Recall:  Good  Fund of Knowledge: Good  Language: Good  Akathisia:  Negative  Handed:  Right  AIMS (if indicated): not done  Assets:  Communication Skills Desire for Improvement Financial Resources/Insurance Housing Leisure Time Resilience Social Support Talents/Skills  ADL's:  Intact  Cognition: WNL  Sleep:  Good   Screenings: PHQ2-9     PULMONARY REHAB COPD ORIENTATION from 07/23/2012 in [place]  PHQ-2 Total Score  3  PHQ-9 Total Score  12       Assessment and Plan: 63 yo divorced female with Brandy hx ofdepression (dx with MDD in 1971) and occasional panic attacks and alcohol use disorder (in remission). She has had a good response to bupropion which she has been on for 17 years. Poor prior response to SSRIs. She is prescribed alprazolam at a low dose as needed for panic anxiety but takes it sparingly Only one time in past month). She has been in sustained remission of alcohol problem drinking and has worked in Hospital doctor in  the past. In mid May she started to feel "worked up" about corona virus, increased anxiety, tried to call mental health and eventually was brought to ED with passive SI. She said that increased anxiety triggered relapse on alcohol (drank few glasses of wine) and this in turn triggered SI. She ultimately was sned to  [facility] where she spend 9 days. She was started on gabapentin 300 mg tid and feels that this has been very helpful for anxiety (mother was also on gabapentin). Brandy Russell has not continued to drink after release from [facility], denies craving, is less anxious and denies feeling depressed or suicidal. Ex-husband visits, she has a girl friend with whom she goes on walks. Appears to have good  insight into her anxiety and states that she "did this to herself" by isolating from supportive connections and ruminating. Patient has advanced COPD which may be contributing to some of ner sx (fatugue, low motivation).She is on supplemental oxygen at night and, in combination with increased dose of bupropion her daytime fatigue has diminished. She has difficulty swallowing 200 mg tablets of Wellbutrin and has been cutting them in half.    Plan: Change Wellbutrin SR to 150 mg bid (2nd dose at 3 PM), continue gabapentin 300 mg tid and discontinue alprazolam 0.25 mg prn. Next visit in 3 months or prn. The plan was discussed with patient who had an opportunity to ask questions and these were all answered. I spend 25 minutes in phone consultation with the patient.     Stephanie Acre, MD 10/04/2018, 10:45 AM

## 2018-10-24 ENCOUNTER — Ambulatory Visit: Payer: Medicare Other | Admitting: Pulmonary Disease

## 2018-10-24 ENCOUNTER — Other Ambulatory Visit: Payer: Self-pay

## 2018-10-24 ENCOUNTER — Encounter: Payer: Self-pay | Admitting: Pulmonary Disease

## 2018-10-24 VITALS — BP 122/78 | HR 62 | Temp 98.2°F | Ht 66.0 in | Wt 123.0 lb

## 2018-10-24 DIAGNOSIS — J432 Centrilobular emphysema: Secondary | ICD-10-CM | POA: Diagnosis not present

## 2018-10-24 DIAGNOSIS — J439 Emphysema, unspecified: Secondary | ICD-10-CM

## 2018-10-24 DIAGNOSIS — G4736 Sleep related hypoventilation in conditions classified elsewhere: Secondary | ICD-10-CM

## 2018-10-24 NOTE — Progress Notes (Signed)
[facility]  Chief Complaint  Patient presents with  . Follow-up    CT 05/29. She reports her breathing has been good but the heat has made her a little more SOB than her usual. Using trelegy daily and albuterol prn. 2L of 02 at night. Uses [employer].     Constitutional:  BP 122/78   Pulse 62   Temp 98.2 F (36.8 C) (Oral)   Ht 5\' 6"  (1.676 m)   Wt 123 lb (55.8 kg)   SpO2 98%   BMI 19.85 kg/m   Past Medical History:  Anxiety, Depression, CAD, GERD, PNA  Brief Summary:  Brandy Russell is a 63 y.o. female former smoker with COPD and emphysema.  Her mood is better compared to earlier this year.  She has been working with a psychiatrist and seems to be on a stable regimen.  She had low dose CT in May.  Lung RADS 2.  She had stress test in June than was unremarkable.  She doesn't have cough, wheeze, or sputum.  Gets winded if she does too much.  Using oxygen at night.  Not having leg swelling.  She cut fats out of her diet.  Since then, GI symptoms better.  She has been able to keep her weight up better.  She was asking if she should enroll in clinical trial for COVID 19 vaccine.  Physical Exam:   Appearance - well kempt   ENMT - no sinus tenderness, no nasal discharge, no oral exudate  Neck - no masses, trachea midline, no thyromegaly, no elevation in JVP  Respiratory - normal appearance of chest wall, normal respiratory effort w/o accessory muscle use, no dullness on percussion, no wheezing or rales  CV - s1s2 regular rate and rhythm, no murmurs, no peripheral edema, radial pulses symmetric  GI - soft, non tender  Lymph - no adenopathy noted in neck and axillary areas  MSK - normal gait  Ext - no cyanosis, clubbing, or joint inflammation noted  Skin - no rashes, lesions, or ulcers  Neuro - normal strength, oriented x 3  Psych - normal mood and affect   Assessment/Plan:   COPD with emphysema. - continue trelegy and  prn albuterol - she will get flu shot in Fall - she will need booster Pneumovax in 2021  Nocturnal hypoxemia from emphysema. - continue 2 liters oxygen at night - advised her to monitor her pulse ox readings with activities during the day, and goal SpO2 > 90%  Lung cancer screening. - she will have f/u LDCT in May 2021  Major depression. - much improved compared to last visit - f/u with Dr. Montel Culver  Coronary atherosclerosis, diastolic CHF. - stress test was unremarkable from June 2020  Bloating with irregular bowel pattern. - improved since she changed her diet - she will d/w PCP about whether additional imaging of her liver is needed   Patient Instructions  Follow up in 6 months    Chesley Mires, MD [place] Pager: [phone] 10/24/2018, 9:57 AM  Flow Sheet     Pulmonary tests:  Spirometry 05/30/12 >> FEV1 0.69 (26%), FEV1% 40 A1AT 05/30/12 >> 112, PI*MS ONO with RA 07/03/12 >>Test time 8 hrs 33 min. Mean SpO2 92.6%, low SpO2 87%. Spent 8 min with SpO2 <88%. Sputum 07/23/15 >> Pseudomonas aeruginosa ONO with RA 03/13/18 >> test time 9 hrs 45 min.  Baseline SpO2 88%, low SpO2 81%.  Spent 5 hrs 57 min with SpO2 <  88%. ONO with RA 05/09/18 >> test time 10 hrs 1 min.  Basal SpO2 89.8%, low SpO2 65%.  Spent 202.8 min with SpO2 < 88%. ONO with 2 liters 06/12/18 >> test time 8 hrs 6 min.  Basal SpO2 97.5%, SpO2 low 92%.  Chest imaging:  CT chest 04/04/09 >> apical centrilobular emphysema, 3 mm RUL nodule, 4 mm RUL nodule Low dose CT chest 10/03/14 >> multiple pulmonary nodules up to 5 mm, mild centrilobular and paraseptal emphysema Low dose CT chest 10/06/15 >> atherosclerosis, mod centrilobular emphysema, bronchial wall thickening, scattered nodules up to 3.6 mm Esophagram 11/25/15 >> no specific abnormality Low dose CT chest 10/06/16 >> no change Low dose CT chest 03/23/18 >> atherosclerosis, multiple nodules with new nodule 7.7 mm RUL, centrilobular  and paraseptal emphysema, 3 cm lesion in left lobe of liver, possible dilated bile duct LDCT chest 08/24/18 >> atherosclerosis, numerous tiny b/l nodules up to 7.7 mm, centrilobular emphysema, 2.8 cm density in liver, 5 mm stone in Lt kidney  Cardiac tests:  Echo 02/08/05 >> EF 40 to 50% Echo 03/30/18 >> EF 55 to 60%, grade 2 DD  Medications:   Allergies as of 10/24/2018      Reactions   Levaquin [levofloxacin] Other (See Comments)   tendonitis   Penicillins Swelling   Feet swelling/ Did it involve swelling of the face/tongue/throat, SOB, or low BP? Unknown Did it involve sudden or severe rash/hives, skin peeling, or any reaction on the inside of your mouth or nose? Unknown Did you need to seek medical attention at a hospital or doctor's office? Unknown When did it last happen?young child If all above answers are "NO", may proceed with cephalosporin use.   Sulfa Antibiotics Nausea And Vomiting      Medication List       Accurate as of October 24, 2018  9:57 AM. If you have any questions, ask your nurse or doctor.        acyclovir 400 MG tablet Commonly known as: ZOVIRAX Take 1 tablet (400 mg total) by mouth 5 (five) times daily. What changed:   when to take this  reasons to take this   albuterol 108 (90 Base) MCG/ACT inhaler Commonly known as: ProAir HFA Inhale 2 puffs into the lungs every 6 (six) hours as needed for wheezing or shortness of breath.   buPROPion 150 MG 12 hr tablet Commonly known as: WELLBUTRIN SR Take 1 tablet (150 mg total) by mouth 2 (two) times daily.   clotrimazole 10 MG troche Commonly known as: MYCELEX Take 1 tablet (10 mg total) by mouth 5 (five) times daily. What changed: when to take this   diclofenac sodium 1 % Gel Commonly known as: VOLTAREN Apply 1 application topically daily as needed (pain).   FIBER PO Take 1 tablet by mouth 2 (two) times a day.   furosemide 20 MG tablet Commonly known as: LASIX Take 0.5 tablets (10 mg total)  by mouth daily.   gabapentin 300 MG capsule Commonly known as: NEURONTIN Take 1 capsule (300 mg total) by mouth 3 (three) times daily.   ibuprofen 200 MG tablet Commonly known as: ADVIL Take 400 mg by mouth every 6 (six) hours as needed for headache (pain).   omeprazole 20 MG capsule Commonly known as: PRILOSEC Take 20 mg by mouth daily as needed (acid reflux).   pravastatin 20 MG tablet Commonly known as: PRAVACHOL Take 20 mg by mouth daily.   Trelegy Ellipta 100-62.5-25 MCG/INH Aepb Generic drug: Fluticasone-Umeclidin-Vilant INHALE  1 PUFF INTO THE LUNGS DAILY What changed: See the new instructions.       Past Surgical History:  She  has a past surgical history that includes Tonsillectomy and adenoidectomy (1962); Dilation and curettage of uterus; Colonoscopy; and Polypectomy.  Family History:  Her family history includes Alcohol abuse in her father and mother; Emphysema in her father and mother; Hypertension in her father; Lung cancer in her father; Melanoma in her brother; Uterine cancer in her mother.  Social History:  She  reports that she quit smoking about 12 years ago. Her smoking use included cigarettes. She has a 70.00 pack-year smoking history. She has never used smokeless tobacco. She reports that she does not drink alcohol or use drugs.

## 2018-10-24 NOTE — Patient Instructions (Signed)
Follow up in 6 months 

## 2018-12-20 ENCOUNTER — Other Ambulatory Visit: Payer: Self-pay | Admitting: General Surgery

## 2018-12-20 DIAGNOSIS — J432 Centrilobular emphysema: Secondary | ICD-10-CM

## 2018-12-20 MED ORDER — TRELEGY ELLIPTA 100-62.5-25 MCG/INH IN AEPB: 1.0000 | INHALATION_SPRAY | Freq: Every day | RESPIRATORY_TRACT | 5 refills | Status: DC

## 2019-01-04 ENCOUNTER — Other Ambulatory Visit: Payer: Self-pay

## 2019-01-04 ENCOUNTER — Ambulatory Visit (INDEPENDENT_AMBULATORY_CARE_PROVIDER_SITE_OTHER): Payer: Medicare Other | Admitting: Psychiatry

## 2019-01-04 DIAGNOSIS — F1021 Alcohol dependence, in remission: Secondary | ICD-10-CM | POA: Diagnosis not present

## 2019-01-04 DIAGNOSIS — F3341 Major depressive disorder, recurrent, in partial remission: Secondary | ICD-10-CM

## 2019-01-04 DIAGNOSIS — F410 Panic disorder [episodic paroxysmal anxiety] without agoraphobia: Secondary | ICD-10-CM | POA: Diagnosis not present

## 2019-01-04 MED ORDER — ALPRAZOLAM 0.25 MG PO TABS: 0.2500 mg | ORAL_TABLET | Freq: Every day | ORAL | 1 refills | Status: AC | PRN

## 2019-01-04 NOTE — Progress Notes (Signed)
BH MD/PA/NP OP Progress Note  01/04/2019 10:46 AM ASHLYND CASOLA  MRN:  [id number] Interview was conducted by phone and I verified that I was speaking with the correct person using two identifiers. I discussed the limitations of evaluation and management by telemedicine and  the availability of in person appointments. Patient expressed understanding and agreed to proceed.  Chief Complaint: "I have good and bad days".  HPI: 63yo divorced female with long hx ofdepression (dx with MDD in 1971) and occasional panic attacks and alcohol use disorder (in remission). She has had a good response to bupropion which she has been on for 17 years. Poor prior response to SSRIs. She is prescribed alprazolam at a low dose as needed for panic anxiety but takes it sparingly Only one time in past month). Shehas beenin sustained remission of alcohol problem drinking and has worked in the field in the past.In mid May she started to feel "worked up" about corona virus, increased anxiety, tried to call mental health and eventually was brought to ED with passive SI. She said that increased anxiety triggered relapse on alcohol (drank few glasses of wine) and this in turn triggered SI. She ultimately was sned to [facility] where she spend 9 days. She was started on gabapentin 300 mg tid and feels that this has been very helpful for anxiety (mother was also on gabapentin). Brittnea has not continued to drink after release from [facility] (mid May), denies craving, is less anxious and denies feeling depressed or suicidal. Ex-husband visits, she has a girl friend with whom she goes on walks. Appears to have goodinsight into her anxiety and states that she "did this to herself" by isolating from supportive connections and ruminating. Patient has advanced COPD which may be contributing to some of her episodes of anxiety and fatigue. She is on supplemental oxygen at night and, in combination with increased dose of bupropion,  her daytime fatigue has diminished. She has sporadic panic attacks for which she takes low dose of alprazolam (0.25 mg). difficulty swallowing 200 mg tablets of Wellbutrin and has been cutting them in half.   Visit Diagnosis:    ICD-10-CM   1. Panic disorder  F41.0   2. Major depressive disorder, recurrent episode, in partial remission ([place])  F33.41   3. Alcohol use disorder, severe, in early remission ([place])  F10.21     Past Psychiatric History: Please see intake H&P.  Past Medical History:  Past Medical History:  Diagnosis Date  . Allergy   . Anxiety and depression   . CAD (coronary artery disease)    incidental finding on CT scan for lung ca screening  . Constipation   . Depression   . Emphysema   . Emphysema of lung ([place])   . GERD (gastroesophageal reflux disease)   . Herpes    face, uses suppresive therapy intermittently  . Seasonal allergies     Past Surgical History:  Procedure Laterality Date  . COLONOSCOPY    . DILATION AND CURETTAGE OF UTERUS    . POLYPECTOMY    . TONSILLECTOMY AND ADENOIDECTOMY  1962    Family Psychiatric History: Reviewed.  Family History:  Family History  Problem Relation Age of Onset  . Alcohol abuse Mother   . Uterine cancer Mother   . Emphysema Mother   . Alcohol abuse Father   . Lung cancer Father   . Hypertension Father   . Emphysema Father   . Melanoma Brother   . Colon cancer  Neg Hx   . Esophageal cancer Neg Hx   . Rectal cancer Neg Hx   . Stomach cancer Neg Hx   . Colon polyps Neg Hx     Social History:  Social History   Socioeconomic History  . Marital status: Divorced    Spouse name: Not on file  . Number of children: 0  . Years of education: Not on file  . Highest education level: Bachelor's degree (e.g., BA, AB, BS)  Occupational History  . Occupation: artists  Social Needs  . Financial resource strain: Not on file  . Food insecurity    Worry: Not on file    Inability: Not on file  . Transportation needs     Medical: Not on file    Non-medical: Not on file  Tobacco Use  . Smoking status: Former Smoker    Packs/day: 2.00    Years: 35.00    Pack years: 70.00    Types: Cigarettes    Quit date: 03/28/2006    Years since quitting: 12.7  . Smokeless tobacco: Never Used  Substance and Sexual Activity  . Alcohol use: No  . Drug use: No  . Sexual activity: Never  Lifestyle  . Physical activity    Days per week: Not on file    Minutes per session: Not on file  . Stress: Not on file  Relationships  . Social Herbalist on phone: Not on file    Gets together: Not on file    Attends religious service: Not on file    Active member of club or organization: Not on file    Attends meetings of clubs or organizations: Not on file    Relationship status: Not on file  Other Topics Concern  . 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 Levaquin [Levofloxacin] Other (See Comments)    tendonitis  . Penicillins Swelling    Feet swelling/ Did it involve swelling of the face/tongue/throat, SOB, or low BP? Unknown Did it involve sudden or severe rash/hives, skin peeling, or any reaction on the inside of your mouth or nose? Unknown Did you need to seek medical attention at a hospital or doctor's office? Unknown When did it last happen?young child If all above answers are "NO", may proceed with cephalosporin use.   . Sulfa Antibiotics Nausea And Vomiting    Metabolic Disorder Labs: Lab Results  Component Value Date   HGBA1C 5.5 09/19/2013   No results found for: PROLACTIN Lab Results  Component Value Date   CHOL 250 (H) 11/11/2014   TRIG 55.0 11/11/2014   HDL 133.20 11/11/2014   CHOLHDL 2 11/11/2014   VLDL 11.0  11/11/2014   LDLCALC 106 (H) 11/11/2014   LDLCALC 127 (H) 09/19/2013   Lab Results  Component Value Date   TSH 1.13 09/19/2013    Therapeutic Level Labs: No results found for: LITHIUM No results found for: VALPROATE No components found for:  CBMZ  Current Medications: Current Outpatient Medications  Medication Sig Dispense Refill  . acyclovir (ZOVIRAX) 400 MG tablet Take 1 tablet (400 mg total) by  mouth 5 (five) times daily. (Patient taking differently: Take 400 mg by mouth daily as needed (itching in nose and eyes). ) 60 tablet 3  . albuterol (PROAIR HFA) 108 (90 Base) MCG/ACT inhaler Inhale 2 puffs into the lungs every 6 (six) hours as needed for wheezing or shortness of breath. 3 Inhaler 3  . ALPRAZolam (XANAX) 0.25 MG tablet Take 1 tablet (0.25 mg total) by mouth daily as needed for anxiety. 30 tablet 1  . buPROPion (WELLBUTRIN SR) 150 MG 12 hr tablet Take 1 tablet (150 mg total) by mouth 2 (two) times daily. 180 tablet 1  . clotrimazole (MYCELEX) 10 MG troche Take 1 tablet (10 mg total) by mouth 5 (five) times daily. (Patient taking differently: Take 10 mg by mouth 3 (three) times daily. ) 35 tablet 0  . diclofenac sodium (VOLTAREN) 1 % GEL Apply 1 application topically daily as needed (pain).   2  . FIBER PO Take 1 tablet by mouth 2 (two) times a day.    . Fluticasone-Umeclidin-Vilant (TRELEGY ELLIPTA) 100-62.5-25 MCG/INH AEPB Inhale 1 puff into the lungs daily. 1 each 5  . furosemide (LASIX) 20 MG tablet Take 0.5 tablets (10 mg total) by mouth daily. 45 tablet 3  . gabapentin (NEURONTIN) 300 MG capsule Take 1 capsule (300 mg total) by mouth 3 (three) times daily. 270 capsule 1  . ibuprofen (ADVIL,MOTRIN) 200 MG tablet Take 400 mg by mouth every 6 (six) hours as needed for headache (pain).     Marland Kitchen omeprazole (PRILOSEC) 20 MG capsule Take 20 mg by mouth daily as needed (acid reflux).     . pravastatin (PRAVACHOL) 20 MG tablet Take 20 mg by mouth daily.   1   No current  facility-administered medications for this visit.      Psychiatric Specialty Exam: Review of Systems  Constitutional: Positive for malaise/fatigue.  Respiratory: Positive for shortness of breath.   Psychiatric/Behavioral: The patient is nervous/anxious.   All other systems reviewed and are negative.   There were no vitals taken for this visit.There is no height or weight on file to calculate BMI.  General Appearance: NA  Eye Contact:  NA  Speech:  Clear and Coherent and Normal Rate  Volume:  Normal  Mood:  Episodic anxiety attacks.  Affect:  NA  Thought Process:  Goal Directed and Linear  Orientation:  Full (Time, Place, and Person)  Thought Content: Logical   Suicidal Thoughts:  No  Homicidal Thoughts:  No  Memory:  Immediate;   Good Recent;   Good Remote;   Good  Judgement:  Good  Insight:  Good  Psychomotor Activity:  NA  Concentration:  Concentration: Good  Recall:  Good  Fund of Knowledge: Good  Language: Good  Akathisia:  Negative  Handed:  Right  AIMS (if indicated): not done  Assets:  Communication Skills Desire for Improvement Financial Resources/Insurance Housing Resilience  ADL's:  Intact  Cognition: WNL  Sleep:  Good   Screenings: PHQ2-9     PULMONARY REHAB COPD ORIENTATION from 07/23/2012 in [place]  PHQ-2 Total Score  3  PHQ-9 Total Score  12       Assessment and Plan: 63yo divorced female with long hx ofdepression (dx with MDD in 1971) and occasional panic attacks and alcohol use disorder (in remission). She has had a good response to bupropion which she has been on for 17 years. Poor prior response to SSRIs. She is prescribed alprazolam at a low dose  as needed for panic anxiety but takes it sparingly Only one time in past month). Shehas beenin sustained remission of alcohol problem drinking and has worked in the field in the past.In mid May she started to feel "worked up" about corona virus, increased  anxiety, tried to call mental health and eventually was brought to ED with passive SI. She said that increased anxiety triggered relapse on alcohol (drank few glasses of wine) and this in turn triggered SI. She ultimately was sned to [facility] where she spend 9 days. She was started on gabapentin 300 mg tid and feels that this has been very helpful for anxiety (mother was also on gabapentin). Troian has not continued to drink after release from [facility] (mid May), denies craving, is less anxious and denies feeling depressed or suicidal. Ex-husband visits, she has a girl friend with whom she goes on walks. Appears to have goodinsight into her anxiety and states that she "did this to herself" by isolating from supportive connections and ruminating. Patient has advanced COPD which may be contributing to some of her episodes of anxiety and fatigue. She is on supplemental oxygen at night and, in combination with increased dose of bupropion, her daytime fatigue has diminished. She has sporadic panic attacks for which she takes low dose of alprazolam (0.25 mg). difficulty swallowing 200 mg tablets of Wellbutrin and has been cutting them in half.    Dx: Panic disorder; MDD recurrent in remission; Alcohol use disorder in early (5 month) remission  Plan: Continue Wellbutrin SR 150 mg bid, gabapentin 300 mg tid and alprazolam 0.25 mg prn. Next visit in 3 months or prn. The plan was discussed with patient who had an opportunity to ask questions and these were all answered. I spend 25 minutes in phone consultation with the patient.     Stephanie Acre, MD 01/04/2019, 10:46 AM

## 2019-03-01 ENCOUNTER — Other Ambulatory Visit: Payer: Self-pay | Admitting: Internal Medicine

## 2019-03-01 DIAGNOSIS — Z1231 Encounter for screening mammogram for malignant neoplasm of breast: Secondary | ICD-10-CM

## 2019-03-05 ENCOUNTER — Encounter: Payer: Self-pay | Admitting: Adult Health

## 2019-03-05 ENCOUNTER — Ambulatory Visit (INDEPENDENT_AMBULATORY_CARE_PROVIDER_SITE_OTHER): Payer: Medicare Other | Admitting: Adult Health

## 2019-03-05 ENCOUNTER — Other Ambulatory Visit: Payer: Self-pay | Admitting: Adult Health

## 2019-03-05 ENCOUNTER — Other Ambulatory Visit: Payer: Self-pay

## 2019-03-05 DIAGNOSIS — J441 Chronic obstructive pulmonary disease with (acute) exacerbation: Secondary | ICD-10-CM | POA: Diagnosis not present

## 2019-03-05 DIAGNOSIS — G4736 Sleep related hypoventilation in conditions classified elsewhere: Secondary | ICD-10-CM

## 2019-03-05 DIAGNOSIS — J439 Emphysema, unspecified: Secondary | ICD-10-CM

## 2019-03-05 DIAGNOSIS — R0602 Shortness of breath: Secondary | ICD-10-CM

## 2019-03-05 MED ORDER — DOXYCYCLINE HYCLATE 100 MG PO TABS: 100.0000 mg | ORAL_TABLET | Freq: Two times a day (BID) | ORAL | 0 refills | Status: DC

## 2019-03-05 MED ORDER — ALBUTEROL SULFATE HFA 108 (90 BASE) MCG/ACT IN AERS: 2.0000 | INHALATION_SPRAY | Freq: Four times a day (QID) | RESPIRATORY_TRACT | 1 refills | Status: DC | PRN

## 2019-03-05 MED ORDER — PREDNISONE 10 MG PO TABS: ORAL_TABLET | ORAL | 0 refills | Status: DC

## 2019-03-05 NOTE — Progress Notes (Signed)
Reviewed and agree with assessment/plan.   Gaylia Kassel, MD [facility] 03/23/2016, 12:24 PM Pager:  [phone]  

## 2019-03-05 NOTE — Progress Notes (Signed)
Virtual Visit via Telephone Note  I connected with Brandy Russell on 03/05/19 at 10:00 AM EST by telephone and verified that I am speaking with the correct person using two identifiers.  Location: Patient: Home  Provider: Office    I discussed the limitations, risks, security and privacy concerns of performing an evaluation and management service by telephone and the availability of in person appointments. I also discussed with the patient that there may be a patient responsible charge related to this service. The patient expressed understanding and agreed to proceed.   History of Present Illness: 63 year old female former smoker followed for very severe COPD with emphysema and chronic respiratory failure on nocturnal oxygen at 2 L  On today's televisit is for an acute office visit.  Patient complains over the last 4 days that she has had increased cough, congestion with thick yellow mucus and shortness of breath.  She has had no fever, sore throat, loss of taste or smell, chest pain, orthopnea, calf pain or hemoptysis.  Says appetite is fair.  She is eating with no nausea vomiting or diarrhea.  She remains on oxygen 2 L at bedtime.  She is not on oxygen during the daytime.  She says occasionally she puts her oxygen on.  Has put it on for the last couple days just because she has felt more short of breath.  Oxygen levels have been ranging anywhere between 89 to 92% on room air.  She has had no known sick contacts.  Has been staying home for the most part.  And wears a mask if she has to go out for supplies.  She remains on Trelegy daily.  Has albuterol inhaler which she says she has increased use some over the last week.   Observations/Objective:  Spirometry 05/30/12 >> FEV1 0.69 (26%), FEV1% 40 A1AT 05/30/12 >> 112, PI*MS ONO with RA 07/03/12 >>Test time 8 hrs 33 min. Mean SpO2 92.6%, low SpO2 87%. Spent 8 min with SpO2 <88%. Sputum 07/23/15 >> Pseudomonas aeruginosa ONO with RA 03/13/18  >>test time 9 hrs 45 min. Baseline SpO2 88%, low SpO2 81%. Spent 5 hrs 57 min with SpO2 <88%. ONO with RA 05/09/18 >>test time 10 hrs 1 min. Basal SpO2 89.8%, low SpO2 65%. Spent 202.8 min with SpO2 <88%. ONO with 2 liters 06/12/18 >>test time 8 hrs 6 min. Basal SpO2 97.5%, SpO2 low 92%.  Chest imaging:  CT chest 04/04/09 >> apical centrilobular emphysema, 3 mm RUL nodule, 4 mm RUL nodule Low dose CT chest 10/03/14 >> multiple pulmonary nodules up to 5 mm, mild centrilobular and paraseptal emphysema Low dose CT chest 10/06/15 >> atherosclerosis, mod centrilobular emphysema, bronchial wall thickening, scattered nodules up to 3.6 mm Esophagram 11/25/15 >> no specific abnormality Low dose CT chest 10/06/16 >> no change Low dose CT chest 03/23/18 >> atherosclerosis, multiple nodules with new nodule 7.7 mm RUL, centrilobular and paraseptal emphysema, 3 cm lesion in left lobe of liver, possible dilated bile duct LDCT chest 08/24/18 >> atherosclerosis, numerous tiny b/l nodules up to 7.7 mm, centrilobular emphysema, 2.8 cm density in liver, 5 mm stone in Lt kidney  Cardiac tests:  Echo 02/08/05 >> EF 40 to 50% Echo 03/30/18 >> EF 55 to 60%, grade 2 DD  Assessment and Plan: Acute COPD exacerbation -  Will need COVID-19 testing.  Will treat with antibiotics and steroids.  Close follow-up in the next few weeks.  Chronic hypoxic respiratory failure on home oxygen.  O2 saturations are stable.  O2  saturation goal is greater than 88 to 90%.  Continue on nocturnal oxygen.   Plan  Patient Instructions  Doxycycline 100mg  Twice daily  For 1 week . Take with food .  Prednisone taper over next week.  Mucinex DM Twice daily  As needed  Cough/congestion  Oxygen 2l/m At bedtime   COVID 19 testing .  Follow up with Dr. Halford Chessman  Or Arcenio Mullaly NP in 4 weeks and As needed   Please contact office for sooner follow up if symptoms do not improve or worsen or seek emergency care      Follow Up  Instructions: Follow-up in 4 weeks and as needed Please contact office for sooner follow up if symptoms do not improve or worsen or seek emergency care     I discussed the assessment and treatment plan with the patient. The patient was provided an opportunity to ask questions and all were answered. The patient agreed with the plan and demonstrated an understanding of the instructions.   The patient was advised to call back or seek an in-person evaluation if the symptoms worsen or if the condition fails to improve as anticipated.  I provided 24 minutes of non-face-to-face time during this encounter.   Brandy Edison, NP

## 2019-03-05 NOTE — Patient Instructions (Addendum)
Doxycycline 100mg  Twice daily  For 1 week . Take with food .  Prednisone taper over next week.  Mucinex DM Twice daily  As needed  Cough/congestion  Oxygen 2l/m At bedtime   COVID 19 testing .  Continue on TRELEGY 1 puff daily . Rinse after use.  Albuterol Inhaler As needed  Wheezing .  Follow up with Dr. Halford Chessman  Or Brandy Racanelli NP in 4 weeks and As needed   Please contact office for sooner follow up if symptoms do not improve or worsen or seek emergency care       Person Under Monitoring Name: Brandy Russell  Location: [address] [place] [address]   Infection Prevention Recommendations for Individuals Confirmed to have, or Being Evaluated for, 2019 Novel Coronavirus (COVID-19) Infection Who Receive Care at Home  Individuals who are confirmed to have, or are being evaluated for, COVID-19 should follow the prevention steps below until a healthcare provider or local or state health department says they can return to normal activities.  Stay home except to get medical care You should restrict activities outside your home, except for getting medical care. Do not go to work, school, or public areas, and do not use public transportation or taxis.  Call ahead before visiting your doctor Before your medical appointment, call the healthcare provider and tell them that you have, or are being evaluated for, COVID-19 infection. This will help the healthcare provider's office take steps to keep other people from getting infected. Ask your healthcare provider to call the local or state health department.  Monitor your symptoms Seek prompt medical attention if your illness is worsening (e.g., difficulty breathing). Before going to your medical appointment, call the healthcare provider and tell them that you have, or are being evaluated for, COVID-19 infection. Ask your healthcare provider to call the local or state health department.  Wear a facemask You should wear a facemask that  covers your nose and mouth when you are in the same room with other people and when you visit a healthcare provider. People who live with or visit you should also wear a facemask while they are in the same room with you.  Separate yourself from other people in your home As much as possible, you should stay in a different room from other people in your home. Also, you should use a separate bathroom, if available.  Avoid sharing household items You should not share dishes, drinking glasses, cups, eating utensils, towels, bedding, or other items with other people in your home. After using these items, you should wash them thoroughly with soap and water.  Cover your coughs and sneezes Cover your mouth and nose with a tissue when you cough or sneeze, or you can cough or sneeze into your sleeve. Throw used tissues in a lined trash can, and immediately wash your hands with soap and water for at least 20 seconds or use an alcohol-based hand rub.  Wash your [facility] your hands often and thoroughly with soap and water for at least 20 seconds. You can use an alcohol-based hand sanitizer if soap and water are not available and if your hands are not visibly dirty. Avoid touching your eyes, nose, and mouth with unwashed hands.   Prevention Steps for Caregivers and Household Members of Individuals Confirmed to have, or Being Evaluated for, COVID-19 Infection Being Cared for in the Home  If you live with, or provide care at home for, a person confirmed to have, or being evaluated  for, COVID-19 infection please follow these guidelines to prevent infection:  Follow healthcare provider's instructions Make sure that you understand and can help the patient follow any healthcare provider instructions for all care.  Provide for the patient's basic needs You should help the patient with basic needs in the home and provide support for getting groceries, prescriptions, and other personal needs.  Monitor  the patient's symptoms If they are getting sicker, call his or her medical provider and tell them that the patient has, or is being evaluated for, COVID-19 infection. This will help the healthcare provider's office take steps to keep other people from getting infected. Ask the healthcare provider to call the local or state health department.  Limit the number of people who have contact with the patient  If possible, have only one caregiver for the patient.  Other household members should stay in another home or place of residence. If this is not possible, they should stay  in another room, or be separated from the patient as much as possible. Use a separate bathroom, if available.  Restrict visitors who do not have an essential need to be in the home.  Keep older adults, very young children, and other sick people away from the patient Keep older adults, very young children, and those who have compromised immune systems or chronic health conditions away from the patient. This includes people with chronic heart, lung, or kidney conditions, diabetes, and cancer.  Ensure good ventilation Make sure that shared spaces in the home have good air flow, such as from an air conditioner or an opened window, weather permitting.  Wash your hands often  Wash your hands often and thoroughly with soap and water for at least 20 seconds. You can use an alcohol based hand sanitizer if soap and water are not available and if your hands are not visibly dirty.  Avoid touching your eyes, nose, and mouth with unwashed hands.  Use disposable paper towels to dry your hands. If not available, use dedicated cloth towels and replace them when they become wet.  Wear a facemask and gloves  Wear a disposable facemask at all times in the room and gloves when you touch or have contact with the patient's blood, body fluids, and/or secretions or excretions, such as sweat, saliva, sputum, nasal mucus, vomit, urine, or  feces.  Ensure the mask fits over your nose and mouth tightly, and do not touch it during use.  Throw out disposable facemasks and gloves after using them. Do not reuse.  Wash your hands immediately after removing your facemask and gloves.  If your personal clothing becomes contaminated, carefully remove clothing and launder. Wash your hands after handling contaminated clothing.  Place all used disposable facemasks, gloves, and other waste in a lined container before disposing them with other household waste.  Remove gloves and wash your hands immediately after handling these items.  Do not share dishes, glasses, or other household items with the patient  Avoid sharing household items. You should not share dishes, drinking glasses, cups, eating utensils, towels, bedding, or other items with a patient who is confirmed to have, or being evaluated for, COVID-19 infection.  After the person uses these items, you should wash them thoroughly with soap and water.  Wash laundry thoroughly  Immediately remove and wash clothes or bedding that have blood, body fluids, and/or secretions or excretions, such as sweat, saliva, sputum, nasal mucus, vomit, urine, or feces, on them.  Wear gloves when handling laundry from the  patient.  Read and follow directions on labels of laundry or clothing items and detergent. In general, wash and dry with the warmest temperatures recommended on the label.  Clean all areas the individual has used often  Clean all touchable surfaces, such as counters, tabletops, doorknobs, bathroom fixtures, toilets, phones, keyboards, tablets, and bedside tables, every day. Also, clean any surfaces that may have blood, body fluids, and/or secretions or excretions on them.  Wear gloves when cleaning surfaces the patient has come in contact with.  Use a diluted bleach solution (e.g., dilute bleach with 1 part bleach and 10 parts water) or a household disinfectant with a label that  says EPA-registered for coronaviruses. To make a bleach solution at home, add 1 tablespoon of bleach to 1 quart (4 cups) of water. For a larger supply, add  cup of bleach to 1 gallon (16 cups) of water.  Read labels of cleaning products and follow recommendations provided on product labels. Labels contain instructions for safe and effective use of the cleaning product including precautions you should take when applying the product, such as wearing gloves or eye protection and making sure you have good ventilation during use of the product.  Remove gloves and wash hands immediately after cleaning.  Monitor yourself for signs and symptoms of illness Caregivers and household members are considered close contacts, should monitor their health, and will be asked to limit movement outside of the home to the extent possible. Follow the monitoring steps for close contacts listed on the symptom monitoring form.   ? If you have additional questions, contact your local health department or call the epidemiologist on call at [phone] (available 24/7). ? This guidance is subject to change. For the most up-to-date guidance from [facility], please refer to their website: YouBlogs.pl

## 2019-03-06 NOTE — Telephone Encounter (Signed)
TP - please advise. Thanks. 

## 2019-03-06 NOTE — Telephone Encounter (Signed)
So sorry to hear this . I understand .  There are some local [facility] that are doing test . You can make appointment online and go there for testing . We have had several patients do this

## 2019-03-13 MED ORDER — CLOTRIMAZOLE 10 MG MT TROC: 10.0000 mg | Freq: Every day | OROMUCOSAL | 0 refills | Status: DC

## 2019-03-13 NOTE — Telephone Encounter (Signed)
Okay sorry to hear this  Okay to Use Mycelex troches 1 five times daily for 1 week #35 , no refill. (call send to pharm)   If not improving will need to call back  Please contact office for sooner follow up if symptoms do not improve or worsen or seek emergency care

## 2019-03-13 NOTE — Telephone Encounter (Signed)
Received the following message from patient:  "Hey.   I have a bed case of oral thrush.  (forgot yogurt)  Would prefer a lozenge.  Thanks! T[facility] covid test [facility].  Result was negative. Brandy Russell"  It looks like she was seen on 03/05/2019 for a COPD exacerbation and was given doxycycline and prednisone. She has received Diflucan in the past from Dr. Halford Chessman.   [name], please advise. Thanks!

## 2019-04-01 ENCOUNTER — Ambulatory Visit (HOSPITAL_COMMUNITY): Payer: Medicare Other | Admitting: Psychiatry

## 2019-04-03 ENCOUNTER — Other Ambulatory Visit: Payer: Self-pay

## 2019-04-03 ENCOUNTER — Ambulatory Visit (INDEPENDENT_AMBULATORY_CARE_PROVIDER_SITE_OTHER): Payer: Medicare Other | Admitting: Adult Health

## 2019-04-03 ENCOUNTER — Encounter: Payer: Self-pay | Admitting: Adult Health

## 2019-04-03 DIAGNOSIS — G4736 Sleep related hypoventilation in conditions classified elsewhere: Secondary | ICD-10-CM

## 2019-04-03 DIAGNOSIS — J432 Centrilobular emphysema: Secondary | ICD-10-CM

## 2019-04-03 DIAGNOSIS — J439 Emphysema, unspecified: Secondary | ICD-10-CM

## 2019-04-03 NOTE — Progress Notes (Signed)
Reviewed and agree with assessment/plan.   Sky Primo, MD [facility] 03/23/2016, 12:24 PM Pager:  [phone]  

## 2019-04-03 NOTE — Telephone Encounter (Signed)
Tam, this pt has ov with you today and sent email with a pic of her sputum attached  Thanks

## 2019-04-03 NOTE — Progress Notes (Signed)
Virtual Visit via Telephone Note  I connected with Brandy Russell on 04/03/19 at 10:00 AM EST by telephone and verified that I am speaking with the correct person using two identifiers.  Location: Patient: Home  Provider: Office    I discussed the limitations, risks, security and privacy concerns of performing an evaluation and management service by telephone and the availability of in person appointments. I also discussed with the patient that there may be a patient responsible charge related to this service. The patient expressed understanding and agreed to proceed.   History of Present Illness: 64 year old female former smoker followed for very severe COPD with emphysema and chronic respiratory failure on nocturnal oxygen at 2 L  Today's televisit is a 1 month follow-up.  Patient was seen last visit for COPD exacerbation.  She had had increased cough congestion and shortness of breath. She was given Doxycycline and Prednisone taper. Since last office visit she is feeling better. While taking Doxycycline and Prednisone she felt much better. Says she did not take all the Doxycyline .   Says she has coughed up some thick mucus on occasion.  None over the last couple days.  She denies any fever chest pain orthopnea.  Feels that her shortness of breath is about at baseline.  She has good days and bad days.  Gets winded with activity at times.  She has not had to use her oxygen during the daytime.  She remains on oxygen 2 L at bedtime. Appetite is great. She is eating much better.   Patient Active Problem List   Diagnosis Date Noted  . Major depressive disorder, recurrent episode, in partial remission ([place]) 08/28/2018  . Alcohol use disorder, severe, in early remission ([place]) 08/28/2018  . Nocturnal hypoxemia due to emphysema ([place]) 05/24/2018  . Major depressive disorder, recurrent episode, moderate ([place]) 05/16/2018  . Panic disorder 05/16/2018  . Community acquired pneumonia 07/26/2017  .  Coronary artery disease involving native coronary artery of native heart without angina pectoris 11/11/2014  . CN (constipation) 11/11/2014  . Herpes 11/11/2014  . Ejection fraction < 50% 05/30/2012  . COPD with emphysema ([place]) 04/16/2012  . Depression 04/16/2012   Current Outpatient Medications on File Prior to Visit  Medication Sig Dispense Refill  . acyclovir (ZOVIRAX) 400 MG tablet Take 1 tablet (400 mg total) by mouth 5 (five) times daily. (Patient taking differently: Take 400 mg by mouth daily as needed (itching in nose and eyes). ) 60 tablet 3  . albuterol (PROAIR HFA) 108 (90 Base) MCG/ACT inhaler Inhale 2 puffs into the lungs every 6 (six) hours as needed for wheezing or shortness of breath. 24 g 1  . buPROPion (WELLBUTRIN SR) 150 MG 12 hr tablet Take 1 tablet (150 mg total) by mouth 2 (two) times daily. 180 tablet 1  . diclofenac sodium (VOLTAREN) 1 % GEL Apply 1 application topically daily as needed (pain).   2  . FIBER PO Take 1 tablet by mouth 2 (two) times a day.    . Fluticasone-Umeclidin-Vilant (TRELEGY ELLIPTA) 100-62.5-25 MCG/INH AEPB Inhale 1 puff into the lungs daily. 1 each 5  . furosemide (LASIX) 20 MG tablet Take 0.5 tablets (10 mg total) by mouth daily. 45 tablet 3  . gabapentin (NEURONTIN) 300 MG capsule Take 1 capsule (300 mg total) by mouth 3 (three) times daily. 270 capsule 1  . ibuprofen (ADVIL,MOTRIN) 200 MG tablet Take 400 mg by mouth every 6 (six) hours as needed for headache (pain).     Marland Kitchen  omeprazole (PRILOSEC) 20 MG capsule Take 20 mg by mouth daily as needed (acid reflux).     . pravastatin (PRAVACHOL) 20 MG tablet Take 20 mg by mouth daily.   1   No current facility-administered medications on file prior to visit.      Observations/Objective: 04/03/2019 -speaking in full sentences with no audible wheezing.  Spirometry 05/30/12 >> FEV1 0.69 (26%), FEV1% 40 A1AT 05/30/12 >> 112, PI*MS ONO with RA 07/03/12 >>Test time 8 hrs 33 min. Mean SpO2 92.6%, low  SpO2 87%. Spent 8 min with SpO2 <88%. Sputum 07/23/15 >> Pseudomonas aeruginosa ONO with RA 03/13/18 >>test time 9 hrs 45 min. Baseline SpO2 88%, low SpO2 81%. Spent 5 hrs 57 min with SpO2 <88%. ONO with RA 05/09/18 >>test time 10 hrs 1 min. Basal SpO2 89.8%, low SpO2 65%. Spent 202.8 min with SpO2 <88%. ONO with 2 liters 06/12/18 >>test time 8 hrs 6 min. Basal SpO2 97.5%, SpO2 low 92%.  CT chest 04/04/09 >> apical centrilobular emphysema, 3 mm RUL nodule, 4 mm RUL nodule Low dose CT chest 10/03/14 >> multiple pulmonary nodules up to 5 mm, mild centrilobular and paraseptal emphysema Low dose CT chest 10/06/15 >> atherosclerosis, mod centrilobular emphysema, bronchial wall thickening, scattered nodules up to 3.6 mm Esophagram 11/25/15 >> no specific abnormality Low dose CT chest 10/06/16 >> no change Low dose CT chest 03/23/18 >> atherosclerosis, multiple nodules with new nodule 7.7 mm RUL, centrilobular and paraseptal emphysema, 3 cm lesion in left lobe of liver, possible dilated bile duct LDCT chest 08/24/18 >> atherosclerosis, numerous tiny b/l nodules up to 7.7 mm, centrilobular emphysema, 2.8 cm density in liver, 5 mm stone in Lt kidney  Cardiac tests:  Echo 02/08/05 >> EF 40 to 50% Echo 03/30/18 >> EF 55 to 60%, grade 2 DD   Assessment and Plan: COPD exacerbation recently treated with antibiotics and steroids.  Now back to baseline.  Patient's continue on Mucinex DM as needed.  Activity as tolerated.  Continue on her maintenance regimen with Trelegy inhaler daily.  Chronic respiratory failure on nocturnal oxygen continue on O2 at bedtime.  Plan  Patient Instructions  Mucinex DM Twice daily  As needed  Cough/congestion  Oxygen 2l/m At bedtime   Continue on TRELEGY 1 puff daily . Rinse after use.  Albuterol Inhaler As needed  Wheezing .  Follow up with Dr. Halford Chessman  Or Persis Graffius NP in 3 months and As needed   Please contact office for sooner follow up if symptoms do not improve or  worsen or seek emergency care       Follow Up Instructions: Follow-up in 3 months and as needed   I discussed the assessment and treatment plan with the patient. The patient was provided an opportunity to ask questions and all were answered. The patient agreed with the plan and demonstrated an understanding of the instructions.   The patient was advised to call back or seek an in-person evaluation if the symptoms worsen or if the condition fails to improve as anticipated.  I provided 22 minutes of non-face-to-face time during this encounter.   [name], NP

## 2019-04-03 NOTE — Patient Instructions (Signed)
Mucinex DM Twice daily  As needed  Cough/congestion  Oxygen 2l/m At bedtime   Continue on TRELEGY 1 puff daily . Rinse after use.  Albuterol Inhaler As needed  Wheezing .  Follow up with Dr. Halford Chessman  Or Moishe Schellenberg NP in 3 months and As needed   Please contact office for sooner follow up if symptoms do not improve or worsen or seek emergency care

## 2019-04-09 ENCOUNTER — Other Ambulatory Visit (HOSPITAL_COMMUNITY): Payer: Self-pay | Admitting: *Deleted

## 2019-04-09 MED ORDER — BUPROPION HCL ER (SR) 150 MG PO TB12: 150.0000 mg | ORAL_TABLET | Freq: Two times a day (BID) | ORAL | 1 refills | Status: DC

## 2019-04-16 ENCOUNTER — Other Ambulatory Visit: Payer: Self-pay

## 2019-04-16 ENCOUNTER — Ambulatory Visit (INDEPENDENT_AMBULATORY_CARE_PROVIDER_SITE_OTHER): Payer: Medicare Other | Admitting: Psychiatry

## 2019-04-16 DIAGNOSIS — F1021 Alcohol dependence, in remission: Secondary | ICD-10-CM

## 2019-04-16 DIAGNOSIS — F410 Panic disorder [episodic paroxysmal anxiety] without agoraphobia: Secondary | ICD-10-CM | POA: Diagnosis not present

## 2019-04-16 DIAGNOSIS — F3342 Major depressive disorder, recurrent, in full remission: Secondary | ICD-10-CM

## 2019-04-16 MED ORDER — ALPRAZOLAM 0.25 MG PO TABS: 0.2500 mg | ORAL_TABLET | Freq: Every day | ORAL | 1 refills | Status: AC | PRN

## 2019-04-16 NOTE — Progress Notes (Signed)
[place] MD/PA/NP OP Progress Note  04/16/2019 10:16 AM Brandy Russell  MRN:  [id number] Interview was conducted by phone and I verified that I was speaking with the correct person using two identifiers. I discussed the limitations of evaluation and management by telemedicine and  the availability of in person appointments. Patient expressed understanding and agreed to proceed.  Chief Complaint: Episodic anxiety attacks.  HPI: 64yo divorced female with long hx ofdepression (dx with MDD in 1971) and occasional panic attacksand alcohol use disorder (in remission).She has had a good response to bupropion which she has been on for 17 years. Poor prior response to SSRIs. She is prescribed alprazolam at a low dose as needed for panic anxiety but takes it sparingly Only one time in past month). Shehas beenin sustained remission of alcohol problem drinking and has worked in the field in the past.In mid May she started to feel "worked up" about corona virus, increased anxiety, tried to call mental health and eventually was brought to ED with passive SI. She said that increased anxiety triggered relapse on alcohol (drank few glasses of wine) and this in turn triggered SI. She ultimately was send to [facility] where she spend 9 days. She was started on gabapentin 300 mg tid and feels that thishas been very helpful for anxiety(mother was also on gabapentin). Josiah has not continued to drink after release from [facility] (mid May), denies craving, is less anxious and denies feeling depressed or suicidal. Ex-husband visits, she has a girl friend with whom she goes on walks. Appears to have goodinsight into her anxiety and states that she "did this to herself" by isolating from supportive connections and ruminating. Patient has advanced COPD which may be contributing to some of her episodes of anxiety and fatigue. She is on supplemental oxygen at night and, in combination with increased dose of bupropion,  her daytime fatigue has diminished.She has sporadic panic attacks for which she takes low dose of alprazolam (0.25 mg). Depression remitted and since she has no desire (cravings) to drink alcohol and anxiety is only episodic (coincide with exacerbation of breathing problems; still has a few tablets of alprazolam from 30 prescribe 3 moths ago) she started to taper gabapentin off. She is now on 300 mg daily and would like to stop it altogether. Sleep and appetite are good.   Visit Diagnosis:    ICD-10-CM   1. Panic disorder  F41.0   2. Alcohol use disorder, severe, in early remission ([place])  F10.21   3. Major depressive disorder, recurrent episode, in full remission ([place])  F33.42     Past Psychiatric History: Please see intke H&P.  Past Medical History:  Past Medical History:  Diagnosis Date  . Allergy   . Anxiety and depression   . CAD (coronary artery disease)    incidental finding on CT scan for lung ca screening  . Constipation   . Depression   . Emphysema   . Emphysema of lung ([place])   . GERD (gastroesophageal reflux disease)   . Herpes    face, uses suppresive therapy intermittently  . Seasonal allergies     Past Surgical History:  Procedure Laterality Date  . COLONOSCOPY    . DILATION AND CURETTAGE OF UTERUS    . POLYPECTOMY    . TONSILLECTOMY AND ADENOIDECTOMY  1962    Family Psychiatric History: Reviewd.  Family History:  Family History  Problem Relation Age of Onset  . Alcohol abuse Mother   . Uterine  cancer Mother   . Emphysema Mother   . Alcohol abuse Father   . Lung cancer Father   . Hypertension Father   . Emphysema Father   . Melanoma Brother   . Colon cancer Neg Hx   . Esophageal cancer Neg Hx   . Rectal cancer Neg Hx   . Stomach cancer Neg Hx   . Colon polyps Neg Hx     Social History:  Social History   Socioeconomic History  . Marital status: Divorced    Spouse name: Not on file  . Number of children: 0  . Years of education: Not on file   . Highest education level: Bachelor's degree (e.g., BA, AB, BS)  Occupational History  . Occupation: artists  Tobacco Use  . Smoking status: Former Smoker    Packs/day: 2.00    Years: 35.00    Pack years: 70.00    Types: Cigarettes    Quit date: 03/28/2006    Years since quitting: 13.0  . Smokeless tobacco: Never Used  Substance and Sexual Activity  . Alcohol use: No  . Drug use: No  . Sexual activity: Never  Other Topics Concern  . 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 Difficulty of Paying Living Expenses: Not on file  Food Insecurity:   . Worried About Charity fundraiser in the Last Year: Not on file  . Ran Out of Food in the Last Year: Not on file  Transportation Needs:   . Lack of Transportation (Medical): Not on file  . Lack of Transportation (Non-Medical): Not on file  Physical Activity:   . Days of Exercise per Week: Not on file  . Minutes of Exercise per Session: Not on file  Stress:   . Feeling of Stress : Not on file  Social Connections:   . Frequency of Communication with Friends and Family: Not on file  . Frequency of Social Gatherings with Friends and Family: Not on file  . Attends Religious Services: Not on file  . Active Member of Clubs or Organizations: Not on file  . Attends Archivist Meetings: Not on file  . Marital Status: Not on file    Allergies:  Allergies  Allergen Reactions  . Levaquin [Levofloxacin] Other (See Comments)    tendonitis  . Penicillins Swelling    Feet swelling/ Did it involve swelling of the face/tongue/throat, SOB, or low BP? Unknown Did it involve sudden or severe rash/hives, skin peeling, or any reaction on the inside of your  mouth or nose? Unknown Did you need to seek medical attention at a hospital or doctor's office? Unknown When did it last happen?young child If all above answers are "NO", may proceed with cephalosporin use.   . Sulfa Antibiotics Nausea And Vomiting    Metabolic Disorder Labs: Lab Results  Component Value Date   HGBA1C 5.5 09/19/2013   No results found for: PROLACTIN Lab Results  Component Value Date   CHOL 250 (H) 11/11/2014   TRIG 55.0 11/11/2014   HDL 133.20 11/11/2014   CHOLHDL 2 11/11/2014  VLDL 11.0 11/11/2014   LDLCALC 106 (H) 11/11/2014   LDLCALC 127 (H) 09/19/2013   Lab Results  Component Value Date   TSH 1.13 09/19/2013    Therapeutic Level Labs: No results found for: LITHIUM No results found for: VALPROATE No components found for:  CBMZ  Current Medications: Current Outpatient Medications  Medication Sig Dispense Refill  . acyclovir (ZOVIRAX) 400 MG tablet Take 1 tablet (400 mg total) by mouth 5 (five) times daily. (Patient taking differently: Take 400 mg by mouth daily as needed (itching in nose and eyes). ) 60 tablet 3  . albuterol (PROAIR HFA) 108 (90 Base) MCG/ACT inhaler Inhale 2 puffs into the lungs every 6 (six) hours as needed for wheezing or shortness of breath. 24 g 1  . ALPRAZolam (XANAX) 0.25 MG tablet Take 1 tablet (0.25 mg total) by mouth daily as needed for anxiety. 30 tablet 1  . buPROPion (WELLBUTRIN SR) 150 MG 12 hr tablet Take 1 tablet (150 mg total) by mouth 2 (two) times daily. 180 tablet 1  . diclofenac sodium (VOLTAREN) 1 % GEL Apply 1 application topically daily as needed (pain).   2  . FIBER PO Take 1 tablet by mouth 2 (two) times a day.    . Fluticasone-Umeclidin-Vilant (TRELEGY ELLIPTA) 100-62.5-25 MCG/INH AEPB Inhale 1 puff into the lungs daily. 1 each 5  . furosemide (LASIX) 20 MG tablet Take 0.5 tablets (10 mg total) by mouth daily. 45 tablet 3  . ibuprofen (ADVIL,MOTRIN) 200 MG tablet Take 400 mg by mouth every 6 (six) hours  as needed for headache (pain).     Marland Kitchen omeprazole (PRILOSEC) 20 MG capsule Take 20 mg by mouth daily as needed (acid reflux).     . pravastatin (PRAVACHOL) 20 MG tablet Take 20 mg by mouth daily.   1   No current facility-administered medications for this visit.     Psychiatric Specialty Exam: Review of Systems  Respiratory: Positive for shortness of breath.   All other systems reviewed and are negative.   There were no vitals taken for this visit.There is no height or weight on file to calculate BMI.  General Appearance: NA  Eye Contact:  NA  Speech:  Clear and Coherent and Normal Rate  Volume:  Normal  Mood:  Euthymic  Affect:  NA  Thought Process:  Goal Directed and Linear  Orientation:  Full (Time, Place, and Person)  Thought Content: Logical   Suicidal Thoughts:  No  Homicidal Thoughts:  No  Memory:  Immediate;   Good Recent;   Good Remote;   Good  Judgement:  Good  Insight:  Good  Psychomotor Activity:  NA  Concentration:  Concentration: Good  Recall:  Good  Fund of Knowledge: Good  Language: Good  Akathisia:  Negative  Handed:  Right  AIMS (if indicated): not done  Assets:  Communication Skills Desire for Improvement Financial Resources/Insurance Housing Resilience Social Support Talents/Skills  ADL's:  Intact  Cognition: WNL  Sleep:  Good   Screenings: PHQ2-9     PULMONARY REHAB COPD ORIENTATION from 07/23/2012 in [place]  PHQ-2 Total Score  3  PHQ-9 Total Score  12       Assessment and Plan: 64yo divorced female with long hx ofdepression (dx with MDD in 1971) and occasional panic attacksand alcohol use disorder (in remission).She has had a good response to bupropion which she has been on for 17 years. Poor prior response to SSRIs. She is prescribed alprazolam  at a low dose as needed for panic anxiety but takes it sparingly Only one time in past month). Shehas beenin sustained remission of alcohol problem  drinking and has worked in the field in the past.In mid May she started to feel "worked up" about corona virus, increased anxiety, tried to call mental health and eventually was brought to ED with passive SI. She said that increased anxiety triggered relapse on alcohol (drank few glasses of wine) and this in turn triggered SI. She ultimately was send to [facility] where she spend 9 days. She was started on gabapentin 300 mg tid and feels that thishas been very helpful for anxiety(mother was also on gabapentin). Tamirra has not continued to drink after release from [facility] (mid May), denies craving, is less anxious and denies feeling depressed or suicidal. Ex-husband visits, she has a girl friend with whom she goes on walks. Appears to have goodinsight into her anxiety and states that she "did this to herself" by isolating from supportive connections and ruminating. Patient has advanced COPD which may be contributing to some of her episodes of anxiety and fatigue. She is on supplemental oxygen at night and, in combination with increased dose of bupropion, her daytime fatigue has diminished.She has sporadic panic attacks for which she takes low dose of alprazolam (0.25 mg). Depression remitted and since she has no desire (cravings) to drink alcohol and anxiety is only episodic (coincide with exacerbation of breathing problems; still has a few tablets of alprazolam from 30 prescribe 3 moths ago) she started to taper gabapentin off. She is now on 300 mg daily and would like to stop it altogether. Sleep and appetite are good.   Dx: Panic disorder; MDD recurrent in remission; Alcohol use disorder in early (5 month) remission  Plan:Continue Wellbutrin SR150mg  bid, stop gabapentin and continue alprazolam 0.25 mg prn anxiety attacks. Next visit in3 monthsor prn.The plan was discussed with patient who had an opportunity to ask questions and these were all answered. I spend25 minutes inphone  consultation with the patient.    Stephanie Acre, MD 04/16/2019, 10:16 AM

## 2019-04-23 ENCOUNTER — Ambulatory Visit
Admission: RE | Admit: 2019-04-23 | Discharge: 2019-04-23 | Disposition: A | Discharge status: Home / Self Care | Payer: Medicare Other | Source: Ambulatory Visit | Attending: Internal Medicine | Admitting: Internal Medicine

## 2019-04-23 ENCOUNTER — Other Ambulatory Visit: Payer: Self-pay

## 2019-04-23 DIAGNOSIS — Z1231 Encounter for screening mammogram for malignant neoplasm of breast: Secondary | ICD-10-CM

## 2019-05-15 NOTE — Telephone Encounter (Signed)
Left message for pt to call completed

## 2019-06-03 ENCOUNTER — Telehealth: Payer: Self-pay | Admitting: Pulmonary Disease

## 2019-06-04 NOTE — Telephone Encounter (Signed)
Nothing noted in encounter. Will sign off as error.

## 2019-06-10 ENCOUNTER — Other Ambulatory Visit: Payer: Self-pay | Admitting: Cardiovascular Disease

## 2019-06-23 ENCOUNTER — Other Ambulatory Visit: Payer: Self-pay | Admitting: Cardiovascular Disease

## 2019-07-04 ENCOUNTER — Telehealth: Payer: Self-pay | Admitting: Pulmonary Disease

## 2019-07-04 DIAGNOSIS — J432 Centrilobular emphysema: Secondary | ICD-10-CM

## 2019-07-04 MED ORDER — TRELEGY ELLIPTA 100-62.5-25 MCG/INH IN AEPB: 1.0000 | INHALATION_SPRAY | Freq: Every day | RESPIRATORY_TRACT | 5 refills | Status: DC

## 2019-07-04 NOTE — Telephone Encounter (Signed)
Called and spoke with patient.  Pharmacy verified. Refill sent in Nothing further needed at this time.

## 2019-07-15 ENCOUNTER — Other Ambulatory Visit: Payer: Medicare Other

## 2019-07-15 ENCOUNTER — Other Ambulatory Visit: Payer: Self-pay

## 2019-07-15 ENCOUNTER — Ambulatory Visit (INDEPENDENT_AMBULATORY_CARE_PROVIDER_SITE_OTHER): Payer: Medicare Other | Admitting: Psychiatry

## 2019-07-15 DIAGNOSIS — F410 Panic disorder [episodic paroxysmal anxiety] without agoraphobia: Secondary | ICD-10-CM | POA: Diagnosis not present

## 2019-07-15 DIAGNOSIS — F1021 Alcohol dependence, in remission: Secondary | ICD-10-CM | POA: Diagnosis not present

## 2019-07-15 DIAGNOSIS — F3342 Major depressive disorder, recurrent, in full remission: Secondary | ICD-10-CM | POA: Diagnosis not present

## 2019-07-15 MED ORDER — BUPROPION HCL ER (SR) 150 MG PO TB12: 150.0000 mg | ORAL_TABLET | Freq: Two times a day (BID) | ORAL | 1 refills | Status: DC

## 2019-07-15 MED ORDER — ALPRAZOLAM 0.25 MG PO TABS: 0.2500 mg | ORAL_TABLET | Freq: Every day | ORAL | 0 refills | Status: AC | PRN

## 2019-07-15 MED ORDER — GABAPENTIN 100 MG PO CAPS: 100.0000 mg | ORAL_CAPSULE | Freq: Three times a day (TID) | ORAL | 0 refills | Status: DC

## 2019-07-15 NOTE — Progress Notes (Signed)
[place] MD/PA/NP OP Progress Note  07/15/2019 10:11 AM Brandy Russell  MRN:  [id number] Interview was conducted by phone and I verified that I was speaking with the correct person using two identifiers. I discussed the limitations of evaluation and management by telemedicine and  the availability of in person appointments. Patient expressed understanding and agreed to proceed.  Chief Complaint: "I have been more anxious after stopping gabapentin".  HPI: 64yo divorced female with long hx ofdepression (dx with MDD in 1971) and occasional panic attacksand alcohol use disorder (in remission).She has had a good response to bupropion which she has been on for 17 years. Poor prior response to SSRIs. She is prescribed alprazolam at a low dose as needed for panic anxiety but takes it sparingly Only one time in past month). 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 She said that increased anxiety triggered relapse on alcohol (drank few glasses of wine) and this in turn triggered SI. She ultimately was send to [facility] where she spend 9 days. She was started on gabapentin 300 mg tid and feels that thishas been very helpful for anxiety(mother was also on gabapentin). Brandy Russell has not continued to drink after release from [facility] May), denies craving, is less anxious and denies feeling depressed or suicidal. Ex-husband visits, she has a girl friend with whom she goes on walks. Appears to have goodinsight into her anxiety and states that she "did this to herself" by isolating from supportive connections and ruminating. Patient has advanced COPD which may be contributing to some ofherepisodes of anxiety and fatigue.She is on supplemental oxygen at night and, in  combination with increased dose of bupropion,her daytime fatigue has diminished.She hassporadic panic attacks for which she takes low dose of alprazolam (0.25 mg).Depression remitted and since she has no desire (cravings) to drink alcohol and anxiety is only episodic (coincide with exacerbation of breathing problems; still has a few tablets of alprazolam from 30 prescribe 3 moths ago) she started to taper gabapentin off. When she discontinued it her anxiety increased. Sleep and appetite are good.   Visit Diagnosis:    ICD-10-CM   1. Major depressive disorder, recurrent episode, in full remission ([place])  F33.42   2. Alcohol use disorder, severe, in early remission ([place])  F10.21   3. Panic disorder  F41.0     Past Psychiatric History: Please see intake H&P.  Past Medical History:  Past Medical History:  Diagnosis Date  . Allergy   . Anxiety and depression   . CAD (coronary artery disease)    incidental finding on CT scan for lung ca screening  . Constipation   . Depression   . Emphysema   . Emphysema of lung ([place])   . GERD (gastroesophageal reflux disease)   . Herpes    face, uses suppresive therapy intermittently  . Seasonal allergies     Past Surgical History:  Procedure Laterality Date  . COLONOSCOPY    . DILATION AND CURETTAGE OF UTERUS    . POLYPECTOMY    . TONSILLECTOMY AND ADENOIDECTOMY  1962    Family Psychiatric History: Reviewed.  Family History:  Family History  Problem Relation Age of Onset  . Alcohol abuse Mother   . Uterine cancer Mother   . Emphysema Mother   .  Alcohol abuse Father   . Lung cancer Father   . Hypertension Father   . Emphysema Father   . Melanoma Brother   . Colon cancer Neg Hx   . Esophageal cancer Neg Hx   . Rectal cancer Neg Hx   . Stomach cancer Neg Hx   . Colon polyps Neg Hx     Social History:  Social History   Socioeconomic History  . Marital status: Divorced    Spouse name: Not on file  . Number of children: 0  .  Years of education: Not on file  . Highest education level: Bachelor's degree (e.g., BA, AB, BS)  Occupational History  . Occupation: artists  Tobacco Use  . Smoking status: Former Smoker    Packs/day: 2.00    Years: 35.00    Pack years: 70.00    Types: Cigarettes    Quit date: 03/28/2006    Years since quitting: 13.3  . Smokeless tobacco: Never Used  Substance and Sexual Activity  . Alcohol use: No  . Drug use: No  . Sexual activity: Never  Other Topics Concern  . 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 Difficulty of Paying Living Expenses:   Food Insecurity:   . Worried About Charity fundraiser in the Last Year:   . Arboriculturist in the Last Year:   Transportation Needs:   . Film/video editor (Medical):   Marland Kitchen Lack of Transportation (Non-Medical):   Physical Activity:   . Days of Exercise per Week:   . Minutes of Exercise per Session:   Stress:   . Feeling of Stress :   Social Connections:   . Frequency of Communication with Friends and Family:   . Frequency of Social Gatherings with Friends and Family:   . Attends Religious Services:   . Active Member of Clubs or Organizations:   . Attends Archivist Meetings:   Marland Kitchen Marital Status:     Allergies:  Allergies  Allergen Reactions  . Levaquin [Levofloxacin] Other (See Comments)    tendonitis  . Penicillins Swelling    Feet swelling/ Did it involve swelling of the face/tongue/throat, SOB, or low BP? Unknown Did it involve sudden or severe rash/hives, skin peeling, or any reaction on the inside of your mouth or nose? Unknown Did you need to seek medical attention at a hospital or doctor's office? Unknown When did it last  happen?young child If all above answers are "NO", may proceed with cephalosporin use.   . Sulfa Antibiotics Nausea And Vomiting    Metabolic Disorder Labs: Lab Results  Component Value Date   HGBA1C 5.5 09/19/2013   No results found for: PROLACTIN Lab Results  Component Value Date   CHOL 250 (H) 11/11/2014   TRIG 55.0 11/11/2014   HDL 133.20 11/11/2014   CHOLHDL 2 11/11/2014   VLDL 11.0 11/11/2014   LDLCALC 106 (H) 11/11/2014   LDLCALC 127 (H) 09/19/2013   Lab Results  Component Value Date   TSH 1.13 09/19/2013    Therapeutic Level Labs: No results found for:  LITHIUM No results found for: VALPROATE No components found for:  CBMZ  Current Medications: Current Outpatient Medications  Medication Sig Dispense Refill  . acyclovir (ZOVIRAX) 400 MG tablet Take 1 tablet (400 mg total) by mouth 5 (five) times daily. (Patient taking differently: Take 400 mg by mouth daily as needed (itching in nose and eyes). ) 60 tablet 3  . albuterol (PROAIR HFA) 108 (90 Base) MCG/ACT inhaler Inhale 2 puffs into the lungs every 6 (six) hours as needed for wheezing or shortness of breath. 24 g 1  . ALPRAZolam (XANAX) 0.25 MG tablet Take 1 tablet (0.25 mg total) by mouth daily as needed for anxiety. 90 tablet 0  . buPROPion (WELLBUTRIN SR) 150 MG 12 hr tablet Take 1 tablet (150 mg total) by mouth 2 (two) times daily. 180 tablet 1  . diclofenac sodium (VOLTAREN) 1 % GEL Apply 1 application topically daily as needed (pain).   2  . FIBER PO Take 1 tablet by mouth 2 (two) times a day.    . Fluticasone-Umeclidin-Vilant (TRELEGY ELLIPTA) 100-62.5-25 MCG/INH AEPB Inhale 1 puff into the lungs daily. 1 each 5  . furosemide (LASIX) 20 MG tablet TAKE 1 TABLET(20 MG TOTAL) BY MOUTH DAILY. PLEASE SCHEDULE OVER DUE APPOINTMENT FOR FURTHER REFILLS. 15 tablet 0  . gabapentin (NEURONTIN) 100 MG capsule Take 1 capsule (100 mg total) by mouth 3 (three) times daily. 270 capsule 0  . ibuprofen (ADVIL,MOTRIN) 200  MG tablet Take 400 mg by mouth every 6 (six) hours as needed for headache (pain).     Marland Kitchen omeprazole (PRILOSEC) 20 MG capsule Take 20 mg by mouth daily as needed (acid reflux).     . pravastatin (PRAVACHOL) 20 MG tablet Take 20 mg by mouth daily.   1   No current facility-administered medications for this visit.     Psychiatric Specialty Exam: Review of Systems  Psychiatric/Behavioral: The patient is nervous/anxious.   All other systems reviewed and are negative.   There were no vitals taken for this visit.There is no height or weight on file to calculate BMI.  General Appearance: NA  Eye Contact:  NA  Speech:  Clear and Coherent and Normal Rate  Volume:  Normal  Mood:  Anxious  Affect:  NA  Thought Process:  Linear  Orientation:  Full (Time, Place, and Person)  Thought Content: Logical   Suicidal Thoughts:  No  Homicidal Thoughts:  No  Memory:  Immediate;   Good Recent;   Good Remote;   Good  Judgement:  Good  Insight:  Good  Psychomotor Activity:  NA  Concentration:  Concentration: Good  Recall:  Good  Fund of Knowledge: Good  Language: Good  Akathisia:  Negative  Handed:  Right  AIMS (if indicated): not done  Assets:  Communication Skills Desire for Improvement Financial Resources/Insurance Housing Resilience Social Support  ADL's:  Intact  Cognition: WNL  Sleep:  Good   Screenings: PHQ2-9     PULMONARY REHAB COPD ORIENTATION from 07/23/2012 in [place]  PHQ-2 Total Score  3  PHQ-9 Total Score  12       Assessment and Plan: 64yo divorced female with long hx ofdepression (dx with MDD in 1971) and occasional panic attacksand alcohol use disorder (in remission).She has had a good response to bupropion which she has been on for 17 years. Poor prior response to SSRIs. She is prescribed alprazolam at a low dose as needed for panic anxiety but takes it sparingly only  one time in past month). Shehas beenin sustained remission  of alcohol problem drinking and has worked in the field in the past.In mid May 2020 she started to feel "worked up" about corona virus, increased anxiety, tried to call mental health and eventually was brought to ED with passive SI. She said that increased anxiety triggered relapse on alcohol (drank few glasses of wine) and this in turn triggered SI. She ultimately was send to [facility] where she spend 9 days. She was started on gabapentin 300 mg tid and feels that thishas been very helpful for anxiety(mother was also on gabapentin). Brandy Russell has not continued to drink after release from [facility] May), denies craving, is less anxious and denies feeling depressed or suicidal. Ex-husband visits, she has a girl friend with whom she goes on walks. Appears to have goodinsight into her anxiety and states that she "did this to herself" by isolating from supportive connections and ruminating. Patient has advanced COPD which may be contributing to some ofherepisodes of anxiety and fatigue.She is on supplemental oxygen at night and, in combination with increased dose of bupropion,her daytime fatigue has diminished.Depression remitted and since she has no desire (cravings) to drink alcohol and anxiety is only episodic (coincide with exacerbation of breathing problems; still has a few tablets of alprazolam from 30 prescribe 3 moths ago) she started to taper gabapentin off. When she discontinued it her anxiety increased. Sleep and appetite are good.   Dx: Panic disorder; MDD recurrent in remission; Alcohol use disorder in early (5 month) remission  Plan:ContinueWellbutrin SR150mg  bid,resume gabapentin at 100 mg tid and continue alprazolam 0.25 mg prn anxiety attacks. Next visit in3 monthsor prn.The plan was discussed with patient who had an opportunity to ask questions and these were all answered. I spend20 minutes inphone consultation with the patient.    Stephanie Acre,  MD 07/15/2019, 10:11 AM

## 2019-08-27 ENCOUNTER — Other Ambulatory Visit: Payer: Self-pay

## 2019-08-27 ENCOUNTER — Ambulatory Visit (INDEPENDENT_AMBULATORY_CARE_PROVIDER_SITE_OTHER)
Admission: RE | Admit: 2019-08-27 | Discharge: 2019-08-27 | Disposition: A | Discharge status: Home / Self Care | Payer: Medicare Other | Source: Ambulatory Visit | Attending: Acute Care | Admitting: Acute Care

## 2019-08-27 DIAGNOSIS — Z122 Encounter for screening for malignant neoplasm of respiratory organs: Secondary | ICD-10-CM

## 2019-08-27 DIAGNOSIS — Z87891 Personal history of nicotine dependence: Secondary | ICD-10-CM | POA: Diagnosis not present

## 2019-08-28 ENCOUNTER — Ambulatory Visit (INDEPENDENT_AMBULATORY_CARE_PROVIDER_SITE_OTHER): Payer: Medicare Other | Admitting: Pulmonary Disease

## 2019-08-28 ENCOUNTER — Encounter: Payer: Self-pay | Admitting: Pulmonary Disease

## 2019-08-28 VITALS — BP 120/70 | HR 88 | Temp 98.3°F | Ht 67.0 in | Wt 118.0 lb

## 2019-08-28 DIAGNOSIS — J432 Centrilobular emphysema: Secondary | ICD-10-CM | POA: Diagnosis not present

## 2019-08-28 DIAGNOSIS — R918 Other nonspecific abnormal finding of lung field: Secondary | ICD-10-CM | POA: Diagnosis not present

## 2019-08-28 DIAGNOSIS — G4736 Sleep related hypoventilation in conditions classified elsewhere: Secondary | ICD-10-CM | POA: Diagnosis not present

## 2019-08-28 DIAGNOSIS — J439 Emphysema, unspecified: Secondary | ICD-10-CM

## 2019-08-28 NOTE — Progress Notes (Signed)
Brandy Russell, Critical Care, and Sleep Medicine  Chief Complaint  Patient presents with  . Follow-up    pt states coughing at night coughing up yellow mucus    Constitutional:  BP 120/70 (BP Location: Left Arm, Cuff Size: Normal)   Pulse 88   Temp 98.3 F (36.8 C) (Oral)   Ht 5\' 7"  (1.702 m)   Wt 118 lb (53.5 kg)   SpO2 99%   BMI 18.48 kg/m   Past Medical History:  Anxiety, Depression, CAD, GERD, PNA  Brief Summary:  Brandy Russell is a 64 y.o. female former smoker with COPD and emphysema.  Subjective:   She has CT chest yesterday.  New RUL nodule.  Has cough with clear to yellow sputum.  Not having sinus congestion, sore throat, hemoptysis, wheezing, chest pain, fever.  Albuterol helps.  Physical Exam:   Appearance - well kempt   ENMT - no sinus tenderness, no oral exudate, no LAN, Mallampati 2 airway, no stridor  Respiratory - equal breath sounds bilaterally, no wheezing or rales  CV - s1s2 regular rate and rhythm, no murmurs  Ext - no clubbing, no edema  Skin - no rashes  Psych - normal mood and affect   Assessment/Plan:   COPD with emphysema. - don't think she needs ABx or prednisone at this time; she will call if her symptoms get worse - continue trelegy and prn albuterol  Nocturnal hypoxemia from emphysema. - continue 2 liters oxygen at night - goal SpO2 > 90%  Lung cancer screening. - reviewed her scan with her - she has new nodule in Rt upper lobe - will order repeat low dose CT chest w/o contrast for September 2021  Major depression. - f/u with Dr. Montel Russell  Coronary atherosclerosis, diastolic CHF. - f/u with cardiology  Bloating with irregular bowel pattern. - improved since she changed to more vegetarian diet  A total of  32 minutes spent addressing patient care issues on day of visit.   Follow up:   Patient Instructions  Call if your cough gets worse, and will then send in script for prednisone and  antibiotic  Will schedule low dose CT chest w/o contrast for September 2021 and schedule follow up after this   Signature:  Brandy Mires, MD [place] Pager: [phone] 08/28/2019, 10:28 AM  Flow Sheet     Russell tests:   Spirometry 05/30/12 >> FEV1 0.69 (26%), FEV1% 40  A1AT 05/30/12 >> 112, PI*MS  ONO with RA 07/03/12 >>Test time 8 hrs 33 min. Mean SpO2 92.6%, low SpO2 87%. Spent 8 min with SpO2 <88%.  Sputum 07/23/15 >> Pseudomonas aeruginosa  ONO with RA 03/13/18 >> test time 9 hrs 45 min.  Baseline SpO2 88%, low SpO2 81%.  Spent 5 hrs 57 min with SpO2 < 88%.  ONO with RA 05/09/18 >> test time 10 hrs 1 min.  Basal SpO2 89.8%, low SpO2 65%.  Spent 202.8 min with SpO2 < 88%.  ONO with 2 liters 06/12/18 >> test time 8 hrs 6 min.  Basal SpO2 97.5%, SpO2 low 92%.  Chest imaging:   CT chest 04/04/09 >> apical centrilobular emphysema, 3 mm RUL nodule, 4 mm RUL nodule  Low dose CT chest 10/03/14 >> multiple Russell nodules up to 5 mm, mild centrilobular and paraseptal emphysema  Low dose CT chest 10/06/15 >> atherosclerosis, mod centrilobular emphysema, bronchial wall thickening, scattered nodules up to 3.6 mm  Esophagram 11/25/15 >> no specific abnormality  Low dose CT chest 10/06/16 >>  no change  Low dose CT chest 03/23/18 >> atherosclerosis, multiple nodules with new nodule 7.7 mm RUL, centrilobular and paraseptal emphysema, 3 cm lesion in left lobe of liver, possible dilated bile duct  LDCT chest 08/24/18 >> atherosclerosis, numerous tiny b/l nodules up to 7.7 mm, centrilobular emphysema, 2.8 cm density in liver, 5 mm stone in Lt kidney  LDCT chest 08/27/19 >> new RUL nodule 7 mm, diffuse bronchial thickening, mod centrilobular and paraseptal emphysema, other nodules stable  Cardiac tests:  Echo 02/08/05 >> EF 40 to 50% Echo 03/30/18 >> EF 55 to 60%, grade 2 DD  Medications:   Allergies as of 08/28/2019      Reactions   Levaquin [levofloxacin]  Other (See Comments)   tendonitis   Penicillins Swelling   Feet swelling/ Did it involve swelling of the face/tongue/throat, SOB, or low BP? Unknown Did it involve sudden or severe rash/hives, skin peeling, or any reaction on the inside of your mouth or nose? Unknown Did you need to seek medical attention at a hospital or doctor's office? Unknown When did it last happen?young child If all above answers are "NO", may proceed with cephalosporin use.   Sulfa Antibiotics Nausea And Vomiting      Medication List       Accurate as of August 28, 2019 10:28 AM. If you have any questions, ask your nurse or doctor.        acyclovir 400 MG tablet Commonly known as: ZOVIRAX Take 1 tablet (400 mg total) by mouth 5 (five) times daily. What changed:   when to take this  reasons to take this   albuterol 108 (90 Base) MCG/ACT inhaler Commonly known as: ProAir HFA Inhale 2 puffs into the lungs every 6 (six) hours as needed for wheezing or shortness of breath.   ALPRAZolam 0.25 MG tablet Commonly known as: XANAX Take 1 tablet (0.25 mg total) by mouth daily as needed for anxiety.   buPROPion 150 MG 12 hr tablet Commonly known as: WELLBUTRIN SR Take 1 tablet (150 mg total) by mouth 2 (two) times daily.   diclofenac sodium 1 % Gel Commonly known as: VOLTAREN Apply 1 application topically daily as needed (pain).   FIBER PO Take 1 tablet by mouth 2 (two) times a day.   furosemide 20 MG tablet Commonly known as: LASIX TAKE 1 TABLET(20 MG TOTAL) BY MOUTH DAILY. PLEASE SCHEDULE OVER DUE APPOINTMENT FOR FURTHER REFILLS.   gabapentin 100 MG capsule Commonly known as: NEURONTIN Take 1 capsule (100 mg total) by mouth 3 (three) times daily.   ibuprofen 200 MG tablet Commonly known as: ADVIL Take 400 mg by mouth every 6 (six) hours as needed for headache (pain).   omeprazole 20 MG capsule Commonly known as: PRILOSEC Take 20 mg by mouth daily as needed (acid reflux).   pravastatin 40  MG tablet Commonly known as: PRAVACHOL Take 40 mg by mouth daily. What changed: Another medication with the same name was removed. Continue taking this medication, and follow the directions you see here. Changed by: Brandy Mires, MD   Trelegy Ellipta 100-62.5-25 MCG/INH Aepb Generic drug: Fluticasone-Umeclidin-Vilant Inhale 1 puff into the lungs daily.       Past Surgical History:  She  has a past surgical history that includes Tonsillectomy and adenoidectomy (1962); Dilation and curettage of uterus; Colonoscopy; and Polypectomy.  Family History:  Her family history includes Alcohol abuse in her father and mother; Emphysema in her father and mother; Hypertension in her father; Lung cancer  in her father; Melanoma in her brother; Uterine cancer in her mother.  Social History:  She  reports that she quit smoking about 13 years ago. Her smoking use included cigarettes. She has a 70.00 pack-year smoking history. She has never used smokeless tobacco. She reports that she does not drink alcohol or use drugs.

## 2019-08-28 NOTE — Patient Instructions (Signed)
Call if your cough gets worse, and will then send in script for prednisone and antibiotic  Will schedule low dose CT chest w/o contrast for September 2021 and schedule follow up after this

## 2019-08-29 NOTE — Progress Notes (Signed)
These results were discussed with the patient 08/28/2019 by Dr. Halford Chessman who saw the patient in the office. She verbalized understanding of the above and had no additional questions. 3 month Ct was placed by Dr. Halford Chessman. Langley Gauss, please make sure I get the results of this scan as it was placed by Dr. Halford Chessman.  Please fax results to PCP . Thanks so much

## 2019-08-31 ENCOUNTER — Telehealth: Payer: Self-pay | Admitting: Emergency Medicine

## 2019-08-31 NOTE — Telephone Encounter (Signed)
She calls telling me that her cough has increased over the last 2 days, light yellow mucous. No wheeze. Some low grade fevers.   Will call in pred taper and course doxycycline.   [facility] closed > will send to [place] location

## 2019-10-14 ENCOUNTER — Other Ambulatory Visit: Payer: Self-pay

## 2019-10-14 ENCOUNTER — Telehealth (INDEPENDENT_AMBULATORY_CARE_PROVIDER_SITE_OTHER): Payer: Medicare Other | Admitting: Psychiatry

## 2019-10-14 DIAGNOSIS — F3342 Major depressive disorder, recurrent, in full remission: Secondary | ICD-10-CM

## 2019-10-14 DIAGNOSIS — F1021 Alcohol dependence, in remission: Secondary | ICD-10-CM

## 2019-10-14 DIAGNOSIS — F410 Panic disorder [episodic paroxysmal anxiety] without agoraphobia: Secondary | ICD-10-CM | POA: Diagnosis not present

## 2019-10-14 MED ORDER — ALPRAZOLAM 0.25 MG PO TABS: 0.2500 mg | ORAL_TABLET | Freq: Every day | ORAL | 1 refills | Status: DC | PRN

## 2019-10-14 MED ORDER — GABAPENTIN 100 MG PO CAPS: 100.0000 mg | ORAL_CAPSULE | Freq: Three times a day (TID) | ORAL | 1 refills | Status: DC

## 2019-10-14 MED ORDER — BUPROPION HCL ER (SR) 150 MG PO TB12: 150.0000 mg | ORAL_TABLET | Freq: Two times a day (BID) | ORAL | 1 refills | Status: DC

## 2019-10-14 NOTE — Progress Notes (Signed)
BH MD/PA/NP OP Progress Note  10/14/2019 9:43 AM Sherlyn Ebbert  MRN:  [id number] Interview was conducted by phone and I verified that I was speaking with the correct person using two identifiers. I discussed the limitations of evaluation and management by telemedicine and  the availability of in person appointments. Patient expressed understanding and agreed to proceed. Patient location - home; physician - home office.  Chief Complaint: "I am doing well".  HPI: 64yo divorced female with long hx ofdepression (dx with MDD in 1971) and occasional panic attacksand alcohol use disorder.She has had a good response to bupropion which she has been on for 17 years. Poor prior response to SSRIs. She is prescribed alprazolam at a low dose as needed for panic anxiety but takes it sparingly only one time in past month). 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 She said that increased anxiety triggered relapse on alcohol (drank few glasses of wine) and this in turn triggered SI. She ultimately was send to [facility] where she spend 9 days. She was started on gabapentin 300 mg tid and feels that thishas been very helpful for anxiety(mother was also on gabapentin). Isa has not continued to drink after release from [facility] May 2020), denies craving, is less anxious and denies feeling depressed or suicidal. Ex-husband visits, she has a girl friend with whom she goes on walks. Appears to have goodinsight into her anxiety and states that she "did this to herself" by isolating from supportive connections and ruminating. Patient has advanced COPD which may be contributing to some ofherepisodes of anxiety and fatigue.She is on supplemental oxygen at night and, in  combination with increased dose of bupropion,her daytime fatigue has diminished.Depression remitted and since she has no desire (cravings) to drink alcohol and anxiety is only episodic (coincide with exacerbation of breathing problems) she started to taper gabapentin off. When she discontinued it her anxiety increased so we resumed 100 mg tid and she finds this dose to be helpful and well tolerated. Sleep and appetite are good.  Visit Diagnosis:    ICD-10-CM   1. Panic disorder  F41.0   2. Major depressive disorder, recurrent episode, in full remission ([place])  F33.42   3. Alcohol use disorder, severe, in sustained remission ([place])  F10.21     Past Psychiatric History: Please see intake H&P.  Past Medical History:  Past Medical History:  Diagnosis Date  . Allergy   . Anxiety and depression   . CAD (coronary artery disease)    incidental finding on CT scan for lung ca screening  . Constipation   . Depression   . Emphysema   . Emphysema of lung ([place])   . GERD (gastroesophageal reflux disease)   . Herpes    face, uses suppresive therapy intermittently  . Seasonal allergies     Past Surgical History:  Procedure Laterality Date  . COLONOSCOPY    . DILATION AND CURETTAGE OF UTERUS    . POLYPECTOMY    . TONSILLECTOMY AND ADENOIDECTOMY  1962    Family Psychiatric History: Reviewed.  Family History:  Family History  Problem Relation Age of Onset  . Alcohol abuse Mother   . Uterine cancer Mother   . Emphysema Mother   . Alcohol abuse Father   .  Lung cancer Father   . Hypertension Father   . Emphysema Father   . Melanoma Brother   . Colon cancer Neg Hx   . Esophageal cancer Neg Hx   . Rectal cancer Neg Hx   . Stomach cancer Neg Hx   . Colon polyps Neg Hx     Social History:  Social History   Socioeconomic History  . Marital status: Divorced    Spouse name: Not on file  . Number of children: 0  . Years of education: Not on file  . Highest education level:  Bachelor's degree (e.g., BA, AB, BS)  Occupational History  . Occupation: artists  Tobacco Use  . Smoking status: Former Smoker    Packs/day: 2.00    Years: 35.00    Pack years: 70.00    Types: Cigarettes    Quit date: 03/28/2006    Years since quitting: 13.5  . Smokeless tobacco: Never Used  Vaping Use  . Vaping Use: Never used  Substance and Sexual Activity  . Alcohol use: No  . Drug use: No  . Sexual activity: Never  Other Topics Concern  . 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 Difficulty of Paying Living Expenses:   Food Insecurity:   . Worried About Charity fundraiser in the Last Year:   . Arboriculturist in the Last Year:   Transportation Needs:   . Film/video editor (Medical):   Marland Kitchen Lack of Transportation (Non-Medical):   Physical Activity:   . Days of Exercise per Week:   . Minutes of Exercise per Session:   Stress:   . Feeling of Stress :   Social Connections:   . Frequency of Communication with Friends and Family:   . Frequency of Social Gatherings with Friends and Family:   . Attends Religious Services:   . Active Member of Clubs or Organizations:   . Attends Archivist Meetings:   Marland Kitchen Marital Status:     Allergies:  Allergies  Allergen Reactions  . Levaquin [Levofloxacin] Other (See Comments)    tendonitis  . Penicillins Swelling    Feet swelling/ Did it involve swelling of the face/tongue/throat, SOB, or low BP? Unknown Did it involve sudden or severe rash/hives, skin peeling, or any reaction on the inside of your mouth or nose? Unknown Did you need to seek medical attention at a hospital or doctor's office? Unknown When did it last happen?young  child If all above answers are "NO", may proceed with cephalosporin use.   . Sulfa Antibiotics Nausea And Vomiting    Metabolic Disorder Labs: Lab Results  Component Value Date   HGBA1C 5.5 09/19/2013   No results found for: PROLACTIN Lab Results  Component Value Date   CHOL 250 (H) 11/11/2014   TRIG 55.0 11/11/2014   HDL 133.20 11/11/2014   CHOLHDL 2 11/11/2014   VLDL 11.0 11/11/2014   LDLCALC 106 (H) 11/11/2014   LDLCALC 127 (H) 09/19/2013   Lab Results  Component Value Date   TSH 1.13 09/19/2013    Therapeutic Level Labs: No  results found for: LITHIUM No results found for: VALPROATE No components found for:  CBMZ  Current Medications: Current Outpatient Medications  Medication Sig Dispense Refill  . acyclovir (ZOVIRAX) 400 MG tablet Take 1 tablet (400 mg total) by mouth 5 (five) times daily. (Patient taking differently: Take 400 mg by mouth daily as needed (itching in nose and eyes). ) 60 tablet 3  . albuterol (PROAIR HFA) 108 (90 Base) MCG/ACT inhaler Inhale 2 puffs into the lungs every 6 (six) hours as needed for wheezing or shortness of breath. 24 g 1  . ALPRAZolam (XANAX) 0.25 MG tablet Take 1 tablet (0.25 mg total) by mouth daily as needed for anxiety. 90 tablet 1  . buPROPion (WELLBUTRIN SR) 150 MG 12 hr tablet Take 1 tablet (150 mg total) by mouth 2 (two) times daily. 180 tablet 1  . diclofenac sodium (VOLTAREN) 1 % GEL Apply 1 application topically daily as needed (pain).   2  . FIBER PO Take 1 tablet by mouth 2 (two) times a day.    . Fluticasone-Umeclidin-Vilant (TRELEGY ELLIPTA) 100-62.5-25 MCG/INH AEPB Inhale 1 puff into the lungs daily. 1 each 5  . furosemide (LASIX) 20 MG tablet TAKE 1 TABLET(20 MG TOTAL) BY MOUTH DAILY. PLEASE SCHEDULE OVER DUE APPOINTMENT FOR FURTHER REFILLS. 15 tablet 0  . gabapentin (NEURONTIN) 100 MG capsule Take 1 capsule (100 mg total) by mouth 3 (three) times daily. 270 capsule 1  . ibuprofen (ADVIL,MOTRIN) 200 MG tablet Take  400 mg by mouth every 6 (six) hours as needed for headache (pain).     Marland Kitchen omeprazole (PRILOSEC) 20 MG capsule Take 20 mg by mouth daily as needed (acid reflux).     . pravastatin (PRAVACHOL) 40 MG tablet Take 40 mg by mouth daily.     No current facility-administered medications for this visit.    Psychiatric Specialty Exam: Review of Systems  Psychiatric/Behavioral: The patient is nervous/anxious.   All other systems reviewed and are negative.   There were no vitals taken for this visit.There is no height or weight on file to calculate BMI.  General Appearance: NA  Eye Contact:  NA  Speech:  Clear and Coherent and Normal Rate  Volume:  Normal  Mood:  Euthymic  Affect:  NA  Thought Process:  Goal Directed and Linear  Orientation:  Full (Time, Place, and Person)  Thought Content: Logical   Suicidal Thoughts:  No  Homicidal Thoughts:  No  Memory:  Immediate;   Good Recent;   Good Remote;   Good  Judgement:  Good  Insight:  Good  Psychomotor Activity:  NA  Concentration:  Concentration: Good  Recall:  Good  Fund of Knowledge: Good  Language: Good  Akathisia:  Negative  Handed:  Right  AIMS (if indicated): not done  Assets:  Communication Skills Desire for Improvement Financial Resources/Insurance Housing Resilience Social Support Talents/Skills  ADL's:  Intact  Cognition: WNL  Sleep:  Good   Screenings: PHQ2-9     PULMONARY REHAB COPD ORIENTATION from 07/23/2012 in [place]  PHQ-2 Total Score 3  PHQ-9 Total Score 12       Assessment and Plan: 64yo divorced female with long hx ofdepression (dx with MDD in 1971) and occasional panic attacksand alcohol use disorder.She has had a good response to bupropion which she has been on for 17 years. Poor prior response to SSRIs. She is prescribed alprazolam at a low dose as needed for panic anxiety but takes it sparingly  only one time in past month). 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 She said that increased anxiety triggered relapse on alcohol (drank few glasses of wine) and this in turn triggered SI. She ultimately was send to [facility] where she spend 9 days. She was started on gabapentin 300 mg tid and feels that thishas been very helpful for anxiety(mother was also on gabapentin). Shine has not continued to drink after release from [facility] May 2020), denies craving, is less anxious and denies feeling depressed or suicidal. Ex-husband visits, she has a girl friend with whom she goes on walks. Appears to have goodinsight into her anxiety and states that she "did this to herself" by isolating from supportive connections and ruminating. Depression remitted and since she has no desire (cravings) to drink alcohol and anxiety is only episodic (coincide with exacerbation of breathing problems) she started to taper gabapentin off. When she discontinued it her anxiety increased so we resumed 100 mg tid and she finds this dose to be helpful and well tolerated. Sleep and appetite are good.  Dx: Panic disorder; MDD recurrent in remission; Alcohol use disorder in remission  Plan:ContinueWellbutrin SR150mg  bid,gabapentin at 100 mg tid andalprazolam 0.25 mg prnanxiety attacks. Next visit in4 monthsor prn.The plan was discussed with patient who had an opportunity to ask questions and these were all answered. I spend15 minutes inphone consultation with the patient.    Stephanie Acre, MD 10/14/2019, 9:43 AM

## 2019-12-04 ENCOUNTER — Ambulatory Visit (INDEPENDENT_AMBULATORY_CARE_PROVIDER_SITE_OTHER)
Admission: RE | Admit: 2019-12-04 | Discharge: 2019-12-04 | Disposition: A | Discharge status: Home / Self Care | Payer: Medicare Other | Source: Ambulatory Visit | Attending: Pulmonary Disease | Admitting: Pulmonary Disease

## 2019-12-04 ENCOUNTER — Other Ambulatory Visit: Payer: Self-pay

## 2019-12-04 DIAGNOSIS — R918 Other nonspecific abnormal finding of lung field: Secondary | ICD-10-CM

## 2019-12-04 DIAGNOSIS — R911 Solitary pulmonary nodule: Secondary | ICD-10-CM | POA: Diagnosis not present

## 2019-12-04 DIAGNOSIS — Z87891 Personal history of nicotine dependence: Secondary | ICD-10-CM

## 2019-12-05 ENCOUNTER — Telehealth: Payer: Self-pay | Admitting: Pulmonary Disease

## 2019-12-05 NOTE — Telephone Encounter (Signed)
Called and spoke with patient about CT result per Dr Halford Chessman. All questions answered and patient expressed full understanding. Follow up office visit made for Monday, September 20th at 1030am with NP per Dr Halford Chessman.

## 2019-12-05 NOTE — Telephone Encounter (Signed)
LDCT chest 12/04/19 >> moderate centrilobular and paraseptal emphysema, numerous b/l nodules w/o change- benign   Please let her know her CT chest is stable.  Please schedule ROV with me or NP to review in more detail and f/u on COPD therapy.

## 2019-12-13 ENCOUNTER — Other Ambulatory Visit: Payer: Self-pay | Admitting: *Deleted

## 2019-12-13 DIAGNOSIS — Z87891 Personal history of nicotine dependence: Secondary | ICD-10-CM

## 2019-12-16 ENCOUNTER — Other Ambulatory Visit: Payer: Self-pay

## 2019-12-16 ENCOUNTER — Encounter: Payer: Self-pay | Admitting: Adult Health

## 2019-12-16 ENCOUNTER — Ambulatory Visit (INDEPENDENT_AMBULATORY_CARE_PROVIDER_SITE_OTHER): Payer: Medicare Other | Admitting: Adult Health

## 2019-12-16 VITALS — BP 142/64 | HR 85 | Temp 97.3°F | Ht 67.0 in | Wt 117.2 lb

## 2019-12-16 DIAGNOSIS — Z23 Encounter for immunization: Secondary | ICD-10-CM | POA: Diagnosis not present

## 2019-12-16 DIAGNOSIS — J439 Emphysema, unspecified: Secondary | ICD-10-CM | POA: Diagnosis not present

## 2019-12-16 DIAGNOSIS — G4736 Sleep related hypoventilation in conditions classified elsewhere: Secondary | ICD-10-CM

## 2019-12-16 NOTE — Assessment & Plan Note (Signed)
Compensated on present regimen.  Patient does have some increased mucus.  No discoloration or fever.  We will treat with Mucinex advised to call back if symptoms worsen.  Recent CT with no acute process noted  Plan  Patient Instructions  Mucinex DM Twice daily  As needed  Cough/congestion  Oxygen 2l/m At bedtime   Continue on TRELEGY 1 puff daily . Rinse after use.  Albuterol Inhaler As needed  Wheezing .  Flu shot today .  Follow up with Dr. Halford Chessman  Or Ronelle Michie NP in 6 months and As needed   Please contact office for sooner follow up if symptoms do not improve or worsen or seek emergency care

## 2019-12-16 NOTE — Progress Notes (Signed)
@Patient  ID: Brandy Russell, female    DOB: 1956-03-02, 64 y.o.   MRN: [id number]  Chief Complaint  Patient presents with   Follow-up    COPD     Referring provider: Haywood Pao, MD  HPI: 64 year old female former smoker followed for COPD, lung nodule, chronic hypoxic respiratory failure on oxygen at bedtime  TEST/EVENTS :   Spirometry 05/30/12 >> FEV1 0.69 (26%), FEV1% 40  A1AT 05/30/12 >> 112, PI*MS  ONO with RA 07/03/12 >>Test time 8 hrs 33 min. Mean SpO2 92.6%, low SpO2 87%. Spent 8 min with SpO2 <88%.  Sputum 07/23/15 >> Pseudomonas aeruginosa  ONO with RA 03/13/18 >>test time 9 hrs 45 min. Baseline SpO2 88%, low SpO2 81%. Spent 5 hrs 57 min with SpO2 <88%.  ONO with RA 05/09/18 >>test time 10 hrs 1 min. Basal SpO2 89.8%, low SpO2 65%. Spent 202.8 min with SpO2 <88%.  ONO with 2 liters 06/12/18 >>test time 8 hrs 6 min. Basal SpO2 97.5%, SpO2 low 92%.  Chest imaging:   CT chest 04/04/09 >> apical centrilobular emphysema, 3 mm RUL nodule, 4 mm RUL nodule  Low dose CT chest 10/03/14 >> multiple pulmonary nodules up to 5 mm, mild centrilobular and paraseptal emphysema  Low dose CT chest 10/06/15 >> atherosclerosis, mod centrilobular emphysema, bronchial wall thickening, scattered nodules up to 3.6 mm  Esophagram 11/25/15 >> no specific abnormality  Low dose CT chest 10/06/16 >> no change  Low dose CT chest 03/23/18 >> atherosclerosis, multiple nodules with new nodule 7.7 mm RUL, centrilobular and paraseptal emphysema, 3 cm lesion in left lobe of liver, possible dilated bile duct  LDCT chest 08/24/18 >> atherosclerosis, numerous tiny b/l nodules up to 7.7 mm, centrilobular emphysema, 2.8 cm density in liver, 5 mm stone in Lt kidney  LDCT chest 08/27/19 >> new RUL nodule 7 mm, diffuse bronchial thickening, mod centrilobular and paraseptal emphysema, other nodules stable  Cardiac tests:  Echo 02/08/05 >> EF 40 to 50% Echo 03/30/18 >> EF 55 to 60%,  grade 2 DD  12/16/2019 Follow up : COPD, Lung nodule  Patient returns for a 55-month follow-up.  Patient has underlying COPD with emphysema.  Patient remains on Trelegy inhaler daily.  She says overall breathing is doing about the same, gets winded with minimal activity. Says cough has picked up over with thick mucus on/off . White mucus that is thick. No fever. Has not used mucinex.   Remains on 2l/m At bedtime  . No oxygen during daytime.    She participates in the low-dose CT screening program.  She has known lung nodules.  Most recent CT December 04, 2019 showed moderate emphysema and stable numerous bilateral nodules consistent with benign etiology.  Previous right middle lobe nodularity has resolved.  Recommendations were for a follow-up CT chest in 12 months   Allergies  Allergen Reactions   Levaquin [Levofloxacin] Other (See Comments)    tendonitis   Penicillins Swelling     Did it involve swelling of the face/tongue/throat, SOB, or low BP? Unknown Did it involve sudden or severe rash/hives, skin peeling, or any reaction on the inside of your mouth or nose? Unknown Did you need to seek medical attention at a hospital or doctor's office? Unknown When did it last happen?young child If all above answers are "NO", may proceed with cephalosporin use.    Sulfa Antibiotics Nausea And Vomiting    Immunization History  Administered Date(s) Administered   Influenza Split 12/26/2016, 01/02/2018, 11/27/2018  Influenza Whole 12/27/2011, 12/04/2012   Influenza,inj,Quad PF,6+ Mos 11/27/2014, 12/27/2015, 12/17/2018   Influenza-Unspecified 12/09/2013   PFIZER SARS-COV-2 Vaccination 06/06/2019, 06/27/2019   Pneumococcal Conjugate-13 11/17/2015   Pneumococcal Polysaccharide-23 03/28/2005, 01/09/2013   Zoster 09/26/2014    Past Medical History:  Diagnosis Date   Allergy    Anxiety and depression    CAD (coronary artery disease)    incidental finding on CT scan for  lung ca screening   Constipation    Depression    Emphysema    Emphysema of lung (HCC)    GERD (gastroesophageal reflux disease)    Herpes    face, uses suppresive therapy intermittently   Seasonal allergies     Tobacco History: Social History   Tobacco Use  Smoking Status Former Smoker   Packs/day: 2.00   Years: 35.00   Pack years: 70.00   Types: Cigarettes   Quit date: 03/28/2006   Years since quitting: 13.7  Smokeless Tobacco Never Used   Counseling given: Not Answered   Outpatient Medications Prior to Visit  Medication Sig Dispense Refill   acyclovir (ZOVIRAX) 400 MG tablet Take 1 tablet (400 mg total) by mouth 5 (five) times daily. (Patient taking differently: Take 400 mg by mouth daily as needed (itching in nose and eyes). ) 60 tablet 3   albuterol (PROAIR HFA) 108 (90 Base) MCG/ACT inhaler Inhale 2 puffs into the lungs every 6 (six) hours as needed for wheezing or shortness of breath. 24 g 1   ALPRAZolam (XANAX) 0.25 MG tablet Take 1 tablet (0.25 mg total) by mouth daily as needed for anxiety. 90 tablet 1   buPROPion (WELLBUTRIN SR) 150 MG 12 hr tablet Take 1 tablet (150 mg total) by mouth 2 (two) times daily. 180 tablet 1   diclofenac sodium (VOLTAREN) 1 % GEL Apply 1 application topically daily as needed (pain).   2   FIBER PO Take 1 tablet by mouth 2 (two) times a day.     Fluticasone-Umeclidin-Vilant (TRELEGY ELLIPTA) 100-62.5-25 MCG/INH AEPB Inhale 1 puff into the lungs daily. 1 each 5   gabapentin (NEURONTIN) 100 MG capsule Take 1 capsule (100 mg total) by mouth 3 (three) times daily. 270 capsule 1   ibuprofen (ADVIL,MOTRIN) 200 MG tablet Take 400 mg by mouth every 6 (six) hours as needed for headache (pain).      omeprazole (PRILOSEC) 20 MG capsule Take 20 mg by mouth daily as needed (acid reflux).      pravastatin (PRAVACHOL) 40 MG tablet Take 40 mg by mouth daily.     furosemide (LASIX) 20 MG tablet TAKE 1 TABLET(20 MG TOTAL) BY MOUTH  DAILY. PLEASE SCHEDULE OVER DUE APPOINTMENT FOR FURTHER REFILLS. 15 tablet 0   No facility-administered medications prior to visit.     Review of Systems:   Constitutional:   No  weight loss, night sweats,  Fevers, chills, fatigue, or  lassitude.  HEENT:   No headaches,  Difficulty swallowing,  Tooth/dental problems, or  Sore throat,                No sneezing, itching, ear ache, nasal congestion, post nasal drip,   CV:  No chest pain,  Orthopnea, PND, swelling in lower extremities, anasarca, dizziness, palpitations, syncope.   GI  No heartburn, indigestion, abdominal pain, nausea, vomiting, diarrhea, change in bowel habits, loss of appetite, bloody stools.   Resp: +shortness of breath with exertion or at rest.  ,  No coughing up of blood.  No change in  color of mucus.  No wheezing.  No chest wall deformity  Skin: no rash or lesions.  GU: no dysuria, change in color of urine, no urgency or frequency.  No flank pain, no hematuria   MS:  No joint pain or swelling.  No decreased range of motion.  No back pain.    Physical Exam  BP (!) 142/64 (BP Location: Left Arm, Cuff Size: Normal)    Pulse 85    Temp (!) 97.3 F (36.3 C) (Temporal)    Ht 5\' 7"  (1.702 m)    Wt 117 lb 3.2 oz (53.2 kg)    SpO2 97% Comment: RA   BMI 18.36 kg/m   GEN: A/Ox3; pleasant , NAD, well nourished    HEENT:  [place]/AT,   NOSE-clear, THROAT-clear, no lesions, no postnasal drip or exudate noted.   NECK:  Supple w/ fair ROM; no JVD; normal carotid impulses w/o bruits; no thyromegaly or nodules palpated; no lymphadenopathy.    RESP  Clear  P & A; w/o, wheezes/ rales/ or rhonchi. no accessory muscle use, no dullness to percussion  CARD:  RRR, no m/r/g, no peripheral edema, pulses intact, no cyanosis or clubbing.  GI:   Soft & nt; nml bowel sounds; no organomegaly or masses detected.   Musco: Warm bil, no deformities or joint swelling noted.   Neuro: alert, no focal deficits noted.    Skin: Warm, no lesions  or rashes    Lab Results:  CBC  BMET   Imaging: CT CHEST NODULE FOLLOW UP LOW DOSE W/O  Result Date: 12/04/2019 CLINICAL DATA:  64 year old female former smoker, quit 13 years ago, with 70 pack-year history of smoking, for short-term follow-up lung cancer screening EXAM: CT CHEST WITHOUT CONTRAST TECHNIQUE: Multidetector CT imaging of the chest was performed following the standard protocol without IV contrast. COMPARISON:  Low-dose lung cancer screening CT chest dated 08/27/2019 FINDINGS: Cardiovascular: The heart is normal in size. No pericardial effusion. No evidence of thoracic aortic aneurysm. Mild atherosclerotic calcifications of the aortic arch. Mild coronary atherosclerosis the LAD and right coronary artery. Mediastinum/Nodes: No suspicious mediastinal lymphadenopathy. Visualized thyroid is unremarkable. Lungs/Pleura: Biapical pleural-parenchymal scarring. Moderate centrilobular and paraseptal emphysematous changes, upper lung predominant. No focal consolidation. Numerous bilateral pulmonary nodules, including a dominant 7.5 mm nodule in the right upper lobe (image 88), unchanged, benign. Additional peribronchovascular nodularity in the right middle lobe has resolved, likely infectious inflammatory. Dominant sub solid nodule in the right lung apex is resolved, likely infectious inflammatory. No new/suspicious pulmonary nodules. No pleural effusion or pneumothorax. Upper Abdomen: Visualized upper abdomen is notable for a 2.7 cm central left hepatic lobe cyst and a 4 mm nonobstructing left upper pole renal calculus. Musculoskeletal: Visualized osseous structures are within normal limits. IMPRESSION: Lung-RADS 2, benign appearance or behavior. Continue annual screening with low-dose chest CT without contrast in 12 months. Aortic Atherosclerosis (ICD10-I70.0) and Emphysema (ICD10-J43.9). Electronically Signed   By: Julian Hy M.D.   On: 12/04/2019 13:58      No flowsheet data  found.  No results found for: NITRICOXIDE      Assessment & Plan:   COPD with emphysema Compensated on present regimen.  Patient does have some increased mucus.  No discoloration or fever.  We will treat with Mucinex advised to call back if symptoms worsen.  Recent CT with no acute process noted  Plan  Patient Instructions  Mucinex DM Twice daily  As needed  Cough/congestion  Oxygen 2l/m At bedtime   Continue  on TRELEGY 1 puff daily . Rinse after use.  Albuterol Inhaler As needed  Wheezing .  Flu shot today .  Follow up with Dr. Halford Chessman  Or Shamel Galyean NP in 6 months and As needed   Please contact office for sooner follow up if symptoms do not improve or worsen or seek emergency care       Nocturnal hypoxemia due to emphysema [facility]) Continue on oxygen 2 L at bedtime no changes     [name], NP 12/16/2019

## 2019-12-16 NOTE — Assessment & Plan Note (Signed)
Continue on oxygen 2 L at bedtime no changes

## 2019-12-16 NOTE — Patient Instructions (Addendum)
Mucinex DM Twice daily  As needed  Cough/congestion  Oxygen 2l/m At bedtime   Continue on TRELEGY 1 puff daily . Rinse after use.  Albuterol Inhaler As needed  Wheezing .  Flu shot today .  Follow up with Dr. Halford Chessman  Or Pegah Segel NP in 6 months and As needed   Please contact office for sooner follow up if symptoms do not improve or worsen or seek emergency care

## 2019-12-18 NOTE — Progress Notes (Signed)
Reviewed and agree with assessment/plan.   Chesley Mires, MD [facility]/Critical Care 12/18/2019, 9:10 AM Pager:  [phone]

## 2019-12-18 NOTE — Telephone Encounter (Signed)
Please advise on pt email:  I forgot to ask when I saw you on Sept. 20, 2021.... my  handicap parking permit expired a month ago.  I would like to have a prescription for the next one.  I can pick it up anytime. (I was unsure if I should address this to [place] or [place])         Thanks, Brandy Russell

## 2019-12-31 NOTE — Telephone Encounter (Signed)
Please let her know that form is completed and she can come pick it up or have it mailed to her.

## 2020-01-08 ENCOUNTER — Telehealth: Payer: Self-pay | Admitting: Pulmonary Disease

## 2020-01-08 DIAGNOSIS — J432 Centrilobular emphysema: Secondary | ICD-10-CM

## 2020-01-08 MED ORDER — TRELEGY ELLIPTA 100-62.5-25 MCG/INH IN AEPB: 1.0000 | INHALATION_SPRAY | Freq: Every day | RESPIRATORY_TRACT | 11 refills | Status: AC

## 2020-01-08 NOTE — Telephone Encounter (Signed)
Spoke with the pt  She is needing rx for trelegy  Rx was sent  Nothing further needed

## 2020-01-14 ENCOUNTER — Other Ambulatory Visit (HOSPITAL_COMMUNITY): Payer: Self-pay | Admitting: Psychiatry

## 2020-01-14 ENCOUNTER — Telehealth (HOSPITAL_COMMUNITY): Payer: Self-pay | Admitting: *Deleted

## 2020-01-14 MED ORDER — GABAPENTIN 100 MG PO CAPS: 100.0000 mg | ORAL_CAPSULE | Freq: Three times a day (TID) | ORAL | 1 refills | Status: DC

## 2020-01-14 NOTE — Telephone Encounter (Signed)
I reordered it with today's date.

## 2020-01-14 NOTE — Telephone Encounter (Signed)
Pt called stating that she "spilled my Gabapentin on a filthy dirty floor" and is asking to refill early. Pt says out of pocket cost is "exorbitant". Says insurance will pay for refill? Attempted to call pharmacy to see if insurance will pay but was unable to get through. Please review and advise.

## 2020-02-13 ENCOUNTER — Telehealth (INDEPENDENT_AMBULATORY_CARE_PROVIDER_SITE_OTHER): Payer: Medicare Other | Admitting: Psychiatry

## 2020-02-13 ENCOUNTER — Other Ambulatory Visit: Payer: Self-pay

## 2020-02-13 DIAGNOSIS — F3342 Major depressive disorder, recurrent, in full remission: Secondary | ICD-10-CM

## 2020-02-13 DIAGNOSIS — F1021 Alcohol dependence, in remission: Secondary | ICD-10-CM | POA: Diagnosis not present

## 2020-02-13 DIAGNOSIS — F410 Panic disorder [episodic paroxysmal anxiety] without agoraphobia: Secondary | ICD-10-CM | POA: Diagnosis not present

## 2020-02-13 MED ORDER — ALPRAZOLAM 0.25 MG PO TABS: 0.2500 mg | ORAL_TABLET | Freq: Every day | ORAL | 1 refills | Status: AC | PRN

## 2020-02-13 MED ORDER — BUPROPION HCL ER (SR) 150 MG PO TB12: 150.0000 mg | ORAL_TABLET | Freq: Two times a day (BID) | ORAL | 1 refills | Status: AC

## 2020-02-13 NOTE — Progress Notes (Signed)
[facility] MD/PA/NP OP Progress Note  02/13/2020 9:45 AM Brandy Russell  MRN:  [id number] Interview was conducted by phone and I verified that I was speaking with the correct person using two identifiers. I discussed the limitations of evaluation and management by telemedicine and  the availability of in person appointments. Patient expressed understanding and agreed to proceed. Patient location - home; physician - home office.  Chief Complaint: "I am doing well".  HPI: 64yo divorced female with long hx ofdepression (dx with MDD in 1971) and occasional panic attacksand alcohol use disorder.She has had a good response to bupropion which she has been on for 17 years. Poor prior response to SSRIs. She is prescribed alprazolam at a low dose as needed for panic anxiety but takes it sparinglyonly one time in past month). Shehas beenin sustained remission of alcohol problem drinking and has worked in the field in the past.In mid [id number] started to feel "worked up" about corona virus, increased anxiety, tried to call mental health and eventually was brought to ED with passive SI. She said that increased anxiety triggered relapse on alcohol (drank few glasses of wine) and this in turn triggered SI. She ultimately was send to [facility] where she spend 9 days. She was started on gabapentin 300 mg tid and feels that thishas been very helpful for anxiety(mother was also on gabapentin). [name] has not continued to drink after release from [facility] May 2020), denies craving, is less anxious and denies feeling depressed or suicidal. Ex-husband visits, she has a girl friend with whom she goes on walks. She has goodinsight into her anxiety and states that she "did this to herself" by isolating from supportive connections and ruminating. Depression remitted and since she has no desire (cravings) to drink alcohol and anxiety is only episodic (coincide with exacerbation of breathing problems) she  started to taper gabapentin off.When she discontinued it her anxiety increased so we resumed 100 mg tid and she finds this dose to be helpful and well tolerated. She needed to put her dog to sleep recently and is grieving this loss.   Visit Diagnosis:    ICD-10-CM   1. Panic disorder  F41.0   2. Major depressive disorder, recurrent episode, in full remission ([place])  F33.42   3. Alcohol use disorder, severe, in sustained remission ([place])  F10.21     Past Psychiatric History: Please see intake H&P.   Past Medical History:  Past Medical History:  Diagnosis Date  . Allergy   . Anxiety and depression   . CAD (coronary artery disease)    incidental finding on CT scan for lung ca screening  . Constipation   . Depression   . Emphysema   . Emphysema of lung ([place])   . GERD (gastroesophageal reflux disease)   . Herpes    face, uses suppresive therapy intermittently  . Seasonal allergies     Past Surgical History:  Procedure Laterality Date  . COLONOSCOPY    . DILATION AND CURETTAGE OF UTERUS    . POLYPECTOMY    . TONSILLECTOMY AND ADENOIDECTOMY  1962    Family Psychiatric History: Reviewed.  Family History:  Family History  Problem Relation Age of Onset  . Alcohol abuse Mother   . Uterine cancer Mother   . Emphysema Mother   . Alcohol abuse Father   . Lung cancer Father   . Hypertension Father   . Emphysema Father   . Melanoma Brother   . Colon cancer Neg Hx   .  Esophageal cancer Neg Hx   . Rectal cancer Neg Hx   . Stomach cancer Neg Hx   . Colon polyps Neg Hx     Social History:  Social History   Socioeconomic History  . Marital status: Divorced    Spouse name: Not on file  . Number of children: 0  . Years of education: Not on file  . Highest education level: Bachelor's degree (e.g., BA, AB, BS)  Occupational History  . Occupation: artists  Tobacco Use  . Smoking status: Former Smoker    Packs/day: 2.00    Years: 35.00    Pack years: 70.00    Types:  Cigarettes    Quit date: 03/28/2006    Years since quitting: 13.8  . Smokeless tobacco: Never Used  Vaping Use  . Vaping Use: Never used  Substance and Sexual Activity  . Alcohol use: No  . Drug use: No  . Sexual activity: Never  Other Topics Concern  . 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 Difficulty of Paying Living Expenses: Not on file  Food Insecurity:   . Worried About Charity fundraiser in the Last Year: Not on file  . Ran Out of Food in the Last Year: Not on file  Transportation Needs:   . Lack of Transportation (Medical): Not on file  . Lack of Transportation (Non-Medical): Not on file  Physical Activity:   . Days of Exercise per Week: Not on file  . Minutes of Exercise per Session: Not on file  Stress:   . Feeling of Stress : Not on file  Social Connections:   . Frequency of Communication with Friends and Family: Not on file  . Frequency of Social Gatherings with Friends and Family: Not on file  . Attends Religious Services: Not on file  . Active Member of Clubs or Organizations: Not on file  . Attends Archivist Meetings: Not on file  . Marital Status: Not on file    Allergies:  Allergies  Allergen Reactions  . Levaquin [Levofloxacin] Other (See Comments)    tendonitis  . Penicillins Swelling     Did it involve swelling of the face/tongue/throat, SOB, or low BP? Unknown Did it involve sudden or severe rash/hives, skin peeling, or any reaction on the inside of your mouth or nose? Unknown Did you need to seek medical attention at a hospital or doctor's office? Unknown When did it last happen?young child If all above answers are "NO", may proceed with  cephalosporin use.   . Sulfa Antibiotics Nausea And Vomiting    Metabolic Disorder Labs: Lab Results  Component Value Date   HGBA1C 5.5 09/19/2013   No results found for: PROLACTIN Lab Results  Component Value Date   CHOL 250 (H) 11/11/2014   TRIG 55.0 11/11/2014   HDL 133.20 11/11/2014   CHOLHDL 2 11/11/2014   VLDL 11.0 11/11/2014   LDLCALC 106 (H) 11/11/2014   LDLCALC 127 (H) 09/19/2013   Lab Results  Component Value Date   TSH 1.13 09/19/2013    Therapeutic Level Labs: No results  found for: LITHIUM No results found for: VALPROATE No components found for:  CBMZ  Current Medications: Current Outpatient Medications  Medication Sig Dispense Refill  . acyclovir (ZOVIRAX) 400 MG tablet Take 1 tablet (400 mg total) by mouth 5 (five) times daily. (Patient taking differently: Take 400 mg by mouth daily as needed (itching in nose and eyes). ) 60 tablet 3  . albuterol (PROAIR HFA) 108 (90 Base) MCG/ACT inhaler Inhale 2 puffs into the lungs every 6 (six) hours as needed for wheezing or shortness of breath. 24 g 1  . [START ON 04/11/2020] ALPRAZolam (XANAX) 0.25 MG tablet Take 1 tablet (0.25 mg total) by mouth daily as needed for anxiety. 90 tablet 1  . [START ON 04/11/2020] buPROPion (WELLBUTRIN SR) 150 MG 12 hr tablet Take 1 tablet (150 mg total) by mouth 2 (two) times daily. 180 tablet 1  . diclofenac sodium (VOLTAREN) 1 % GEL Apply 1 application topically daily as needed (pain).   2  . FIBER PO Take 1 tablet by mouth 2 (two) times a day.    . Fluticasone-Umeclidin-Vilant (TRELEGY ELLIPTA) 100-62.5-25 MCG/INH AEPB Inhale 1 puff into the lungs daily. 60 each 11  . gabapentin (NEURONTIN) 100 MG capsule Take 1 capsule (100 mg total) by mouth 3 (three) times daily. 270 capsule 1  . ibuprofen (ADVIL,MOTRIN) 200 MG tablet Take 400 mg by mouth every 6 (six) hours as needed for headache (pain).     Marland Kitchen omeprazole (PRILOSEC) 20 MG capsule Take 20 mg by mouth daily as needed (acid reflux).      . pravastatin (PRAVACHOL) 40 MG tablet Take 40 mg by mouth daily.     No current facility-administered medications for this visit.      Psychiatric Specialty Exam: Review of Systems  All other systems reviewed and are negative.   There were no vitals taken for this visit.There is no height or weight on file to calculate BMI.  General Appearance: NA  Eye Contact:  NA  Speech:  Clear and Coherent and Normal Rate  Volume:  Normal  Mood:  Euthymic  Affect:  NA  Thought Process:  Goal Directed and Linear  Orientation:  Full (Time, Place, and Person)  Thought Content: Logical   Suicidal Thoughts:  No  Homicidal Thoughts:  No  Memory:  Immediate;   Good Recent;   Good Remote;   Good  Judgement:  Good  Insight:  Good  Psychomotor Activity:  NA  Concentration:  Concentration: Good  Recall:  Good  Fund of Knowledge: Good  Language: Good  Akathisia:  Negative  Handed:  Right  AIMS (if indicated): not done  Assets:  Communication Skills Desire for Improvement Financial Resources/Insurance Housing Resilience  ADL's:  Intact  Cognition: WNL  Sleep:  Good   Screenings: PHQ2-9     PULMONARY REHAB COPD ORIENTATION from 07/23/2012 in [place]  PHQ-2 Total Score 3  PHQ-9 Total Score 12       Assessment and Plan: 64yo divorced female with long hx ofdepression (dx with MDD in 1971) and occasional panic attacksand alcohol use disorder.She has had a good response to bupropion which she has been on for 17 years. Poor prior response to SSRIs. She is prescribed alprazolam at a low dose as needed for panic anxiety but takes it sparinglyonly one time in past month). 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 She said that  increased anxiety triggered relapse on alcohol (drank few glasses of wine) and this in turn triggered SI. She ultimately was send to [facility] where she spend 9 days. She was started on gabapentin 300 mg tid and feels that thishas been very helpful for anxiety(mother was also on gabapentin). Paden has not continued to drink after release from [facility] May 2020), denies craving, is less anxious and denies feeling depressed or suicidal. Ex-husband visits, she has a girl friend with whom she goes on walks. She has goodinsight into her anxiety and states that she "did this to herself" by isolating from supportive connections and ruminating. Depression remitted and since she has no desire (cravings) to drink alcohol and anxiety is only episodic (coincide with exacerbation of breathing problems) she started to taper gabapentin off.When she discontinued it her anxiety increased so we resumed 100 mg tid and she finds this dose to be helpful and well tolerated. She needed to put her dog to sleep recently and is grieving this loss.  Dx: Panic disorder; MDD in remission; Alcohol use disorder in sustained remission  Plan:ContinueWellbutrin SR150mg  bid,gabapentinat 100 mg tidandalprazolam 0.25 mg prnanxiety attacks. Next visit in4 monthsor prn.The plan was discussed with patient who had an opportunity to ask questions and these were all answered. I spend52minutes inphone consultation with the patient.   Stephanie Acre, MD 02/13/2020, 9:45 AM

## 2020-02-24 ENCOUNTER — Other Ambulatory Visit: Payer: Self-pay | Admitting: Pulmonary Disease

## 2020-02-24 MED ORDER — PREDNISONE 10 MG PO TABS
ORAL_TABLET | ORAL | 0 refills | Status: AC
Start: 2020-02-24 — End: 2020-03-01

## 2020-02-24 MED ORDER — AZITHROMYCIN 250 MG PO TABS
ORAL_TABLET | ORAL | 0 refills | Status: DC
Start: 2020-02-24 — End: 2020-02-28

## 2020-02-24 NOTE — Telephone Encounter (Addendum)
Dr. Halford Chessman please advise on patient mychart message   Coughing more often and more servere. Oxygen fine when sitting, but a 50 ft walk.. is has dropped as low as 86.   Pretty much staying home and on oxygen all day. My lungs really hate dry weather, so I didn't write sooner, thinking that was the problem. Figured I check with you guys.  Sorry.  Let me add.  NO FEVER and phlegm is light yellow. Thanks

## 2020-02-28 ENCOUNTER — Encounter: Payer: Self-pay | Admitting: Adult Health

## 2020-02-28 ENCOUNTER — Telehealth: Payer: Self-pay | Admitting: Adult Health

## 2020-02-28 ENCOUNTER — Ambulatory Visit (INDEPENDENT_AMBULATORY_CARE_PROVIDER_SITE_OTHER): Payer: Medicare Other

## 2020-02-28 ENCOUNTER — Ambulatory Visit: Payer: Medicare Other | Admitting: Adult Health

## 2020-02-28 ENCOUNTER — Other Ambulatory Visit: Payer: Self-pay

## 2020-02-28 VITALS — BP 128/68 | HR 106 | Temp 97.1°F | Ht 67.0 in | Wt 122.6 lb

## 2020-02-28 DIAGNOSIS — J441 Chronic obstructive pulmonary disease with (acute) exacerbation: Secondary | ICD-10-CM

## 2020-02-28 DIAGNOSIS — J439 Emphysema, unspecified: Secondary | ICD-10-CM | POA: Diagnosis not present

## 2020-02-28 DIAGNOSIS — R059 Cough, unspecified: Secondary | ICD-10-CM | POA: Diagnosis not present

## 2020-02-28 DIAGNOSIS — J9611 Chronic respiratory failure with hypoxia: Secondary | ICD-10-CM

## 2020-02-28 MED ORDER — PREDNISONE 10 MG PO TABS: 10.0000 mg | ORAL_TABLET | Freq: Every day | ORAL | 0 refills | Status: DC

## 2020-02-28 MED ORDER — AZITHROMYCIN 250 MG PO TABS: 250.0000 mg | ORAL_TABLET | Freq: Every day | ORAL | 0 refills | Status: DC

## 2020-02-28 NOTE — Telephone Encounter (Signed)
Spoke with the pt and scheduled appt with TP for today at 3:30 since Dr Halford Chessman stated in his email to her that if not improving on abx and pres she would need ov.

## 2020-02-28 NOTE — Progress Notes (Signed)
Reviewed and agree with assessment/plan.   Chesley Mires, MD [facility] Pulmonary/Critical Care 02/28/2020, 5:36 PM Pager:  [phone]

## 2020-02-28 NOTE — Assessment & Plan Note (Signed)
Continue on oxygen 2 L at bedtime.  No desaturations with ambulation.

## 2020-02-28 NOTE — Patient Instructions (Addendum)
Covid 19 testing .  Extend Zithromax for 2 days  Extend Prednisone 10mg  daily for 5 days .  Mucinex DM Twice daily  As needed  Cough/congestion  Oxygen 2l/m At bedtime   Continue on TRELEGY 1 puff daily . Rinse after use.  Albuterol Inhaler As needed  Wheezing .  Chest xray today .  Follow up with Dr. Halford Chessman  Or Jowanda Heeg NP in 6 weeks and As needed   Please contact office for sooner follow up if symptoms do not improve or worsen or seek emergency care

## 2020-02-28 NOTE — Telephone Encounter (Signed)
Called and spoke with patient, advised that [name] wants her to f/u in 2 weeks, appointment scheduled the week of 03/13/2020.  Patient asking about result of cxr done in office today.  TP stated that it does not show any pna and to continue with office recommendations.  Patient is currently scheduling covid test and will let us know the result.  Nothing further needed.

## 2020-02-28 NOTE — Assessment & Plan Note (Signed)
Slow to resolve exacerbation.  Check chest x-ray today.  Needs COVID-19 testing.  Will extend antibiotics and steroids out for a few days.  Plan  Patient Instructions  Covid 19 testing .  Extend Zithromax for 2 days  Extend Prednisone 10mg  daily for 5 days .  Mucinex DM Twice daily  As needed  Cough/congestion  Oxygen 2l/m At bedtime   Continue on TRELEGY 1 puff daily . Rinse after use.  Albuterol Inhaler As needed  Wheezing .  Chest xray today .  Follow up with Dr. Halford Chessman  Or Sopheap Boehle NP in 6 weeks and As needed   Please contact office for sooner follow up if symptoms do not improve or worsen or seek emergency care

## 2020-02-28 NOTE — Progress Notes (Signed)
@Patient  ID: Rennis Golden, female    DOB: May 26, 1955, 64 y.o.   MRN: [id number]  Chief Complaint  Patient presents with   Acute Visit    cough     Referring provider: Haywood Pao, MD  HPI: 64 year old female former smoker followed for COPD, lung nodule, chronic hypoxic respiratory failure on oxygen at bedtime  TEST/EVENTS :   Spirometry 05/30/12 >> FEV1 0.69 (26%), FEV1% 40  A1AT 05/30/12 >> 112, PI*MS  ONO with RA 07/03/12 >>Test time 8 hrs 33 min. Mean SpO2 92.6%, low SpO2 87%. Spent 8 min with SpO2 <88%.  Sputum 07/23/15 >> Pseudomonas aeruginosa  ONO with RA 03/13/18 >>test time 9 hrs 45 min. Baseline SpO2 88%, low SpO2 81%. Spent 5 hrs 57 min with SpO2 <88%.  ONO with RA 05/09/18 >>test time 10 hrs 1 min. Basal SpO2 89.8%, low SpO2 65%. Spent 202.8 min with SpO2 <88%.  ONO with 2 liters 06/12/18 >>test time 8 hrs 6 min. Basal SpO2 97.5%, SpO2 low 92%.  Chest imaging:   CT chest 04/04/09 >> apical centrilobular emphysema, 3 mm RUL nodule, 4 mm RUL nodule  Low dose CT chest 10/03/14 >> multiple pulmonary nodules up to 5 mm, mild centrilobular and paraseptal emphysema  Low dose CT chest 10/06/15 >> atherosclerosis, mod centrilobular emphysema, bronchial wall thickening, scattered nodules up to 3.6 mm  Esophagram 11/25/15 >> no specific abnormality  Low dose CT chest 10/06/16 >> no change  Low dose CT chest 03/23/18 >> atherosclerosis, multiple nodules with new nodule 7.7 mm RUL, centrilobular and paraseptal emphysema, 3 cm lesion in left lobe of liver, possible dilated bile duct  LDCT chest 08/24/18 >> atherosclerosis, numerous tiny b/l nodules up to 7.7 mm, centrilobular emphysema, 2.8 cm density in liver, 5 mm stone in Lt kidney  LDCT chest 08/27/19 >> new RUL nodule 7 mm, diffuse bronchial thickening, mod centrilobular and paraseptal emphysema, other nodules stable  Cardiac tests:  Echo 02/08/05 >> EF 40 to 50% Echo 03/30/18 >> EF 55 to 60%,  grade 2 DD   02/28/2020 Acute OV  : COPD Patient presents for an acute office visit.  Complains of 4 -5 days of of increased cough congestion increased shortness of breath low-grade fever.  Covid vaccine x2 is up-to-date.  Has not had the Covid booster.  She has had her flu shot.  Patient was called in a Z-Pak and prednisone . Last day of antibiotics today . Has 2 days of prednisone left.  Says does not feel much better. Has pain with coughing and deep inspiration. No fever since started Zpack .  Appetite is good . No loss of taste or smell. No known sick contacts.  Walk test 94-96% on room air  Remains on Trelegy daily. Is on oxygen 2 L at bedtime.      Allergies  Allergen Reactions   Levaquin [Levofloxacin] Other (See Comments)    tendonitis   Penicillins Swelling     Did it involve swelling of the face/tongue/throat, SOB, or low BP? Unknown Did it involve sudden or severe rash/hives, skin peeling, or any reaction on the inside of your mouth or nose? Unknown Did you need to seek medical attention at a hospital or doctor's office? Unknown When did it last happen?young child If all above answers are "NO", may proceed with cephalosporin use.    Sulfa Antibiotics Nausea And Vomiting    Immunization History  Administered Date(s) Administered   Fluad Quad(high Dose 65+) 12/16/2019   Influenza  Split 12/26/2016, 01/02/2018, 11/27/2018   Influenza Whole 12/27/2011, 12/04/2012   Influenza,inj,Quad PF,6+ Mos 11/27/2014, 12/27/2015, 12/17/2018   Influenza-Unspecified 12/09/2013   PFIZER SARS-COV-2 Vaccination 06/06/2019, 06/27/2019   Pneumococcal Conjugate-13 11/17/2015   Pneumococcal Polysaccharide-23 03/28/2005, 01/09/2013   Zoster 09/26/2014    Past Medical History:  Diagnosis Date   Allergy    Anxiety and depression    CAD (coronary artery disease)    incidental finding on CT scan for lung ca screening   Constipation    Depression    Emphysema     Emphysema of lung (HCC)    GERD (gastroesophageal reflux disease)    Herpes    face, uses suppresive therapy intermittently   Seasonal allergies     Tobacco History: Social History   Tobacco Use  Smoking Status Former Smoker   Packs/day: 2.00   Years: 35.00   Pack years: 70.00   Types: Cigarettes   Quit date: 03/28/2006   Years since quitting: 13.9  Smokeless Tobacco Never Used   Counseling given: Not Answered   Outpatient Medications Prior to Visit  Medication Sig Dispense Refill   acyclovir (ZOVIRAX) 400 MG tablet Take 1 tablet (400 mg total) by mouth 5 (five) times daily. (Patient taking differently: Take 400 mg by mouth daily as needed (itching in nose and eyes). ) 60 tablet 3   albuterol (PROAIR HFA) 108 (90 Base) MCG/ACT inhaler Inhale 2 puffs into the lungs every 6 (six) hours as needed for wheezing or shortness of breath. 24 g 1   [START ON 04/11/2020] ALPRAZolam (XANAX) 0.25 MG tablet Take 1 tablet (0.25 mg total) by mouth daily as needed for anxiety. 90 tablet 1   [START ON 04/11/2020] buPROPion (WELLBUTRIN SR) 150 MG 12 hr tablet Take 1 tablet (150 mg total) by mouth 2 (two) times daily. 180 tablet 1   diclofenac sodium (VOLTAREN) 1 % GEL Apply 1 application topically daily as needed (pain).   2   FIBER PO Take 1 tablet by mouth 2 (two) times a day.     Fluticasone-Umeclidin-Vilant (TRELEGY ELLIPTA) 100-62.5-25 MCG/INH AEPB Inhale 1 puff into the lungs daily. 60 each 11   gabapentin (NEURONTIN) 100 MG capsule Take 1 capsule (100 mg total) by mouth 3 (three) times daily. 270 capsule 1   ibuprofen (ADVIL,MOTRIN) 200 MG tablet Take 400 mg by mouth every 6 (six) hours as needed for headache (pain).      omeprazole (PRILOSEC) 20 MG capsule Take 20 mg by mouth daily as needed (acid reflux).      pravastatin (PRAVACHOL) 40 MG tablet Take 40 mg by mouth daily.     predniSONE (DELTASONE) 10 MG tablet Take 3 tablets (30 mg total) by mouth daily with breakfast for  2 days, THEN 2 tablets (20 mg total) daily with breakfast for 2 days, THEN 1 tablet (10 mg total) daily with breakfast for 2 days. 12 tablet 0   azithromycin (ZITHROMAX) 250 MG tablet Take 2 tablets (500 mg total) by mouth daily for 1 day, THEN 1 tablet (250 mg total) daily for 4 days. (Patient not taking: Reported on 02/28/2020) 6 tablet 0   No facility-administered medications prior to visit.     Review of Systems:   Constitutional:   No  weight loss, night sweats,   +Fevers, chills, fatigue, or  lassitude.  HEENT:   No headaches,  Difficulty swallowing,  Tooth/dental problems, or  Sore throat,  No sneezing, itching, ear ache, nasal congestion, post nasal drip,   CV:  No chest pain,  Orthopnea, PND, swelling in lower extremities, anasarca, dizziness, palpitations, syncope.   GI  No heartburn, indigestion, abdominal pain, nausea, vomiting, diarrhea, change in bowel habits, loss of appetite, bloody stools.   Resp:   No chest wall deformity  Skin: no rash or lesions.  GU: no dysuria, change in color of urine, no urgency or frequency.  No flank pain, no hematuria   MS:  No joint pain or swelling.  No decreased range of motion.  No back pain.    Physical Exam  BP 128/68 (BP Location: Right Arm, Patient Position: Sitting, Cuff Size: Normal)    Pulse (!) 106    Temp (!) 97.1 F (36.2 C) (Temporal)    Ht 5\' 7"  (1.702 m)    Wt 122 lb 9.6 oz (55.6 kg)    SpO2 95%    BMI 19.20 kg/m   GEN: A/Ox3; pleasant , NAD, well nourished    HEENT:  [place]/AT,  l, NOSE-clear, THROAT-clear, no lesions, no postnasal drip or exudate noted.   NECK:  Supple w/ fair ROM; no JVD; normal carotid impulses w/o bruits; no thyromegaly or nodules palpated; no lymphadenopathy.    RESP decreased breath sounds in the bases ,  . no accessory muscle use, no dullness to percussion  CARD:  RRR, no m/r/g, no peripheral edema, pulses intact, no cyanosis or clubbing.  GI:   Soft & nt; nml bowel sounds; no  organomegaly or masses detected.   Musco: Warm bil, no deformities or joint swelling noted.   Neuro: alert, no focal deficits noted.    Skin: Warm, no lesions or rashes    Lab Results:   BNP  Imaging: No results found.    No flowsheet data found.  No results found for: NITRICOXIDE      Assessment & Plan:   COPD with emphysema Slow to resolve exacerbation.  Check chest x-ray today.  Needs COVID-19 testing.  Will extend antibiotics and steroids out for a few days.  Plan  Patient Instructions  Covid 19 testing .  Extend Zithromax for 2 days  Extend Prednisone 10mg  daily for 5 days .  Mucinex DM Twice daily  As needed  Cough/congestion  Oxygen 2l/m At bedtime   Continue on TRELEGY 1 puff daily . Rinse after use.  Albuterol Inhaler As needed  Wheezing .  Chest xray today .  Follow up with Dr. Halford Chessman  Or Waylan Busta NP in 6 weeks and As needed   Please contact office for sooner follow up if symptoms do not improve or worsen or seek emergency care       Chronic respiratory failure with hypoxia ([place]) Continue on oxygen 2 L at bedtime.  No desaturations with ambulation.     [name], NP 02/28/2020

## 2020-03-03 NOTE — Telephone Encounter (Signed)
[name], please see mychart message sent by pt. Pt has a f/u with you 12/17 and then is scheduled to see Dr. Halford Chessman in January for a 6 week f/u.

## 2020-03-13 ENCOUNTER — Ambulatory Visit: Payer: Medicare Other | Admitting: Adult Health

## 2020-04-03 ENCOUNTER — Other Ambulatory Visit (HOSPITAL_COMMUNITY): Payer: Self-pay | Admitting: Psychiatry

## 2020-04-17 ENCOUNTER — Other Ambulatory Visit: Payer: Self-pay

## 2020-04-17 ENCOUNTER — Encounter: Payer: Self-pay | Admitting: Pulmonary Disease

## 2020-04-17 ENCOUNTER — Ambulatory Visit: Payer: Medicare Other | Admitting: Pulmonary Disease

## 2020-04-17 VITALS — BP 114/72 | HR 81 | Temp 97.0°F | Ht 67.0 in | Wt 125.4 lb

## 2020-04-17 DIAGNOSIS — J9611 Chronic respiratory failure with hypoxia: Secondary | ICD-10-CM

## 2020-04-17 DIAGNOSIS — J432 Centrilobular emphysema: Secondary | ICD-10-CM | POA: Diagnosis not present

## 2020-04-17 NOTE — Progress Notes (Signed)
[place] [facility]  Chief Complaint  Patient presents with  . Follow-up    6wk f/u for emphysema. States she has been doing well since last visit. Still using 2L of O2 at night.     Constitutional:  BP 114/72   Pulse 81   Temp (!) 97 F (36.1 C) (Temporal)   Ht 5\' 7"  (1.702 m)   Wt 125 lb 6.4 oz (56.9 kg)   SpO2 97%   BMI 19.64 kg/m   Past Medical History:  Anxiety, Depression, CAD, GERD, PNA  Past Surgical History:  She  has a past surgical history that includes Tonsillectomy and adenoidectomy (1962); Dilation and curettage of uterus; Colonoscopy; and Polypectomy.  Brief Summary:  Brandy Russell is a 65 y.o. 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       Subjective:   She was seen by [name] in December for an exacerbation.  COVID test then reported as negative.  Improved since then.  Has good days and bad days.  Not using albuterol much.  Trelegy helps.  Using oxygen at night.  Feels like she needs oxygen during the day, especially when she has bad days.  Occasionally getting cough with clear sputum and wheezing.  Weight has been steady.  Able to tolerate broader diet now w/o GI issues.  She maintained her oxygenation above 90% on room air while walking today.  Physical Exam:   Appearance - thin  ENMT - no sinus tenderness, no oral exudate, no LAN, Mallampati 3 airway, no stridor  Respiratory - decreased breath sounds bilaterally, no wheezing or rales  CV - s1s2 regular rate and rhythm, no murmurs  Ext - no clubbing, no edema  Skin - no rashes  Psych - normal mood and affect   Pulmonary testing:   Spirometry 05/30/12 >> FEV1 0.69 (26%), FEV1% 40  A1AT 05/30/12 >> 112, PI*MS  ONO with RA 07/03/12 >>Test time 8 hrs 33 min. Mean SpO2 92.6%, low SpO2 87%. Spent 8 min with SpO2 <88%.  Sputum 07/23/15 >> Pseudomonas aeruginosa  Chest Imaging:   CT chest 04/04/09 >> apical centrilobular emphysema, 3  mm RUL nodule, 4 mm RUL nodule  Low dose CT chest 10/03/14 >> multiple pulmonary nodules up to 5 mm, mild centrilobular and paraseptal emphysema  Low dose CT chest 10/06/15 >> atherosclerosis, mod centrilobular emphysema, bronchial wall thickening, scattered nodules up to 3.6 mm  Esophagram 11/25/15 >> no specific abnormality  Low dose CT chest 10/06/16 >> no change  Low dose CT chest 03/23/18 >> atherosclerosis, multiple nodules with new nodule 7.7 mm RUL, centrilobular and paraseptal emphysema, 3 cm lesion in left lobe of liver, possible dilated bile duct  LDCT chest 08/24/18 >> atherosclerosis, numerous tiny b/l nodules up to 7.7 mm, centrilobular emphysema, 2.8 cm density in liver, 5 mm stone in Lt kidney  LDCT chest 08/27/19 >> new RUL nodule 7 mm, diffuse bronchial thickening, mod centrilobular and paraseptal emphysema, other nodules stable   LDCT 12/04/19 >> no change RUL nodule, RML nodule resolve  Sleep Tests:   ONO with RA 03/13/18 >>test time 9 hrs 45 min. Baseline SpO2 88%, low SpO2 81%. Spent 5 hrs 57 min with SpO2 <88%.  ONO with RA 05/09/18 >>test time 10 hrs 1 min. Basal SpO2 89.8%, low SpO2 65%. Spent 202.8 min with SpO2 <88%.  ONO with 2 liters 06/12/18 >>test time 8 hrs 6 min. Basal SpO2 97.5%, SpO2 low 92%.  Cardiac Tests:  Echo 02/08/05 >> EF 40 to 50%  Echo 03/30/18 >> EF 55 to 60%, grade 2 DD  Social History:  She  reports that she quit smoking about 14 years ago. Her smoking use included cigarettes. She has a 70.00 pack-year smoking history. She has never used smokeless tobacco. She reports that she does not drink alcohol and does not use drugs.  Family History:  Her family history includes Alcohol abuse in her father and mother; Emphysema in her father and mother; Hypertension in her father; Lung cancer in her father; Melanoma in her brother; Uterine cancer in her mother.     Assessment/Plan:   COPD with emphysema. - continue trelegy and prn  albuterol  Nocturnal hypoxemia from emphysema. - continue 2 liters oxygen at night - explained role for supplemental oxygen and when she would need to use supplemental oxygen - goal SpO2 > 90% - will arrange for 6 minute walk test to determine if she would qualify for supplemental oxygen during the day  Lung cancer screening. - reviewed her CT scan from September 2021 - she is scheduled for follow up scan in September 2022  Major depression. - f/u with Dr. Montel Culver  Coronary atherosclerosis, diastolic CHF. - followed by Dr. Jenkins Rouge with [place]  Bloating with irregular bowel pattern. - likely related to prior antibiotic use - improved - advised her to use probiotic or eat yogurt when she needs to go on antibiotics again  Time Spent Involved in Patient Care on Day of Examination:  32 minutes  Follow up:  Patient Instructions  Will arrange for 6 minute walk test  Follow up in 8 months   Medication List:   Allergies as of 04/17/2020      Reactions   Levaquin [levofloxacin] Other (See Comments)   tendonitis   Penicillins Swelling   Did it involve swelling of the face/tongue/throat, SOB, or low BP? Unknown Did it involve sudden or severe rash/hives, skin peeling, or any reaction on the inside of your mouth or nose? Unknown Did you need to seek medical attention at a hospital or doctor's office? Unknown When did it last happen?young child If all above answers are "NO", may proceed with cephalosporin use.   Sulfa Antibiotics Nausea And Vomiting      Medication List       Accurate as of April 17, 2020 10:47 AM. If you have any questions, ask your nurse or doctor.        STOP taking these medications   azithromycin 250 MG tablet Commonly known as: ZITHROMAX Stopped by: Chesley Mires, MD   predniSONE 10 MG tablet Commonly known as: DELTASONE Stopped by: Chesley Mires, MD     TAKE these medications   acyclovir 400 MG tablet Commonly known  as: ZOVIRAX Take 1 tablet (400 mg total) by mouth 5 (five) times daily. What changed:   when to take this  reasons to take this   albuterol 108 (90 Base) MCG/ACT inhaler Commonly known as: ProAir HFA Inhale 2 puffs into the lungs every 6 (six) hours as needed for wheezing or shortness of breath.   ALPRAZolam 0.25 MG tablet Commonly known as: XANAX Take 1 tablet (0.25 mg total) by mouth daily as needed for anxiety.   buPROPion 150 MG 12 hr tablet Commonly known as: WELLBUTRIN SR Take 1 tablet (150 mg total) by mouth 2 (two) times daily.   diclofenac sodium 1 % Gel Commonly known as: VOLTAREN Apply 1 application topically daily as needed (pain).  FIBER PO Take 1 tablet by mouth 2 (two) times a day.   gabapentin 100 MG capsule Commonly known as: NEURONTIN Take 1 capsule (100 mg total) by mouth 3 (three) times daily.   ibuprofen 200 MG tablet Commonly known as: ADVIL Take 400 mg by mouth every 6 (six) hours as needed for headache (pain).   omeprazole 20 MG capsule Commonly known as: PRILOSEC Take 20 mg by mouth daily as needed (acid reflux).   pravastatin 40 MG tablet Commonly known as: PRAVACHOL Take 40 mg by mouth daily.   Trelegy Ellipta 100-62.5-25 MCG/INH Aepb Generic drug: Fluticasone-Umeclidin-Vilant Inhale 1 puff into the lungs daily.       Signature:  Chesley Mires, MD [place] Pager - [phone] 04/17/2020, 10:47 AM

## 2020-04-17 NOTE — Patient Instructions (Addendum)
Will arrange for 6 minute walk test  Follow up in 8 months

## 2020-04-29 NOTE — Progress Notes (Signed)
Virtual Visit via Telephone Note   This visit type was conducted due to national recommendations for restrictions regarding the COVID-19 Pandemic (e.g. social distancing) in an effort to limit this patient's exposure and mitigate transmission in our community.  Due to her co-morbid illnesses, this patient is at least at moderate risk for complications without adequate follow up.  This format is felt to be most appropriate for this patient at this time.  The patient did not have access to video technology/had technical difficulties with video requiring transitioning to audio format only (telephone).  All issues noted in this document were discussed and addressed.  No physical exam could be performed with this format.  Please refer to the patient's chart for her  consent to telehealth for [facility].    Date:  04/30/2020   ID:  Brandy Russell, DOB 06-23-1955, MRN [id number] The patient was identified using 2 identifiers.  Patient Location: Home Provider Location: Home Office  PCP:  Tisovec, Fransico Him, MD  Cardiologist:  Dr. Jenkins Rouge, MD  Electrophysiologist:  None   Evaluation Performed:  Follow-Up Visit  Chief Complaint: 1 year follow-up  History of Present Illness:    Brandy Russell is a 65 y.o. female with emphysema with pulmonary nodules followed by pulmonary medicine and chronic diastolic CHF.  Patient was initially seen by Dr. Johnsie Cancel for dyspnea 04/12/2018 after referral from Dr. Halford Chessman for progressive fatigue and ongoing dyspnea.  Echocardiogram at that time 03/30/2018 showed an LVEF of 55 to 60% with G2 DD with no significant valvular disease and no signs of pulmonary hypertension.  Given G2 DD she was tried on HCTZ however did not tolerate therefore she was transitioned to Lasix 10 mg daily.  Myoview stress test from 09/13/2018 showed no evidence of ischemia or infarct with a small defect of moderate severity in the basal and anterior septal location felt to be secondary  to septum thinning and with no ischemia.  She was eventually referred to psychiatry by Dr. Halford Chessman for anxiety and depression.  At that time, she was started on Wellbutrin and Xanax.  She was most recently seen 04/17/2020 by pulmonary medicine at which time she continued to use 2 L nasal cannula for supplemental oxygen at night.  Plan was for 6 minute exercise study to evaluate for daytime oxygen use.   Today she begins our conversation by yelling at me on the phone stating that she does not understand why she was put on Lasix and no one from our office went over her test results (from 2020). I quickly clarified that there are notes in the computer from when her echo and stress test were performed and resulted with clear communication with her about the results with understanding. She has taken herself off Lasix and states that it did nothing for her. She then realized that she was talking to "her provider" and her tone changed. She does not feel that she needs to see cardiology anymore and wishes to see [name] as needed which seems appropriate. Her CV tests were normal. She denies chest pain or worsening SOB.   The patient does not have symptoms concerning for COVID-19 infection (fever, chills, cough, or new shortness of breath).    Past Medical History:  Diagnosis Date  . Allergy   . Anxiety and depression   . CAD (coronary artery disease)    incidental finding on CT scan for lung ca screening  . Constipation   . Depression   . Emphysema   .  Emphysema of lung ([place])   . GERD (gastroesophageal reflux disease)   . Herpes    face, uses suppresive therapy intermittently  . Seasonal allergies    Past Surgical History:  Procedure Laterality Date  . COLONOSCOPY    . DILATION AND CURETTAGE OF UTERUS    . POLYPECTOMY    . TONSILLECTOMY AND ADENOIDECTOMY  1962     Current Meds  Medication Sig  . acyclovir (ZOVIRAX) 400 MG tablet Take 1 tablet (400 mg total) by mouth 5 (five) times daily. (Patient  taking differently: Take 400 mg by mouth daily as needed (itching in nose and eyes).)  . albuterol (PROAIR HFA) 108 (90 Base) MCG/ACT inhaler Inhale 2 puffs into the lungs every 6 (six) hours as needed for wheezing or shortness of breath.  . ALPRAZolam (XANAX) 0.25 MG tablet Take 1 tablet (0.25 mg total) by mouth daily as needed for anxiety.  Marland Kitchen buPROPion (WELLBUTRIN SR) 150 MG 12 hr tablet Take 1 tablet (150 mg total) by mouth 2 (two) times daily.  . diclofenac sodium (VOLTAREN) 1 % GEL Apply 1 application topically daily as needed (pain).   . FIBER PO Take 1 tablet by mouth 2 (two) times a day.  . Fluticasone-Umeclidin-Vilant (TRELEGY ELLIPTA) 100-62.5-25 MCG/INH AEPB Inhale 1 puff into the lungs daily.  Marland Kitchen gabapentin (NEURONTIN) 100 MG capsule Take 1 capsule (100 mg total) by mouth 3 (three) times daily.  Marland Kitchen ibuprofen (ADVIL,MOTRIN) 200 MG tablet Take 400 mg by mouth every 6 (six) hours as needed for headache (pain).   Marland Kitchen omeprazole (PRILOSEC) 20 MG capsule Take 20 mg by mouth daily as needed (acid reflux).   . pravastatin (PRAVACHOL) 40 MG tablet Take 40 mg by mouth daily.     Allergies:   Levaquin [levofloxacin], Penicillins, and Sulfa antibiotics   Social History   Tobacco Use  . Smoking status: Former Smoker    Packs/day: 2.00    Years: 35.00    Pack years: 70.00    Types: Cigarettes    Quit date: 03/28/2006    Years since quitting: 14.1  . Smokeless tobacco: Never Used  Vaping Use  . Vaping Use: Never used  Substance Use Topics  . Alcohol use: No  . Drug use: No     Family Hx: The patient's family history includes Alcohol abuse in her father and mother; Emphysema in her father and mother; Hypertension in her father; Lung cancer in her father; Melanoma in her brother; Uterine cancer in her mother. There is no history of Colon cancer, Esophageal cancer, Rectal cancer, Stomach cancer, or Colon polyps.  ROS:   Please see the history of present illness.     All other systems  reviewed and are negative.  Prior CV studies:   The following studies were reviewed today:  Myoview stress test 09/13/2018:   Nuclear stress EF: 71%.  There was no ST segment deviation noted during stress.  Defect 1: There is a small defect of moderate severity present in the basal anteroseptal location.  This is a low risk study.  The left ventricular ejection fraction is hyperdynamic (>65%).   Normal stress nuclear study with thinning of the basal septum but no ischemia.  Gated ejection fraction 71% with normal wall motion.  Echocardiogram 03/30/2018:  - Left ventricle: The cavity size was normal. Wall thickness was  normal. Systolic function was normal. The estimated ejection  fraction was in the range of 55% to 60%. Wall motion was normal;  there were no regional  wall motion abnormalities. Features are  consistent with a pseudonormal left ventricular filling pattern,  with concomitant abnormal relaxation and increased filling  pressure (grade 2 diastolic dysfunction).  - Aortic valve: Trileaflet; mildly thickened, mildly calcified  leaflets.   Labs/Other Tests and Data Reviewed:    EKG:  No ECG reviewed.  Recent Labs: No results found for requested labs within last 8760 hours.   Recent Lipid Panel Lab Results  Component Value Date/Time   CHOL 250 (H) 11/11/2014 09:12 AM   TRIG 55.0 11/11/2014 09:12 AM   HDL 133.20 11/11/2014 09:12 AM   CHOLHDL 2 11/11/2014 09:12 AM   LDLCALC 106 (H) 11/11/2014 09:12 AM   LDLDIRECT 102.8 04/16/2012 01:33 PM    Wt Readings from Last 3 Encounters:  04/30/20 125 lb (56.7 kg)  04/17/20 125 lb 6.4 oz (56.9 kg)  02/28/20 122 lb 9.6 oz (55.6 kg)     Objective:    Vital Signs:  Ht 5\' 7"  (1.702 m)   Wt 125 lb (56.7 kg)   BMI 19.58 kg/m    VITAL SIGNS:  reviewed GEN:  no acute distress NEURO:  alert and oriented x 3, no obvious focal deficit  ASSESSMENT & PLAN:    1.  Dyspnea with coronary atherosclerosis and  diastolic CHF: -Echocardiogram with G2 DD with normal LVEF.  Due to atherosclerosis on CT she underwent Myoview stress testing which was found to be low risk with no ischemia found. Since last visit, she denies CV symptoms. She would like to follow as needed with [name] from here on out however will at least schedule for 1 year and she can cancel if she wishes  -Pt self discontinued Lasix   2.  Emphysema: -Followed by pulmonary medicine with  4.  Anxiety/depression: -Followed by psychiatry, initially seen 05/16/2018 and placed on Wellbutrin and Xanax   COVID-19 Education: The signs and symptoms of COVID-19 were discussed with the patient and how to seek care for testing (follow up with PCP or arrange E-visit). The importance of social distancing was discussed today.  Time:   Today, I have spent 20 minutes with the patient with telehealth technology discussing the above problems.     Medication Adjustments/Labs and Tests Ordered: Current medicines are reviewed at length with the patient today.  Concerns regarding medicines are outlined above.   Tests Ordered: No orders of the defined types were placed in this encounter.   Medication Changes: No orders of the defined types were placed in this encounter.   Follow Up:  Dr. Johnsie Cancel in 1 year   Signed, Kathyrn Drown, NP  04/30/2020 10:14 AM    [facility]\

## 2020-04-30 ENCOUNTER — Telehealth (INDEPENDENT_AMBULATORY_CARE_PROVIDER_SITE_OTHER): Payer: Medicare Other | Admitting: Cardiology

## 2020-04-30 ENCOUNTER — Encounter: Payer: Self-pay | Admitting: Cardiology

## 2020-04-30 ENCOUNTER — Other Ambulatory Visit: Payer: Self-pay

## 2020-04-30 VITALS — Ht 67.0 in | Wt 125.0 lb

## 2020-04-30 DIAGNOSIS — I2510 Atherosclerotic heart disease of native coronary artery without angina pectoris: Secondary | ICD-10-CM | POA: Diagnosis not present

## 2020-04-30 DIAGNOSIS — R0600 Dyspnea, unspecified: Secondary | ICD-10-CM | POA: Diagnosis not present

## 2020-04-30 NOTE — Patient Instructions (Signed)
Medication Instructions:  Your physician recommends that you continue on your current medications as directed. Please refer to the Current Medication list given to you today.  *If you need a refill on your cardiac medications before your next appointment, please call your pharmacy*   Lab Work: NONE If you have labs (blood work) drawn today and your tests are completely normal, you will receive your results only by: Marland Kitchen MyChart Message (if you have MyChart) OR . A paper copy in the mail If you have any lab test that is abnormal or we need to change your treatment, we will call you to review the results.   Testing/Procedures: NONE   Follow-Up: At [facility], you and your health needs are our priority.  As part of our continuing mission to provide you with exceptional heart care, we have created designated Provider Care Teams.  These Care Teams include your primary Cardiologist (physician) and Advanced Practice Providers (APPs -  Physician Assistants and Nurse Practitioners) who all work together to provide you with the care you need, when you need it.  We recommend signing up for the patient portal called "MyChart".  Sign up information is provided on this After Visit Summary.  MyChart is used to connect with patients for Virtual Visits (Telemedicine).  Patients are able to view lab/test results, encounter notes, upcoming appointments, etc.  Non-urgent messages can be sent to your provider as well.   To learn more about what you can do with MyChart, go to [email or web address].    Your next appointment:   1 year  The format for your next appointment:   In Person  Provider:     Kathyrn Drown, NP

## 2020-05-06 ENCOUNTER — Other Ambulatory Visit (HOSPITAL_COMMUNITY): Payer: Self-pay | Admitting: Psychiatry

## 2020-06-04 ENCOUNTER — Telehealth (HOSPITAL_COMMUNITY): Payer: Medicare Other | Admitting: Psychiatry

## 2020-06-04 ENCOUNTER — Other Ambulatory Visit: Payer: Self-pay

## 2020-06-04 ENCOUNTER — Other Ambulatory Visit (HOSPITAL_COMMUNITY): Payer: Self-pay | Admitting: Psychiatry

## 2020-06-04 MED ORDER — GABAPENTIN 100 MG PO CAPS: 100.0000 mg | ORAL_CAPSULE | Freq: Three times a day (TID) | ORAL | 0 refills | Status: DC

## 2020-07-02 NOTE — Telephone Encounter (Signed)
Please advise on patient mychart message  I have had a bad loose cough for a few months.  Getting worse.  my phlegm is dark yellow and I have no fever. It got worse my mid- February, I didn't think much about it because I figured it would pass.   My energy is  low.  My oxygen is dropping with light activity.  Cough is pretty bad all day, but it is less at night.  It usually only wakes me up once or twice. It really did not worry me until this week.  I've tested every time you guys have asked me to.  I do have one more at home test, but this feels very familiar to me. Thanks, Brandy Russell

## 2020-07-03 MED ORDER — AZITHROMYCIN 250 MG PO TABS: 250.0000 mg | ORAL_TABLET | ORAL | 0 refills | Status: DC

## 2020-07-03 NOTE — Telephone Encounter (Signed)
Please call in a script for a zpak.  She needs an ROV if she isn't doing better after finishing zpak.

## 2020-07-03 NOTE — Telephone Encounter (Signed)
Called and spoke with the pt and notified of response per Dr Halford Chessman  She verbalized understanding  I have sent rx to her preferred pharm

## 2020-07-13 NOTE — Telephone Encounter (Signed)
Please see other encounter from today with updates. Will close this encounter.

## 2020-07-13 NOTE — Telephone Encounter (Signed)
Please advise on patient mychart message  Dr. Halford Chessman,      You asked that I let you know if I am better after antibiotic.  My phlegm is a lighter color.  My energy was better for a few days. No fever. I told you in my last 2 appts that my oxygen had been in the 80's fairly often.  My cough is not better, but feels normal for current pollen levels.    Yesterday I walked slowly to my car twice with light items.  I was in BIG trouble.   I made it back inside, put oxygen on, sat down, and oximeter said that my oxygen was 80 and pulse was 50.  It was VERY scary.  I took about 10 minutes to be able to dial the phone in order to talk to call my friend.  It took another 15 minutes to see normal stats and stand up.   I realize my oximeter is probably not as accurate as a medical office, but based on the way I felt and was able to think. it was not off.       The 6 minute walk done in the clean medical environment does not duplicate my daily environment . The warmer weather along with the incline of where I have to walk makes me work harder physically ! I am deeply concerned that my vital organs are not getting the amount of oxygen to perform leaving me totally exhausted . I would like to do the 6 minute walk in my environment and for you to see what is actually occurring. Receiving supplemental/ portable oxygen would benefit me and help with my anxiety doing daily tasks like going to the grocery store . I live alone and I feel that one day my stats will not recover and that is an emergency situation . This situation is limiting my quality of life  Thank you. Antony Blackbird [facility])

## 2020-07-14 NOTE — Telephone Encounter (Signed)
Please advise on patient mychart message  Brandy Russell, [name], MD 3 hours ago (5:52 AM)   I will call when you open, but I think it will be a complete waste of time for me to walk for 6 minutes in a clean environment, excellent air quality, carrying nothing, with no excitement, stress or distractions.  I have never felt my oxygen was low in those circumstances.    My family, friends, and my friends from [facility] (that have portable oxygen) are all very concerned.

## 2020-07-14 NOTE — Telephone Encounter (Signed)
Patient has been scheduled for Televisit for tomorrow 07/15/20 to discuss treatment plan with Dr. Halford Chessman per his request. She also stated that she scheduled to 6 minute walk for 07/16/20 which can be discussed for tomorrow. Will route to Dr. Halford Chessman as Juluis Rainier. Nothing further needed at this time.

## 2020-07-15 ENCOUNTER — Encounter: Payer: Self-pay | Admitting: Pulmonary Disease

## 2020-07-15 ENCOUNTER — Other Ambulatory Visit: Payer: Self-pay

## 2020-07-15 ENCOUNTER — Ambulatory Visit (INDEPENDENT_AMBULATORY_CARE_PROVIDER_SITE_OTHER): Payer: Medicare Other | Admitting: Pulmonary Disease

## 2020-07-15 DIAGNOSIS — J9611 Chronic respiratory failure with hypoxia: Secondary | ICD-10-CM | POA: Diagnosis not present

## 2020-07-15 DIAGNOSIS — J432 Centrilobular emphysema: Secondary | ICD-10-CM | POA: Diagnosis not present

## 2020-07-15 NOTE — Patient Instructions (Addendum)
Will call with results of 6 minute walk test  Follow up in 3 months

## 2020-07-15 NOTE — Progress Notes (Signed)
[facility]  Chief Complaint  Patient presents with  . Follow-up    To discuss tx options    Constitutional:  There were no vitals taken for this visit.  Deferred.  Past Medical History:  Anxiety, Depression, CAD, GERD, PNA  Past Surgical History:  She  has a past surgical history that includes Tonsillectomy and adenoidectomy (1962); Dilation and curettage of uterus; Colonoscopy; and Polypectomy.  Brief Summary:  Brandy Russell is a 65 y.o. 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       Subjective:   Virtual Visit via Telephone Note  I connected with Rennis Golden on 07/15/20 at 12:00 PM EDT by telephone and verified that I am speaking with the correct person using two identifiers.  Location: Patient: home Provider: medical office   I discussed the limitations, risks, security and privacy concerns of performing an evaluation and management service by telephone and the availability of in person appointments. I also discussed with the patient that there may be a patient responsible charge related to this service. The patient expressed understanding and agreed to proceed.  She has been getting episodes of dyspnea with exertion.  These are happening more frequently.  She has found her SpO2 at home dropping to the mid to low 80s.  She feels like her brain doesn't function when her oxygen gets this low.  Her heart rate normal runs high during these events, but one time was in the 50s.  She gets more cough and congestion when she goes outside due to the pollen.  Physical Exam:   Deferred.   Pulmonary testing:   Spirometry 05/30/12 >> FEV1 0.69 (26%), FEV1% 40  A1AT 05/30/12 >> 112, PI*MS  ONO with RA 07/03/12 >>Test time 8 hrs 33 min. Mean SpO2 92.6%, low SpO2 87%. Spent 8 min with SpO2 <88%.  Sputum 07/23/15 >> Pseudomonas aeruginosa  Chest Imaging:   CT chest 04/04/09 >> apical centrilobular emphysema, 3 mm  RUL nodule, 4 mm RUL nodule  Low dose CT chest 10/03/14 >> multiple pulmonary nodules up to 5 mm, mild centrilobular and paraseptal emphysema  Low dose CT chest 10/06/15 >> atherosclerosis, mod centrilobular emphysema, bronchial wall thickening, scattered nodules up to 3.6 mm  Esophagram 11/25/15 >> no specific abnormality  Low dose CT chest 10/06/16 >> no change  Low dose CT chest 03/23/18 >> atherosclerosis, multiple nodules with new nodule 7.7 mm RUL, centrilobular and paraseptal emphysema, 3 cm lesion in left lobe of liver, possible dilated bile duct  LDCT chest 08/24/18 >> atherosclerosis, numerous tiny b/l nodules up to 7.7 mm, centrilobular emphysema, 2.8 cm density in liver, 5 mm stone in Lt kidney  LDCT chest 08/27/19 >> new RUL nodule 7 mm, diffuse bronchial thickening, mod centrilobular and paraseptal emphysema, other nodules stable   LDCT 12/04/19 >> no change RUL nodule, RML nodule resolve  Sleep Tests:   ONO with RA 03/13/18 >>test time 9 hrs 45 min. Baseline SpO2 88%, low SpO2 81%. Spent 5 hrs 57 min with SpO2 <88%.  ONO with RA 05/09/18 >>test time 10 hrs 1 min. Basal SpO2 89.8%, low SpO2 65%. Spent 202.8 min with SpO2 <88%.  ONO with 2 liters 06/12/18 >>test time 8 hrs 6 min. Basal SpO2 97.5%, SpO2 low 92%.  Cardiac Tests:   Echo 02/08/05 >> EF 40 to 50%  Echo 03/30/18 >> EF 55 to 60%, grade 2 DD  Social History:  She  reports that she quit smoking  about 14 years ago. Her smoking use included cigarettes. She has a 70.00 pack-year smoking history. She has never used smokeless tobacco. She reports that she does not drink alcohol and does not use drugs.  Family History:  Her family history includes Alcohol abuse in her father and mother; Emphysema in her father and mother; Hypertension in her father; Lung cancer in her father; Melanoma in her brother; Uterine cancer in her mother.     Assessment/Plan:   COPD with emphysema. - continue trelegy and prn  albuterol  Nocturnal hypoxemia from emphysema. - she is on 2 liters oxygen at night - she seems to be at a point that she will need to have oxygen with exertion during the day - she has 6 minute walk test scheduled for 07/16/20; advised her to walk as briskly as she can - if she doesn't have oxygen desaturation with 6 minute walk test, then could consider doing room air ABG - she states that her brother might be able to purchase a POC for her, but she would prefer not to ask for this type of a favor if she can get a POC set up through insurance  Lung cancer screening. - reviewed her CT scan from September 2021 - she is scheduled for follow up scan in September 2022  Major depression. - f/u with Dr. Montel Culver  Coronary atherosclerosis, diastolic CHF. - followed by Dr. Jenkins Rouge with [facility]  Bloating with irregular bowel pattern. - likely related to prior antibiotic use - improved - advised her to use probiotic or eat yogurt when she needs to go on antibiotics again  Time Spent Involved in Patient Care on Day of Examination:   I discussed the assessment and treatment plan with the patient. The patient was provided an opportunity to ask questions and all were answered. The patient agreed with the plan and demonstrated an understanding of the instructions.   The patient was advised to call back or seek an in-person evaluation if the symptoms worsen or if the condition fails to improve as anticipated.  I provided 22 minutes of non-face-to-face time during this encounter.  Follow up:  Patient Instructions  Will call with results of 6 minute walk test  Follow up in 3 months   Medication List:   Allergies as of 07/15/2020      Reactions   Levaquin [levofloxacin] Other (See Comments)   tendonitis   Penicillins Swelling   Did it involve swelling of the face/tongue/throat, SOB, or low BP? Unknown Did it involve sudden or severe rash/hives, skin peeling, or any  reaction on the inside of your mouth or nose? Unknown Did you need to seek medical attention at a hospital or doctor's office? Unknown When did it last happen?young child If all above answers are "NO", may proceed with cephalosporin use.   Sulfa Antibiotics Nausea And Vomiting      Medication List       Accurate as of July 15, 2020 12:59 PM. If you have any questions, ask your nurse or doctor.        STOP taking these medications   acyclovir 400 MG tablet Commonly known as: ZOVIRAX Stopped by: Chesley Mires, MD   azithromycin 250 MG tablet Commonly known as: ZITHROMAX Stopped by: Chesley Mires, MD   omeprazole 20 MG capsule Commonly known as: PRILOSEC Stopped by: Chesley Mires, MD     TAKE these medications   albuterol 108 (90 Base) MCG/ACT inhaler Commonly known as: ProAir HFA Inhale  2 puffs into the lungs every 6 (six) hours as needed for wheezing or shortness of breath.   ALPRAZolam 0.25 MG tablet Commonly known as: XANAX Take 1 tablet (0.25 mg total) by mouth daily as needed for anxiety.   buPROPion 150 MG 12 hr tablet Commonly known as: WELLBUTRIN SR Take 1 tablet (150 mg total) by mouth 2 (two) times daily.   diclofenac sodium 1 % Gel Commonly known as: VOLTAREN Apply 1 application topically daily as needed (pain).   FIBER PO Take 1 tablet by mouth 2 (two) times a day.   gabapentin 100 MG capsule Commonly known as: NEURONTIN Take 1 capsule (100 mg total) by mouth 3 (three) times daily.   ibuprofen 200 MG tablet Commonly known as: ADVIL Take 400 mg by mouth every 6 (six) hours as needed for headache (pain).   pravastatin 40 MG tablet Commonly known as: PRAVACHOL Take 40 mg by mouth daily.   Trelegy Ellipta 100-62.5-25 MCG/INH Aepb Generic drug: Fluticasone-Umeclidin-Vilant Inhale 1 puff into the lungs daily.       Signature:  Chesley Mires, MD [place] Pager - [phone] 07/15/2020, 12:59 PM

## 2020-07-16 ENCOUNTER — Ambulatory Visit (INDEPENDENT_AMBULATORY_CARE_PROVIDER_SITE_OTHER): Payer: Medicare Other

## 2020-07-16 DIAGNOSIS — J432 Centrilobular emphysema: Secondary | ICD-10-CM

## 2020-07-16 DIAGNOSIS — J9611 Chronic respiratory failure with hypoxia: Secondary | ICD-10-CM

## 2020-07-16 NOTE — Progress Notes (Signed)
Six Minute Walk - 07/16/20 1113      Six Minute Walk   Medications taken before test (dose and time) Wellbutrin 150mg , Trelegy Ellipta 100-62.5-25mcg, Gabapentin 100mg  taken at 8am    Supplemental oxygen during test? Yes    O2 Flow Rate (L/min) 2 L/min    Type Continuous    Lap distance in meters  34 meters    Laps Completed 8    Partial lap (in meters) 0 meters    Baseline BP (sitting) 120/70    Baseline Heartrate 94    Baseline Dyspnea (Borg Scale) 3    Baseline Fatigue (Borg Scale) 0.5    Baseline SPO2 97 %      End of Test Values    BP (sitting) 140/90    Heartrate 110    Dyspnea (Borg Scale) 6    Fatigue (Borg Scale) 2    SPO2 98 %   on 2L     2 Minutes Post Walk Values   BP (sitting) 130/88    Heartrate 100    SPO2 100 %   on 2L   Stopped or paused before six minutes? Yes    Reason: Pt stopped at 44min 5sec walk for O2 spot check (sats 91% on room air), stopped at 15min 25sec for O2 spot check (sats 86% on room air, pt placed on 2L O2), had to fully stop walk with 24min remaining due to breathing    Other Symptoms at end of exercise: Dizziness      Interpretation   Distance completed 272 meters    Tech Comments: Pt started walking at a brisk pace, had to stop multiple times for O2 spot check. Pt did qualify for oxygen and had to stop for 94min after O2 was placed so she can try to get her breath. Pt had to fully stop the walk with 34min remaining due to her breathing. Pt had complaints of severe SOB and dizziness.

## 2020-08-05 MED ORDER — TRELEGY ELLIPTA 100-62.5-25 MCG/INH IN AEPB: 1.0000 | INHALATION_SPRAY | Freq: Every day | RESPIRATORY_TRACT | 0 refills | Status: DC

## 2020-08-25 NOTE — Telephone Encounter (Signed)
Dr. Sood, please see mychart message sent by pt and advise. °

## 2020-08-26 NOTE — Telephone Encounter (Signed)
Dr. Sood, Please see patient comment and advise.  Thank you. 

## 2020-09-14 ENCOUNTER — Other Ambulatory Visit: Payer: Self-pay | Admitting: Adult Health

## 2020-09-30 ENCOUNTER — Ambulatory Visit (INDEPENDENT_AMBULATORY_CARE_PROVIDER_SITE_OTHER): Payer: Medicare Other | Admitting: Family Medicine

## 2020-09-30 ENCOUNTER — Other Ambulatory Visit: Payer: Self-pay

## 2020-09-30 ENCOUNTER — Encounter: Payer: Self-pay | Admitting: Family Medicine

## 2020-09-30 DIAGNOSIS — M545 Low back pain, unspecified: Secondary | ICD-10-CM | POA: Diagnosis not present

## 2020-09-30 DIAGNOSIS — G8929 Other chronic pain: Secondary | ICD-10-CM

## 2020-09-30 NOTE — Progress Notes (Signed)
Office Visit Note   Patient: Brandy Russell           Date of Birth: [birth year]           MRN: [id number] Visit Date: 09/30/2020 Requested by: Haywood Pao, MD [address] [place],  [place] [address] PCP: Haywood Pao, MD  Subjective: Chief Complaint  Patient presents with   Lower Back - Pain    1 year ago, she started having numbness in her feet when she was sitting with her legs straight out in front of her (while reading). 6 months ago, she started having pain in the middle of her lower back. This pain does ease up with standing and walking around. NKI    HPI: She is here with bilateral foot numbness and low back pain.  Symptoms started about a year ago, no injury.  First she felt intermittent numbness in both feet involving the second through fifth toes.  Then she started having low back pain intermittently as well.  Pain is worse when sitting and reading, better when moving around.  Denies weakness in her legs, denies bowel or bladder dysfunction.  She has not taken anything for her pain.  She takes gabapentin for anxiety.  It does not seem to have an effect on her current symptoms.               ROS:   All other systems were reviewed and are negative.  Objective: Vital Signs: There were no vitals taken for this visit.  Physical Exam:  General:  Alert and oriented, in no acute distress. Pulm:  Breathing unlabored. Psy:  Normal mood, congruent affect. Skin: She has a rash around her mouth. Low back: There is slight tenderness in the midline at the L5-S1 level.  Negative bilateral straight leg raise, no pain with internal hip rotation.  Lower extremity strength and reflexes are normal.   Imaging: No results found.  Assessment & Plan: Chronic low back pain with bilateral foot numbness, suspicious for lumbar spinal stenosis. -Elected to try physical therapy.  If symptoms persist, then x-rays and MRI scan of the lumbar spine.  If negative for stenosis, then  nerve conduction studies.     Procedures: No procedures performed        PMFS History: Patient Active Problem List   Diagnosis Date Noted   Chronic respiratory failure with hypoxia ([place]) 02/28/2020   Major depressive disorder, recurrent episode, in full remission ([place]) 08/28/2018   Alcohol use disorder, severe, in sustained remission ([place]) 08/28/2018   Nocturnal hypoxemia due to emphysema ([place]) 05/24/2018   Panic disorder 05/16/2018   Community acquired pneumonia 07/26/2017   Coronary artery disease involving native coronary artery of native heart without angina pectoris 11/11/2014   CN (constipation) 11/11/2014   Herpes 11/11/2014   Ejection fraction < 50% 05/30/2012   COPD with emphysema ([place]) 04/16/2012   Depression 04/16/2012   Past Medical History:  Diagnosis Date   Allergy    Anxiety and depression    CAD (coronary artery disease)    incidental finding on CT scan for lung ca screening   Constipation    Depression    Emphysema    Emphysema of lung (HCC)    GERD (gastroesophageal reflux disease)    Herpes    face, uses suppresive therapy intermittently   Seasonal allergies     Family History  Problem Relation Age of Onset   Alcohol abuse Mother    Uterine cancer Mother  Emphysema Mother    Alcohol abuse Father    Lung cancer Father    Hypertension Father    Emphysema Father    Melanoma Brother    Colon cancer Neg Hx    Esophageal cancer Neg Hx    Rectal cancer Neg Hx    Stomach cancer Neg Hx    Colon polyps Neg Hx     Past Surgical History:  Procedure Laterality Date   COLONOSCOPY     DILATION AND CURETTAGE OF UTERUS     POLYPECTOMY     TONSILLECTOMY AND ADENOIDECTOMY  1962   Social History   Occupational History   Occupation: artists  Tobacco Use   Smoking status: Former    Packs/day: 2.00    Years: 35.00    Pack years: 70.00    Types: Cigarettes    Quit date: 03/28/2006    Years since quitting: 14.5   Smokeless tobacco: Never  Vaping  Use   Vaping Use: Never used  Substance and Sexual Activity   Alcohol use: No   Drug use: No   Sexual activity: Never

## 2020-10-12 ENCOUNTER — Telehealth: Payer: Self-pay | Admitting: Pulmonary Disease

## 2020-10-12 ENCOUNTER — Ambulatory Visit: Payer: Medicare Other | Admitting: Physical Therapy

## 2020-10-12 NOTE — Telephone Encounter (Signed)
Primary Pulmonologist: Sood Last office visit and with whom: 07/15/20 VS What do we see them for (pulmonary problems): Centrilobular emphysema ([place])  Chronic respiratory failure with hypoxia (HCC)    Last OV assessment/plan:  Assessment/Plan:    COPD with emphysema. - continue trelegy and prn albuterol   Nocturnal hypoxemia from emphysema. - she is on 2 liters oxygen at night - she seems to be at a point that she will need to have oxygen with exertion during the day - she has 6 minute walk test scheduled for 07/16/20; advised her to walk as briskly as she can - if she doesn't have oxygen desaturation with 6 minute walk test, then could consider doing room air ABG - she states that her brother might be able to purchase a POC for her, but she would prefer not to ask for this type of a favor if she can get a POC set up through insurance   Lung cancer screening. - reviewed her CT scan from September 2021 - she is scheduled for follow up scan in September 2022   Major depression. - f/u with Dr. Montel Culver   Coronary atherosclerosis, diastolic CHF. - followed by Dr. Jenkins Rouge with [facility]   Bloating with irregular bowel pattern. - likely related to prior antibiotic use - improved - advised her to use probiotic or eat yogurt when she needs to go on antibiotics again   Time Spent Involved in Patient Care on Day of Examination:    I discussed the assessment and treatment plan with the patient. The patient was provided an opportunity to ask questions and all were answered. The patient agreed with the plan and demonstrated an understanding of the instructions.   The patient was advised to call back or seek an in-person evaluation if the symptoms worsen or if the condition fails to improve as anticipated.  Was appointment offered to patient (explain)?  Pt does not want OV   Reason for call: Pt states cough that is now dry but was productive on Saturday 10/10/20 (thick). Pt c/o  increased fatigue and temp of 100 last night and 99 today. Pt states not using OTCs for cough but did take Tylenol last night to reduce fever. Pt denies N/V/D. Pt took 2 home Covid test both of which where negative. Dr. Halford Chessman please advise.    (examples of things to ask: : When did symptoms start? Fever? Cough? Productive? Color to sputum? More sputum than usual? Wheezing? Have you needed increased oxygen? Are you taking your respiratory medications? What over the counter measures have you tried?)  Allergies  Allergen Reactions   Levaquin [Levofloxacin] Other (See Comments)    tendonitis   Penicillins Swelling     Did it involve swelling of the face/tongue/throat, SOB, or low BP? Unknown Did it involve sudden or severe rash/hives, skin peeling, or any reaction on the inside of your mouth or nose? Unknown Did you need to seek medical attention at a hospital or doctor's office? Unknown When did it last happen?  young child     If all above answers are "NO", may proceed with cephalosporin use.    Sulfa Antibiotics Nausea And Vomiting    Immunization History  Administered Date(s) Administered   Fluad Quad(high Dose 65+) 12/16/2019   Influenza Split 12/26/2016, 01/02/2018, 11/27/2018   Influenza Whole 12/27/2011, 12/04/2012   Influenza,inj,Quad PF,6+ Mos 11/27/2014, 12/27/2015, 12/17/2018   Influenza-Unspecified 12/09/2013   PFIZER(Purple Top)SARS-COV-2 Vaccination 06/06/2019, 06/27/2019, 03/13/2020   Pneumococcal Conjugate-13 11/17/2015  Pneumococcal Polysaccharide-23 03/28/2005, 01/09/2013   Zoster, Live 09/26/2014

## 2020-10-12 NOTE — Telephone Encounter (Signed)
Called pt see telephone encounter from 10/12/20

## 2020-10-13 MED ORDER — AZITHROMYCIN 250 MG PO TABS: ORAL_TABLET | ORAL | 0 refills | Status: DC

## 2020-10-13 MED ORDER — BENZONATATE 100 MG PO CAPS: 200.0000 mg | ORAL_CAPSULE | Freq: Three times a day (TID) | ORAL | 1 refills | Status: DC | PRN

## 2020-10-13 NOTE — Telephone Encounter (Signed)
Can send script for zpak.  Can send script for tessalon 200 mg tid prn for cough.  She should schedule an appointment if her symptoms don't improve.

## 2020-10-13 NOTE — Telephone Encounter (Signed)
Spoke with pt and reviewed Dr. Juanetta Gosling recommendations including medications and their instructions. Pt stated understanding. Medication orders were placed. Pt advised if symptoms did not improve she should schedule an OV. Nothing further needed at this time.

## 2020-10-21 ENCOUNTER — Other Ambulatory Visit: Payer: Self-pay

## 2020-10-21 ENCOUNTER — Ambulatory Visit: Payer: Medicare Other | Attending: Family Medicine | Admitting: Physical Therapy

## 2020-10-21 ENCOUNTER — Encounter: Payer: Self-pay | Admitting: Physical Therapy

## 2020-10-21 VITALS — HR 83

## 2020-10-21 DIAGNOSIS — M25652 Stiffness of left hip, not elsewhere classified: Secondary | ICD-10-CM | POA: Insufficient documentation

## 2020-10-21 DIAGNOSIS — M5440 Lumbago with sciatica, unspecified side: Secondary | ICD-10-CM | POA: Diagnosis not present

## 2020-10-21 DIAGNOSIS — M6281 Muscle weakness (generalized): Secondary | ICD-10-CM | POA: Insufficient documentation

## 2020-10-21 DIAGNOSIS — G8929 Other chronic pain: Secondary | ICD-10-CM | POA: Insufficient documentation

## 2020-10-21 DIAGNOSIS — M25651 Stiffness of right hip, not elsewhere classified: Secondary | ICD-10-CM | POA: Insufficient documentation

## 2020-10-21 NOTE — Therapy (Signed)
[facility] [place], [place], [address] Phone: [phone]   Fax:  [phone]  Physical Therapy Evaluation  Patient Details  Name: Brandy Russell MRN: [id number] Date of Birth: [birth year] Referring Provider (PT): Eunice Blase, MD   Encounter Date: 10/21/2020   PT End of Session - 10/21/20 1330     Visit Number 1    Number of Visits 13    Date for PT Re-Evaluation 12/04/20    Authorization Type [employer] MCR, FOTO 6th/10th, KX starting at visit 15    PT Start Time 0913    PT Stop Time 1003    PT Time Calculation (min) 50 min    Activity Tolerance Patient tolerated treatment well    Behavior During Therapy WFL for tasks assessed/performed             Past Medical History:  Diagnosis Date   Allergy    Anxiety and depression    CAD (coronary artery disease)    incidental finding on CT scan for lung ca screening   Constipation    Depression    Emphysema    Emphysema of lung ([place])    GERD (gastroesophageal reflux disease)    Herpes    face, uses suppresive therapy intermittently   Seasonal allergies     Past Surgical History:  Procedure Laterality Date   COLONOSCOPY     DILATION AND CURETTAGE OF UTERUS     POLYPECTOMY     TONSILLECTOMY AND ADENOIDECTOMY  1962    Vitals:   10/21/20 0931  Pulse: 83  SpO2: 97%      Subjective Assessment - 10/21/20 0932     Subjective Patient reports >1 year her feet started appearing numb R>L while reclining in bed and reading, then progressed to Lumbar pain. Changing positions improved pain but is less affected recently.    Pertinent History Anx/dep, COPD, GERD, CAD    Limitations Reading;Other (comment)   Supine, side lying.   Patient Stated Goals To be able to read in bed without numbness, pain.    Currently in Pain? Yes    Pain Score 0-No pain    Pain Location Back    Pain Descriptors / Indicators Aching    Pain Type Chronic pain    Pain Radiating  Towards Numbness in B feet    Aggravating Factors  supine/sidelying    Pain Relieving Factors Activity out of bed.    Effect of Pain on Daily Activities Reading and computer.                [facility] PT Assessment - 10/21/20 0001       Assessment   Medical Diagnosis LBP B feet numbness    Referring Provider (PT) Eunice Blase, MD    Onset Date/Surgical Date --   >1 year   Hand Dominance Right    Next MD Visit TBD    Prior Therapy No      Precautions   Precautions None      Restrictions   Weight Bearing Restrictions No      Balance Screen   Has the patient fallen in the past 6 months Yes    How many times? 1    Has the patient had a decrease in activity level because of a fear of falling?  No    Is the patient reluctant to leave their home because of a fear of falling?  No      Home Environment   Living  Environment Private residence    Living Arrangements Alone    Type of [place] to enter    Entrance Stairs-Number of Steps 3    Entrance Stairs-Rails None    Home Layout One level      Prior Function   Level of Independence Independent with basic ADLs    Leisure Artist      Cognition   Overall Cognitive Status Within Functional Limits for tasks assessed      Observation/Other Assessments   Observations O2 tank 2L not working    Focus on Therapeutic Outcomes (FOTO)  62% (68% predicted)      Sensation   Light Touch Appears Intact      Posture/Postural Control   Posture/Postural Control Postural limitations    Postural Limitations Rounded Shoulders      AROM   Lumbar Flexion 35cm  fingertips to floor    Lumbar Extension 50%    Lumbar - Right Side Bend 56cm    Lumbar - Left Side Bend 58cm    Lumbar - Right Rotation 50%    Lumbar - Left Rotation 50%      Strength   Right Hip Flexion 4/5    Right Hip ABduction 4-/5    Left Hip Flexion 4-/5    Left Hip ABduction 4-/5      Flexibility   Hamstrings L=45deg R=50deg from 90/90       Palpation   SI assessment  (-) FABER and comp/dist for SI involvement.                        Objective measurements completed on examination: See above findings.               PT Education - 10/21/20 1329     Education Details Results of assessment, FOTO score, POC, HEP    Person(s) Educated Patient    Methods Explanation;Demonstration;Verbal cues    Comprehension Verbalized understanding;Returned demonstration              PT Short Term Goals - 10/21/20 1358       PT SHORT TERM GOAL #1   Title Patient will be independent with initial HEP for symptom management.    Baseline no exercise program    Status New    Target Date 11/04/20      PT SHORT TERM GOAL #2   Title Patient education for proper body mechanics to improve daily activity without increasing pain.    Baseline Limited knowledge.    Status New    Target Date 11/11/20               PT Long Term Goals - 10/21/20 1402       PT LONG TERM GOAL #1   Title Patient will be independent with final HEP and progression to continue to reduce pain and symptoms after discharge.    Baseline no exercise program, limited activity secondary COVID pandemic    Status New    Target Date 12/04/20      PT LONG TERM GOAL #2   Title FOTO score will increase to 68% as predicted for improved perception of functional abilities.    Baseline 62    Status New    Target Date 12/04/20      PT LONG TERM GOAL #3   Title Patient will demonstrate proper body mechanics for lifting 8# object from floor to table with pain no more than  2/10 to represent household chores.    Baseline Self limited lifting from floor    Status New    Target Date 12/04/20      PT LONG TERM GOAL #4   Title Patient will improve B hamstring length to >60deg to improve ability to bend with less difficulty during household tasks.    Baseline R=45deg L=50deg    Status New    Target Date 12/04/20                    Plan  - 10/21/20 1333     Clinical Impression Statement 65 yo famale referred to OPPT with low back pain and radicular symptoms in B feet, L worse than R. Patient presented to PT evaluation ambulating without an assistive device with portable O2 tank that was not working correctly and was turned off. She states she has had one mechanical tripping fall in the last 6 months but does not endorse balance problems.  Patient states she uses O2=2L and night and PRN during the day, SpO2=97% HR=86bpm at rest on RA during evaluation. Patient c/o pain with prolonged reading while reclined in bed that starts with tingling B feet and progresses to LBP.  Patient with B decreased hamstring length, decreased core stability with sudden "give-way" during trunk movement, decreased trunk ROM and hip strength.  Patient with increased pain during repeated lumbar extension, no change with repeated flexion.  Patient with mild forward shoulder posture.  Patient does not seem to have SI involvement, patient is awaiting possible MRI depending on progress with PT. Patient lives alone and drives.  Patient will benefit from skilled PT to address deficits and maximize safe functional mobility at home and in the community.    Personal Factors and Comorbidities Time since onset of injury/illness/exacerbation;Comorbidity 3+    Comorbidities COPD with chronic cough on 2L O2 at night and as needed during the day, anx/dep, CAD    Examination-Activity Limitations Bed Mobility;Bend;Squat;Stairs;Transfers    Examination-Participation Restrictions Cleaning;Laundry    Stability/Clinical Decision Making Stable/Uncomplicated    Clinical Decision Making Low    Rehab Potential Good    PT Frequency 2x / week    PT Duration 6 weeks    PT Treatment/Interventions ADLs/Self Care Home Management;Cryotherapy;Aquatic Therapy;Moist Heat;Stair training;Functional mobility training;Therapeutic activities;Therapeutic exercise;Neuromuscular re-education;Patient/family  education;Manual techniques;Passive range of motion;Dry needling;Vasopneumatic Device    PT Next Visit Plan Assess results of HEP and progress as appropriate, assess balance?, core/LE strength. May need wedge for supine. Give patient hamstring stretch for home.    PT Home Exercise Plan [id number]    Consulted and Agree with Plan of Care Patient             Patient will benefit from skilled therapeutic intervention in order to improve the following deficits and impairments:  Abnormal gait, Decreased endurance, Decreased mobility, Difficulty walking, Hypomobility, Cardiopulmonary status limiting activity, Decreased range of motion, Improper body mechanics, Decreased activity tolerance, Decreased strength, Impaired flexibility, Pain  Visit Diagnosis: Chronic low back pain with sciatica, sciatica laterality unspecified, unspecified back pain laterality  Stiffness of left hip, not elsewhere classified  Stiffness of right hip, not elsewhere classified  Muscle weakness (generalized)     Problem List Patient Active Problem List   Diagnosis Date Noted   Chronic respiratory failure with hypoxia ([place]) 02/28/2020   Major depressive disorder, recurrent episode, in full remission ([place]) 08/28/2018   Alcohol use disorder, severe, in sustained remission ([place]) 08/28/2018   Nocturnal hypoxemia due to emphysema (  [place]) 05/24/2018   Panic disorder 05/16/2018   Community acquired pneumonia 07/26/2017   Coronary artery disease involving native coronary artery of native heart without angina pectoris 11/11/2014   CN (constipation) 11/11/2014   Herpes 11/11/2014   Ejection fraction < 50% 05/30/2012   COPD with emphysema ([place]) 04/16/2012   Depression 04/16/2012    Pollyann Samples, PT 10/21/2020, 2:45 PM  [facility] [address] [place], [place], [address] Phone: [phone]   Fax:  [phone]  Name: Brandy Russell MRN: [id number] Date of  Birth: 03-Jan-1956

## 2020-10-21 NOTE — Patient Instructions (Signed)
Access Code: [email or web address] URL: [email or web address] Date: 10/21/2020 Prepared by: Pollyann Samples  Exercises Supine Bridge - 1 x daily - 7 x weekly - 3 sets - 10 reps Active Straight Leg Raise with Quad Set - 1 x daily - 7 x weekly - 3 sets - 10 reps Supine Lower Trunk Rotation - 1 x daily - 7 x weekly - 2 sets - 10 reps

## 2020-10-21 NOTE — Addendum Note (Signed)
Addended by: Pollyann Samples on: 10/21/2020 02:48 PM   Modules accepted: Orders

## 2020-10-27 ENCOUNTER — Encounter: Payer: Self-pay | Admitting: Physical Therapy

## 2020-10-27 ENCOUNTER — Ambulatory Visit: Payer: Medicare Other | Attending: Family Medicine | Admitting: Physical Therapy

## 2020-10-27 ENCOUNTER — Other Ambulatory Visit: Payer: Self-pay

## 2020-10-27 DIAGNOSIS — M5440 Lumbago with sciatica, unspecified side: Secondary | ICD-10-CM | POA: Diagnosis present

## 2020-10-27 DIAGNOSIS — G8929 Other chronic pain: Secondary | ICD-10-CM | POA: Diagnosis present

## 2020-10-27 DIAGNOSIS — M25651 Stiffness of right hip, not elsewhere classified: Secondary | ICD-10-CM

## 2020-10-27 DIAGNOSIS — M6281 Muscle weakness (generalized): Secondary | ICD-10-CM

## 2020-10-27 DIAGNOSIS — M25652 Stiffness of left hip, not elsewhere classified: Secondary | ICD-10-CM | POA: Diagnosis present

## 2020-10-27 NOTE — Therapy (Signed)
[place], [place], [address] Phone: [phone]   Fax:  [phone]  Physical Therapy Treatment  Patient Details  Name: Brandy Russell MRN: [id number] Date of Birth: 04-14-1955 Referring Provider (PT): Eunice Blase, MD   Encounter Date: 10/27/2020   PT End of Session - 10/27/20 1059     Visit Number 2    Number of Visits 13    Date for PT Re-Evaluation 12/04/20    Authorization Type UHC MCR, FOTO 6th/10th, KX starting at visit 15    PT Start Time 1100    PT Stop Time 1142    PT Time Calculation (min) 42 min    Activity Tolerance Patient tolerated treatment well    Behavior During Therapy [facility] for tasks assessed/performed             Past Medical History:  Diagnosis Date   Allergy    Anxiety and depression    CAD (coronary artery disease)    incidental finding on CT scan for lung ca screening   Constipation    Depression    Emphysema    Emphysema of lung ([place])    GERD (gastroesophageal reflux disease)    Herpes    face, uses suppresive therapy intermittently   Seasonal allergies     Past Surgical History:  Procedure Laterality Date   COLONOSCOPY     DILATION AND CURETTAGE OF UTERUS     POLYPECTOMY     TONSILLECTOMY AND ADENOIDECTOMY  1962    There were no vitals filed for this visit.   Subjective Assessment - 10/27/20 1100     Subjective Pt reports that when she sits up in bed she has numbness on the outside of both feet.  Denies saddle anesthesia.  Denies BB changes.  Currenlty 0/10 pain.  States she is improving.    Pertinent History Anx/dep, COPD, GERD, CAD    Limitations Reading;Other (comment)   Supine, side lying.   Patient Stated Goals To be able to read in bed without numbness, pain.                [facility] PT Assessment - 10/27/20 0001       Flexibility   Hamstrings L=48 R 50                                   [facility] Adult PT  Treatment/Exercise:  Therapeutic Exercise: - Nu step - L5 5' - LTR 20x - Supine clamsell - 2x10 ea- RTB alternating - glute bridge with RTB for ER cue - 2x10 - hip adduction squeeze - supine hooklying - 10'' x10 - Alternating SLR - with ankles on foam roller - 2x10 ea     PT Short Term Goals - 10/21/20 1358       PT SHORT TERM GOAL #1   Title Patient will be independent with initial HEP for symptom management.    Baseline no exercise program    Status New    Target Date 11/04/20      PT SHORT TERM GOAL #2   Title Patient education for proper body mechanics to improve daily activity without increasing pain.    Baseline Limited knowledge.    Status New    Target Date 11/11/20               PT Long Term Goals - 10/21/20 1402  PT LONG TERM GOAL #1   Title Patient will be independent with final HEP and progression to continue to reduce pain and symptoms after discharge.    Baseline no exercise program, limited activity secondary COVID pandemic    Status New    Target Date 12/04/20      PT LONG TERM GOAL #2   Title FOTO score will increase to 68% as predicted for improved perception of functional abilities.    Baseline 62    Status New    Target Date 12/04/20      PT LONG TERM GOAL #3   Title Patient will demonstrate proper body mechanics for lifting 8# object from floor to table with pain no more than 2/10 to represent household chores.    Baseline Self limited lifting from floor    Status New    Target Date 12/04/20      PT LONG TERM GOAL #4   Title Patient will improve B hamstring length to >60deg to improve ability to bend with less difficulty during household tasks.    Baseline R=45deg L=50deg    Status New    Target Date 12/04/20                   Plan - 10/27/20 1123     Clinical Impression Statement Pt reports no increase in baseline pain following therapy  HEP was reviewed, but left unchanged    Overall, Ryn Ogrady is  progressing well with therapy.  Today we concentrated on core strengthening and lower extremity strengthening.  Pt tolerated session well.  We will continue to progress strength and mobility as tolerated.  Pt will continue to benefit from skilled physical therapy to address remaining deficits and achieve listed goals.  Continue per POC.    Personal Factors and Comorbidities Time since onset of injury/illness/exacerbation;Comorbidity 3+    Comorbidities COPD with chronic cough on 2L O2 at night and as needed during the day, anx/dep, CAD    Examination-Activity Limitations Bed Mobility;Bend;Squat;Stairs;Transfers    Examination-Participation Restrictions Cleaning;Laundry    Stability/Clinical Decision Making Stable/Uncomplicated    Rehab Potential Good    PT Frequency 2x / week    PT Duration 6 weeks    PT Treatment/Interventions ADLs/Self Care Home Management;Cryotherapy;Aquatic Therapy;Moist Heat;Stair training;Functional mobility training;Therapeutic activities;Therapeutic exercise;Neuromuscular re-education;Patient/family education;Manual techniques;Passive range of motion;Dry needling;Vasopneumatic Device    PT Next Visit Plan Assess results of HEP and progress as appropriate, assess balance?, core/LE strength. May need wedge for supine. Give patient hamstring stretch for home.    PT Home Exercise Plan [id number]    Consulted and Agree with Plan of Care Patient             Patient will benefit from skilled therapeutic intervention in order to improve the following deficits and impairments:  Abnormal gait, Decreased endurance, Decreased mobility, Difficulty walking, Hypomobility, Cardiopulmonary status limiting activity, Decreased range of motion, Improper body mechanics, Decreased activity tolerance, Decreased strength, Impaired flexibility, Pain  Visit Diagnosis: Chronic low back pain with sciatica, sciatica laterality unspecified, unspecified back pain laterality  Stiffness of left hip,  not elsewhere classified  Stiffness of right hip, not elsewhere classified  Muscle weakness (generalized)     Problem List Patient Active Problem List   Diagnosis Date Noted   Chronic respiratory failure with hypoxia ([place]) 02/28/2020   Major depressive disorder, recurrent episode, in full remission ([place]) 08/28/2018   Alcohol use disorder, severe, in sustained remission ([place]) 08/28/2018   Nocturnal hypoxemia due to  emphysema ([place]) 05/24/2018   Panic disorder 05/16/2018   Community acquired pneumonia 07/26/2017   Coronary artery disease involving native coronary artery of native heart without angina pectoris 11/11/2014   CN (constipation) 11/11/2014   Herpes 11/11/2014   Ejection fraction < 50% 05/30/2012   COPD with emphysema ([place]) 04/16/2012   Depression 04/16/2012    Mathis Dad 10/27/2020, 11:33 AM  [facility] [address] [place], [place], [address] Phone: [phone]   Fax:  [phone]  Name: Elfreda Malbrough MRN: [id number] Date of Birth: [birth year]

## 2020-10-29 ENCOUNTER — Encounter: Payer: Medicare Other | Admitting: Physical Therapy

## 2020-11-03 ENCOUNTER — Ambulatory Visit: Payer: Medicare Other | Admitting: Physical Therapy

## 2020-11-05 ENCOUNTER — Other Ambulatory Visit: Payer: Self-pay

## 2020-11-05 ENCOUNTER — Encounter: Payer: Self-pay | Admitting: Physical Therapy

## 2020-11-05 ENCOUNTER — Ambulatory Visit: Payer: Medicare Other | Admitting: Physical Therapy

## 2020-11-05 DIAGNOSIS — M25651 Stiffness of right hip, not elsewhere classified: Secondary | ICD-10-CM

## 2020-11-05 DIAGNOSIS — M25652 Stiffness of left hip, not elsewhere classified: Secondary | ICD-10-CM

## 2020-11-05 DIAGNOSIS — M6281 Muscle weakness (generalized): Secondary | ICD-10-CM

## 2020-11-05 DIAGNOSIS — M5440 Lumbago with sciatica, unspecified side: Secondary | ICD-10-CM | POA: Diagnosis not present

## 2020-11-05 DIAGNOSIS — G8929 Other chronic pain: Secondary | ICD-10-CM

## 2020-11-05 NOTE — Therapy (Signed)
[place], [place], [address] Phone: [phone]   Fax:  [phone]  Physical Therapy Treatment  Patient Details  Name: Brandy Russell MRN: [id number] Date of Birth: May 23, 1955 Referring Provider (PT): Eunice Blase, MD   Encounter Date: 11/05/2020   PT End of Session - 11/05/20 1050     Visit Number 3    Number of Visits 13    Date for PT Re-Evaluation 12/04/20    Authorization Type UHC MCR, FOTO 6th/10th, KX starting at visit 15    PT Start Time 1048    PT Stop Time 1129    PT Time Calculation (min) 41 min    Activity Tolerance Patient tolerated treatment well    Behavior During Therapy [facility] for tasks assessed/performed             Past Medical History:  Diagnosis Date   Allergy    Anxiety and depression    CAD (coronary artery disease)    incidental finding on CT scan for lung ca screening   Constipation    Depression    Emphysema    Emphysema of lung ([place])    GERD (gastroesophageal reflux disease)    Herpes    face, uses suppresive therapy intermittently   Seasonal allergies     Past Surgical History:  Procedure Laterality Date   COLONOSCOPY     DILATION AND CURETTAGE OF UTERUS     POLYPECTOMY     TONSILLECTOMY AND ADENOIDECTOMY  1962    There were no vitals filed for this visit.   Subjective Assessment - 11/05/20 1054     Subjective Pt reports that she feels like she is significantly improved.  She still has some numbness in her feet with prolonged sitting in bed, but this is less severe.  She has 0/10 back pain currently.    Pertinent History Anx/dep, COPD, GERD, CAD    Limitations Reading;Other (comment)   Supine, side lying.   Patient Stated Goals To be able to read in bed without numbness, pain.             [place] Adult PT Treatment/Exercise:   Therapeutic Exercise: - Nu step - L5 5' - LTR 20x - Supine alternating clamshell - 3x10 ea- GTB alternating -  glute bridge with GTB for ER cue - 2x10 - hip adduction squeeze - supine hooklying - 10'' 2x10 - Alternating SLR - with ankles on foam roller - 2x10 ea - Isometric ball squeeze in supine - 5" x8     PT Short Term Goals - 10/21/20 1358       PT SHORT TERM GOAL #1   Title Patient will be independent with initial HEP for symptom management.    Baseline no exercise program    Status New    Target Date 11/04/20      PT SHORT TERM GOAL #2   Title Patient education for proper body mechanics to improve daily activity without increasing pain.    Baseline Limited knowledge.    Status New    Target Date 11/11/20               PT Long Term Goals - 10/21/20 1402       PT LONG TERM GOAL #1   Title Patient will be independent with final HEP and progression to continue to reduce pain and symptoms after discharge.    Baseline no exercise program, limited activity secondary COVID pandemic    Status New  Target Date 12/04/20      PT LONG TERM GOAL #2   Title FOTO score will increase to 68% as predicted for improved perception of functional abilities.    Baseline 62    Status New    Target Date 12/04/20      PT LONG TERM GOAL #3   Title Patient will demonstrate proper body mechanics for lifting 8# object from floor to table with pain no more than 2/10 to represent household chores.    Baseline Self limited lifting from floor    Status New    Target Date 12/04/20      PT LONG TERM GOAL #4   Title Patient will improve B hamstring length to >60deg to improve ability to bend with less difficulty during household tasks.    Baseline R=45deg L=50deg    Status New    Target Date 12/04/20                   Plan - 11/05/20 1114     Clinical Impression Statement Pt reports no increase in baseline pain following therapy  HEP was reviewed, but left unchanged    Overall, Brandy Russell is progressing well with therapy.  Today we concentrated on core strengthening and hip  strengthening.  Pt is reporting reduced LE n/t and no pain today.  We were able to progress to isometic ball squeeze for core strengthening today.  We will continue to progress core strength and mobility as able.  Pt will continue to benefit from skilled physical therapy to address remaining deficits and achieve listed goals.  Continue per POC.    Personal Factors and Comorbidities Time since onset of injury/illness/exacerbation;Comorbidity 3+    Comorbidities COPD with chronic cough on 2L O2 at night and as needed during the day, anx/dep, CAD    Examination-Activity Limitations Bed Mobility;Bend;Squat;Stairs;Transfers    Examination-Participation Restrictions Cleaning;Laundry    Stability/Clinical Decision Making Stable/Uncomplicated    Rehab Potential Good    PT Frequency 2x / week    PT Duration 6 weeks    PT Treatment/Interventions ADLs/Self Care Home Management;Cryotherapy;Aquatic Therapy;Moist Heat;Stair training;Functional mobility training;Therapeutic activities;Therapeutic exercise;Neuromuscular re-education;Patient/family education;Manual techniques;Passive range of motion;Dry needling;Vasopneumatic Device    PT Next Visit Plan Assess results of HEP and progress as appropriate, assess balance?, core/LE strength. May need wedge for supine. Give patient hamstring stretch for home.    PT Home Exercise Plan [id number]    Consulted and Agree with Plan of Care Patient             Patient will benefit from skilled therapeutic intervention in order to improve the following deficits and impairments:  Abnormal gait, Decreased endurance, Decreased mobility, Difficulty walking, Hypomobility, Cardiopulmonary status limiting activity, Decreased range of motion, Improper body mechanics, Decreased activity tolerance, Decreased strength, Impaired flexibility, Pain  Visit Diagnosis: Chronic low back pain with sciatica, sciatica laterality unspecified, unspecified back pain laterality  Stiffness of  left hip, not elsewhere classified  Stiffness of right hip, not elsewhere classified  Muscle weakness (generalized)     Problem List Patient Active Problem List   Diagnosis Date Noted   Chronic respiratory failure with hypoxia ([place]) 02/28/2020   Major depressive disorder, recurrent episode, in full remission ([place]) 08/28/2018   Alcohol use disorder, severe, in sustained remission ([place]) 08/28/2018   Nocturnal hypoxemia due to emphysema ([place]) 05/24/2018   Panic disorder 05/16/2018   Community acquired pneumonia 07/26/2017   Coronary artery disease involving native coronary artery of native heart  without angina pectoris 11/11/2014   CN (constipation) 11/11/2014   Herpes 11/11/2014   Ejection fraction < 50% 05/30/2012   COPD with emphysema ([place]) 04/16/2012   Depression 04/16/2012    Shearon Balo PT, DPT 11/05/20 11:28 AM  [facility] [address] [place], [place], [address] Phone: [phone]   Fax:  [phone]  Name: Brandy Russell MRN: [id number] Date of Birth: 1955/07/20

## 2020-11-10 ENCOUNTER — Ambulatory Visit: Payer: Medicare Other | Admitting: Physical Therapy

## 2020-11-12 ENCOUNTER — Other Ambulatory Visit: Payer: Self-pay

## 2020-11-12 ENCOUNTER — Ambulatory Visit: Payer: Medicare Other | Admitting: Physical Therapy

## 2020-11-12 ENCOUNTER — Encounter: Payer: Self-pay | Admitting: Physical Therapy

## 2020-11-12 DIAGNOSIS — M6281 Muscle weakness (generalized): Secondary | ICD-10-CM

## 2020-11-12 DIAGNOSIS — M25651 Stiffness of right hip, not elsewhere classified: Secondary | ICD-10-CM

## 2020-11-12 DIAGNOSIS — M5440 Lumbago with sciatica, unspecified side: Secondary | ICD-10-CM | POA: Diagnosis not present

## 2020-11-12 DIAGNOSIS — M25652 Stiffness of left hip, not elsewhere classified: Secondary | ICD-10-CM

## 2020-11-12 DIAGNOSIS — G8929 Other chronic pain: Secondary | ICD-10-CM

## 2020-11-12 NOTE — Therapy (Signed)
[place], [place], [address] Phone: [phone]   Fax:  [phone]  Physical Therapy Treatment  Patient Details  Name: Brandilyn Bartosik MRN: [id number] Date of Birth: 11-03-1955 Referring Provider (PT): Eunice Blase, MD   Encounter Date: 11/12/2020   PT End of Session - 11/12/20 0957     Visit Number 4    Number of Visits 13    Date for PT Re-Evaluation 12/04/20    Authorization Type UHC MCR, FOTO 6th/10th, KX starting at visit 15    PT Start Time 1000    PT Stop Time 1042    PT Time Calculation (min) 42 min    Activity Tolerance Patient tolerated treatment well    Behavior During Therapy [facility] for tasks assessed/performed             Past Medical History:  Diagnosis Date   Allergy    Anxiety and depression    CAD (coronary artery disease)    incidental finding on CT scan for lung ca screening   Constipation    Depression    Emphysema    Emphysema of lung ([place])    GERD (gastroesophageal reflux disease)    Herpes    face, uses suppresive therapy intermittently   Seasonal allergies     Past Surgical History:  Procedure Laterality Date   COLONOSCOPY     DILATION AND CURETTAGE OF UTERUS     POLYPECTOMY     TONSILLECTOMY AND ADENOIDECTOMY  1962    There were no vitals filed for this visit.   Subjective Assessment - 11/12/20 1003     Subjective Pt reports that overall she is doing well.  She has had a flare up of hip pain, but the n/t in her feet is improving.  2/10 bil hip pain currently.  Aggs:  lying on side  Eases: topical cream    Pertinent History Anx/dep, COPD, GERD, CAD    Limitations Reading;Other (comment)   Supine, side lying.   Patient Stated Goals To be able to read in bed without numbness, pain.             [place] Adult PT Treatment/Exercise:   Therapeutic Exercise: - Nu step - L5 5' - LTR 20x - Supine alternating clamshell - 3x10 ea- Black TB alternating -  glute bridge with GTB for ER cue - 3x10 - hip adduction piliates squeeze - supine hooklying - 5'' 2x8 - Alternating SLR - with ankles on foam roller - 1# - 2x10 ea - Isometric RA ball squeeze in supine - 10" 2x    PT Short Term Goals - 11/12/20 [id number]       PT SHORT TERM GOAL #1   Title Patient will be independent with initial HEP for symptom management.    Baseline no exercise program    Status Achieved    Target Date 11/04/20      PT SHORT TERM GOAL #2   Title Patient education for proper body mechanics to improve daily activity without increasing pain.    Baseline Limited knowledge.    Status Achieved    Target Date 11/11/20               PT Long Term Goals - 10/21/20 1402       PT LONG TERM GOAL #1   Title Patient will be independent with final HEP and progression to continue to reduce pain and symptoms after discharge.    Baseline no exercise program,  limited activity secondary COVID pandemic    Status New    Target Date 12/04/20      PT LONG TERM GOAL #2   Title FOTO score will increase to 68% as predicted for improved perception of functional abilities.    Baseline 62    Status New    Target Date 12/04/20      PT LONG TERM GOAL #3   Title Patient will demonstrate proper body mechanics for lifting 8# object from floor to table with pain no more than 2/10 to represent household chores.    Baseline Self limited lifting from floor    Status New    Target Date 12/04/20      PT LONG TERM GOAL #4   Title Patient will improve B hamstring length to >60deg to improve ability to bend with less difficulty during household tasks.    Baseline R=45deg L=50deg    Status New    Target Date 12/04/20                   Plan - 11/12/20 1029     Clinical Impression Statement Pt reports no increase in baseline pain following therapy  HEP was reviewed, but left unchanged    Overall, Lillyanna Ciolli is progressing well with therapy.  Today we concentrated on  core strengthening, lower extremity strengthening, and hip strengthening.  Pt is able to progress mat exercises as expected.  Will continue to concentrate on lateral hip and glute max strengthening moving forward (possibly bridge with march next session).  Pt will continue to benefit from skilled physical therapy to address remaining deficits and achieve listed goals.  Continue per POC.    Personal Factors and Comorbidities Time since onset of injury/illness/exacerbation;Comorbidity 3+    Comorbidities COPD with chronic cough on 2L O2 at night and as needed during the day, anx/dep, CAD    Examination-Activity Limitations Bed Mobility;Bend;Squat;Stairs;Transfers    Examination-Participation Restrictions Cleaning;Laundry    Stability/Clinical Decision Making Stable/Uncomplicated    Rehab Potential Good    PT Frequency 2x / week    PT Duration 6 weeks    PT Treatment/Interventions ADLs/Self Care Home Management;Cryotherapy;Aquatic Therapy;Moist Heat;Stair training;Functional mobility training;Therapeutic activities;Therapeutic exercise;Neuromuscular re-education;Patient/family education;Manual techniques;Passive range of motion;Dry needling;Vasopneumatic Device    PT Next Visit Plan Assess results of HEP and progress as appropriate, assess balance?, core/LE strength. May need wedge for supine. Give patient hamstring stretch for home.    PT Home Exercise Plan [id number]    Consulted and Agree with Plan of Care Patient             Patient will benefit from skilled therapeutic intervention in order to improve the following deficits and impairments:  Abnormal gait, Decreased endurance, Decreased mobility, Difficulty walking, Hypomobility, Cardiopulmonary status limiting activity, Decreased range of motion, Improper body mechanics, Decreased activity tolerance, Decreased strength, Impaired flexibility, Pain  Visit Diagnosis: Chronic low back pain with sciatica, sciatica laterality unspecified,  unspecified back pain laterality  Stiffness of left hip, not elsewhere classified  Stiffness of right hip, not elsewhere classified  Muscle weakness (generalized)     Problem List Patient Active Problem List   Diagnosis Date Noted   Chronic respiratory failure with hypoxia ([place]) 02/28/2020   Major depressive disorder, recurrent episode, in full remission ([place]) 08/28/2018   Alcohol use disorder, severe, in sustained remission ([place]) 08/28/2018   Nocturnal hypoxemia due to emphysema ([place]) 05/24/2018   Panic disorder 05/16/2018   Community acquired pneumonia 07/26/2017   Coronary  artery disease involving native coronary artery of native heart without angina pectoris 11/11/2014   CN (constipation) 11/11/2014   Herpes 11/11/2014   Ejection fraction < 50% 05/30/2012   COPD with emphysema ([place]) 04/16/2012   Depression 04/16/2012    Shearon Balo PT, DPT 11/12/20 10:53 AM   [facility] [address] [place], [place], [address] Phone: [phone]   Fax:  [phone]  Name: Sayward Foutz MRN: [id number] Date of Birth: 30-Sep-1955

## 2020-11-17 ENCOUNTER — Ambulatory Visit: Payer: Medicare Other | Admitting: Physical Therapy

## 2020-11-17 ENCOUNTER — Other Ambulatory Visit: Payer: Self-pay

## 2020-11-17 ENCOUNTER — Encounter: Payer: Self-pay | Admitting: Physical Therapy

## 2020-11-17 DIAGNOSIS — M6281 Muscle weakness (generalized): Secondary | ICD-10-CM

## 2020-11-17 DIAGNOSIS — G8929 Other chronic pain: Secondary | ICD-10-CM

## 2020-11-17 DIAGNOSIS — M25651 Stiffness of right hip, not elsewhere classified: Secondary | ICD-10-CM

## 2020-11-17 DIAGNOSIS — M5440 Lumbago with sciatica, unspecified side: Secondary | ICD-10-CM

## 2020-11-17 DIAGNOSIS — M25652 Stiffness of left hip, not elsewhere classified: Secondary | ICD-10-CM

## 2020-11-17 DIAGNOSIS — J432 Centrilobular emphysema: Secondary | ICD-10-CM

## 2020-11-17 NOTE — Therapy (Addendum)
[place], [place], [address] Phone: [phone]   Fax:  [phone]  PHYSICAL THERAPY UNPLANNED DISCHARGE SUMMARY   Visits from Start of Care: 5  Current functional level related to goals / functional outcomes: Current status unknown   Remaining deficits: Current status unknown   Education / Equipment: Pt has not returned since visit listed below  Patient goals were not assessed. Patient is being discharged due to not returning since the last visit.  (the below note was addended to include the above D/C summary on 12/08/20)   Physical Therapy Treatment  Patient Details  Name: Brandy Russell MRN: [id number] Date of Birth: 02-23-1956 Referring Provider (PT): Eunice Blase, MD   Encounter Date: 11/17/2020   PT End of Session - 11/17/20 1000     Visit Number 5    Number of Visits 13    Date for PT Re-Evaluation 12/04/20    Authorization Type [employer] MCR, FOTO 6th/10th, KX starting at visit 15    PT Start Time 1000    PT Stop Time 1043    PT Time Calculation (min) 43 min    Activity Tolerance Patient tolerated treatment well    Behavior During Therapy WFL for tasks assessed/performed             Past Medical History:  Diagnosis Date   Allergy    Anxiety and depression    CAD (coronary artery disease)    incidental finding on CT scan for lung ca screening   Constipation    Depression    Emphysema    Emphysema of lung (HCC)    GERD (gastroesophageal reflux disease)    Herpes    face, uses suppresive therapy intermittently   Seasonal allergies     Past Surgical History:  Procedure Laterality Date   COLONOSCOPY     DILATION AND CURETTAGE OF UTERUS     POLYPECTOMY     TONSILLECTOMY AND ADENOIDECTOMY  1962    There were no vitals filed for this visit.   Subjective Assessment - 11/17/20 1005     Subjective Pt reports that overall she is doing well.  She was somewhat sore from  last session but "not too bad".  0/10 bil hip pain currently.  Aggs:  lying on side  Eases: topical cream    Pertinent History Anx/dep, COPD, GERD, CAD    Limitations Reading;Other (comment)   Supine, side lying.   Patient Stated Goals To be able to read in bed without numbness, pain.             [facility] Adult PT Treatment/Exercise:   Therapeutic Exercise to strengthen hips and core to reduce hip and low back pain: - Nu step - L5 5' - LTR 20x - Supine alternating clamshell - 3x10 ea- Black TB alternating - glute bridge with Black TB for ER cue - 3x10 - hip adduction piliates squeeze - supine hooklying - 5'' 2x8 - Alternating SLR - with ankles on foam roller - 1# - 2x10 ea - Dead bug - 3x10 (advanced)    PT Short Term Goals - 11/12/20 [id number]       PT SHORT TERM GOAL #1   Title Patient will be independent with initial HEP for symptom management.    Baseline no exercise program    Status Achieved    Target Date 11/04/20      PT SHORT TERM GOAL #2   Title Patient education for proper body mechanics to  improve daily activity without increasing pain.    Baseline Limited knowledge.    Status Achieved    Target Date 11/11/20               PT Long Term Goals - 10/21/20 1402       PT LONG TERM GOAL #1   Title Patient will be independent with final HEP and progression to continue to reduce pain and symptoms after discharge.    Baseline no exercise program, limited activity secondary COVID pandemic    Status New    Target Date 12/04/20      PT LONG TERM GOAL #2   Title FOTO score will increase to 68% as predicted for improved perception of functional abilities.    Baseline 62    Status New    Target Date 12/04/20      PT LONG TERM GOAL #3   Title Patient will demonstrate proper body mechanics for lifting 8# object from floor to table with pain no more than 2/10 to represent household chores.    Baseline Self limited lifting from floor    Status New    Target Date  12/04/20      PT LONG TERM GOAL #4   Title Patient will improve B hamstring length to >60deg to improve ability to bend with less difficulty during household tasks.    Baseline R=45deg L=50deg    Status New    Target Date 12/04/20                   Plan - 11/17/20 1022     Clinical Impression Statement Pt is progressing well with thearpy.  Showing lower baseline pain and increased function at home.  She shows reduced fatigue with familiar exercises, showing gains in strength and/or endurance.  Continue POC with plans to D/C on schedule with transition to [facility] rehab.    Personal Factors and Comorbidities Time since onset of injury/illness/exacerbation;Comorbidity 3+    Comorbidities COPD with chronic cough on 2L O2 at night and as needed during the day, anx/dep, CAD    Examination-Activity Limitations Bed Mobility;Bend;Squat;Stairs;Transfers    Examination-Participation Restrictions Cleaning;Laundry    Stability/Clinical Decision Making Stable/Uncomplicated    Rehab Potential Good    PT Frequency 2x / week    PT Duration 6 weeks    PT Treatment/Interventions ADLs/Self Care Home Management;Cryotherapy;Aquatic Therapy;Moist Heat;Stair training;Functional mobility training;Therapeutic activities;Therapeutic exercise;Neuromuscular re-education;Patient/family education;Manual techniques;Passive range of motion;Dry needling;Vasopneumatic Device    PT Next Visit Plan Assess results of HEP and progress as appropriate, assess balance?, core/LE strength. May need wedge for supine. Give patient hamstring stretch for home.    PT Home Exercise Plan [id number]    Consulted and Agree with Plan of Care Patient             Patient will benefit from skilled therapeutic intervention in order to improve the following deficits and impairments:  Abnormal gait, Decreased endurance, Decreased mobility, Difficulty walking, Hypomobility, Cardiopulmonary status limiting activity, Decreased range of  motion, Improper body mechanics, Decreased activity tolerance, Decreased strength, Impaired flexibility, Pain  Visit Diagnosis: Chronic low back pain with sciatica, sciatica laterality unspecified, unspecified back pain laterality  Stiffness of left hip, not elsewhere classified  Stiffness of right hip, not elsewhere classified  Muscle weakness (generalized)     Problem List Patient Active Problem List   Diagnosis Date Noted   Chronic respiratory failure with hypoxia ([place]) 02/28/2020   Major depressive disorder, recurrent episode, in full remission ([place]) 08/28/2018  Alcohol use disorder, severe, in sustained remission ([place]) 08/28/2018   Nocturnal hypoxemia due to emphysema ([place]) 05/24/2018   Panic disorder 05/16/2018   Community acquired pneumonia 07/26/2017   Coronary artery disease involving native coronary artery of native heart without angina pectoris 11/11/2014   CN (constipation) 11/11/2014   Herpes 11/11/2014   Ejection fraction < 50% 05/30/2012   COPD with emphysema ([place]) 04/16/2012   Depression 04/16/2012    Shearon Balo PT, DPT 11/17/20 10:41 AM  [facility] [address] [place], [place], [address] Phone: [phone]   Fax:  [phone]  Name: Brandy Russell MRN: [id number] Date of Birth: 1955-05-10

## 2020-11-17 NOTE — Telephone Encounter (Signed)
Dr. Sood, please see mychart message sent by pt and advise. °

## 2020-11-19 ENCOUNTER — Ambulatory Visit: Payer: Medicare Other | Admitting: Physical Therapy

## 2020-11-19 ENCOUNTER — Encounter (HOSPITAL_COMMUNITY): Payer: Self-pay | Admitting: *Deleted

## 2020-11-19 NOTE — Progress Notes (Signed)
Received referral from Dr. Halford Chessman for this pt to participate in pulmonary rehab with the the diagnosis of Centrolobular Emphysema. Clinical review of pt follow up appt on  07/15/20 Pulmonary office note.   Pt is completing physical therapy due to lower back pain suspect lumbar spinal stenosis.Pt with Covid Risk Score - 3. Pt appropriate for scheduling for Pulmonary rehab.  Will forward to support staff for scheduling and verification of insurance eligibility/benefits with pt consent. Cherre Huger, BSN Cardiac and Training and development officer

## 2020-11-24 ENCOUNTER — Ambulatory Visit: Payer: Medicare Other | Admitting: Physical Therapy

## 2020-12-15 NOTE — Telephone Encounter (Signed)
Hello Dr. Halford Chessman, please see mychart message below, thanks  I am having my 1st cataract surgery next Tuesday.  Sept. 27th.  I have requested Oxygen during surgery.     I am concerned about my continuous cough.  The [place].... something pill  you sent me last time did well during the days I took it.    I have an appt to see you on Oct. 12th.   Thanks. Brandy Russell

## 2020-12-16 MED ORDER — BENZONATATE 100 MG PO CAPS: 200.0000 mg | ORAL_CAPSULE | Freq: Three times a day (TID) | ORAL | 1 refills | Status: DC | PRN

## 2020-12-16 NOTE — Telephone Encounter (Signed)
I have sent prescription for benzonatate 200 mg every 8 hours as needed for cough.

## 2020-12-29 ENCOUNTER — Other Ambulatory Visit: Payer: Self-pay | Admitting: *Deleted

## 2020-12-29 DIAGNOSIS — Z87891 Personal history of nicotine dependence: Secondary | ICD-10-CM

## 2020-12-31 NOTE — Telephone Encounter (Signed)
Mychart message sent by pt which is posted below: [facility] (supporting Chesley Mires, MD) 14 hours ago (7:09 PM)   I got a call that someone wanted to schedule another lung scan before my next appt. with you.  Oct. 12th.   I am sorry, I can't remember the name or which phone # was used, but I have not heard back about the day or time for the scan.   Thanks, Talia   PCCs, please advise.

## 2021-01-01 ENCOUNTER — Ambulatory Visit: Payer: Medicare Other

## 2021-01-01 ENCOUNTER — Other Ambulatory Visit: Payer: Self-pay | Admitting: *Deleted

## 2021-01-01 ENCOUNTER — Telehealth (HOSPITAL_COMMUNITY): Payer: Self-pay

## 2021-01-01 ENCOUNTER — Ambulatory Visit (HOSPITAL_BASED_OUTPATIENT_CLINIC_OR_DEPARTMENT_OTHER): Payer: Medicare Other

## 2021-01-01 MED ORDER — ALBUTEROL SULFATE HFA 108 (90 BASE) MCG/ACT IN AERS: 2.0000 | INHALATION_SPRAY | Freq: Four times a day (QID) | RESPIRATORY_TRACT | 1 refills | Status: DC | PRN

## 2021-01-01 NOTE — Telephone Encounter (Signed)
Called patient to see if she is interested in the Pulmonary Rehab Program. Patient expressed interest. Explained scheduling process and went over insurance, patient verbalized understanding. Also adv pt where we are with scheduling for PR and that we have a backlog of 1-4 months. 

## 2021-01-01 NOTE — Telephone Encounter (Signed)
I spoke with Brandy Russell yesterday and her LCS CT has been scheduled for today 01/01/21 @ 4:30pm at [facility] and she is aware

## 2021-01-01 NOTE — Telephone Encounter (Signed)
*  CORRECT INSURANCE*   Pt insurance is active and benefits verified through [facility] Medicare. Co-pay $20.00, DED $0.00/$0.00 met, out of pocket $3,600.00/176.15 met, co-insurance 0%. No pre-authorization required. [place] Medicare, 12/31/20 @ 11:48AM, FQH#[id number]

## 2021-01-01 NOTE — Telephone Encounter (Signed)
Pt insurance is active and benefits verified through [facility] Medicare. Co-pay $20.00, DED $0.00/$0.00 met, out of pocket $5,500.00/$309.20 met, co-insurance 0%. No pre-authorization required. [place] @ 12/31/20 @ 11:48AM, ZCK#[id number]   Will contact patient to see if she is interested in the Pulmonary Rehab Program.

## 2021-01-04 ENCOUNTER — Other Ambulatory Visit: Payer: Self-pay

## 2021-01-04 ENCOUNTER — Ambulatory Visit (HOSPITAL_BASED_OUTPATIENT_CLINIC_OR_DEPARTMENT_OTHER)
Admission: RE | Admit: 2021-01-04 | Discharge: 2021-01-04 | Disposition: A | Discharge status: Home / Self Care | Payer: Medicare Other | Source: Ambulatory Visit | Attending: Acute Care | Admitting: Acute Care

## 2021-01-04 DIAGNOSIS — Z87891 Personal history of nicotine dependence: Secondary | ICD-10-CM | POA: Insufficient documentation

## 2021-01-06 ENCOUNTER — Ambulatory Visit (INDEPENDENT_AMBULATORY_CARE_PROVIDER_SITE_OTHER): Payer: Medicare Other | Admitting: Pulmonary Disease

## 2021-01-06 ENCOUNTER — Encounter: Payer: Self-pay | Admitting: Pulmonary Disease

## 2021-01-06 ENCOUNTER — Other Ambulatory Visit: Payer: Self-pay

## 2021-01-06 VITALS — BP 126/78 | HR 88 | Temp 97.7°F | Ht 66.0 in | Wt 121.4 lb

## 2021-01-06 DIAGNOSIS — J441 Chronic obstructive pulmonary disease with (acute) exacerbation: Secondary | ICD-10-CM

## 2021-01-06 DIAGNOSIS — J9611 Chronic respiratory failure with hypoxia: Secondary | ICD-10-CM

## 2021-01-06 DIAGNOSIS — J432 Centrilobular emphysema: Secondary | ICD-10-CM

## 2021-01-06 MED ORDER — PREDNISONE 10 MG PO TABS: ORAL_TABLET | ORAL | 0 refills | Status: DC

## 2021-01-06 MED ORDER — AZITHROMYCIN 250 MG PO TABS: ORAL_TABLET | ORAL | 0 refills | Status: AC

## 2021-01-06 NOTE — Progress Notes (Signed)
[facility]  Chief Complaint  Patient presents with   Follow-up    Sob getting worse Ct results    Constitutional:  BP 126/78 (BP Location: Left Arm, Cuff Size: Normal)   Pulse 88   Temp 97.7 F (36.5 C) (Oral)   Ht 5\' 6"  (1.676 m)   Wt 121 lb 6.4 oz (55.1 kg)   SpO2 97%   BMI 19.59 kg/m   Past Medical History:  Anxiety, Depression, CAD, GERD, PNA  Past Surgical History:  She  has a past surgical history that includes Tonsillectomy and adenoidectomy (1962); Dilation and curettage of uterus; Colonoscopy; and Polypectomy.  Brief Summary:  Brandy Russell is a 65 y.o. 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       Subjective:   She had low dose CT chest this weekend.  Results still pending.  She has more cough and chest congestion.  Bringing up phlegm.  Has soreness in Lt lower chest.  Feels more fatigued.  She feels like something has changed with her breathing.  No fever, hemoptysis or swelling.  Uses oxygen intermittently when out.  She still is self conscious of how others perceive her if she uses oxygen.   Physical Exam:   Appearance - well kempt   ENMT - no sinus tenderness, no oral exudate, no LAN, Mallampati 2 airway, no stridor  Respiratory - decreased breath sounds bilaterally, rhonchi at Lt base  CV - s1s2 regular rate and rhythm, no murmurs  Ext - no clubbing, no edema  Skin - no rashes  Psych - normal mood and affect   Pulmonary testing:  Spirometry 05/30/12 >> FEV1 0.69 (26%), FEV1% 40 A1AT 05/30/12 >> 112, PI*MS ONO with RA 07/03/12 >> Test time 8 hrs 33 min.  Mean SpO2 92.6%, low SpO2 87%.  Spent 8 min with SpO2 < 88%. Sputum 07/23/15 >> Pseudomonas aeruginosa  Chest Imaging:  CT chest 04/04/09 >> apical centrilobular emphysema, 3 mm RUL nodule, 4 mm RUL nodule Low dose CT chest 10/03/14 >> multiple pulmonary nodules up to 5 mm, mild centrilobular and paraseptal emphysema Low dose CT chest  10/06/15 >> atherosclerosis, mod centrilobular emphysema, bronchial wall thickening, scattered nodules up to 3.6 mm Esophagram 11/25/15 >> no specific abnormality Low dose CT chest 10/06/16 >> no change Low dose CT chest 03/23/18 >> atherosclerosis, multiple nodules with new nodule 7.7 mm RUL, centrilobular and paraseptal emphysema, 3 cm lesion in left lobe of liver, possible dilated bile duct LDCT chest 08/24/18 >> atherosclerosis, numerous tiny b/l nodules up to 7.7 mm, centrilobular emphysema, 2.8 cm density in liver, 5 mm stone in Lt kidney LDCT chest 08/27/19 >> new RUL nodule 7 mm, diffuse bronchial thickening, mod centrilobular and paraseptal emphysema, other nodules stable  LDCT 12/04/19 >> no change RUL nodule, RML nodule resolve LDCT 01/04/21 >>   Sleep Tests:  ONO with RA 03/13/18 >> test time 9 hrs 45 min.  Baseline SpO2 88%, low SpO2 81%.  Spent 5 hrs 57 min with SpO2 < 88%. ONO with RA 05/09/18 >> test time 10 hrs 1 min.  Basal SpO2 89.8%, low SpO2 65%.  Spent 202.8 min with SpO2 < 88%. ONO with 2 liters 06/12/18 >> test time 8 hrs 6 min.  Basal SpO2 97.5%, SpO2 low 92%.  Cardiac Tests:  Echo 02/08/05 >> EF 40 to 50% Echo 03/30/18 >> EF 55 to 60%, grade 2 DD  Social History:  She  reports that she quit  smoking about 14 years ago. Her smoking use included cigarettes. She has a 70.00 pack-year smoking history. She has never used smokeless tobacco. She reports that she does not drink alcohol and does not use drugs.  Family History:  Her family history includes Alcohol abuse in her father and mother; Emphysema in her father and mother; Hypertension in her father; Lung cancer in her father; Melanoma in her brother; Uterine cancer in her mother.     Assessment/Plan:   COPD with emphysema. - she has an exacerbation - will give course of zithromax and prednisone - continue trelegy - prn albuterol   Nocturnal hypoxemia from emphysema. - 2 liters oxygen with exertion and sleep   Lung  cancer screening. - will call her with results of LDCT from 01/04/21 are available  Major depression. - f/u with Dr. Montel Culver   Coronary atherosclerosis, diastolic CHF. - followed by Dr. Jenkins Rouge with [facility]   Bloating with irregular bowel pattern. - likely related to prior antibiotic use - improved - advised her to use probiotic or eat yogurt when she needs to go on antibiotics again  Time Spent Involved in Patient Care on Day of Examination:  32 minutes  Follow up:   Patient Instructions  Zithromax 250 mg pill >> 2 pills on day 1, then 1 pill daily for next 4 days  Prednisone 10 mg pill >> 3 pills for 2 days, 2 pills for 2 days, 1 pill for 2 days  Follow up in 3 months  Medication List:   Allergies as of 01/06/2021       Reactions   Levaquin [levofloxacin] Other (See Comments)   tendonitis   Penicillins Swelling   Did it involve swelling of the face/tongue/throat, SOB, or low BP? Unknown Did it involve sudden or severe rash/hives, skin peeling, or any reaction on the inside of your mouth or nose? Unknown Did you need to seek medical attention at a hospital or doctor's office? Unknown When did it last happen?  young child     If all above answers are "NO", may proceed with cephalosporin use.   Sulfa Antibiotics Nausea And Vomiting        Medication List        Accurate as of January 06, 2021  9:41 AM. If you have any questions, ask your nurse or doctor.          albuterol 108 (90 Base) MCG/ACT inhaler Commonly known as: VENTOLIN HFA Inhale 2 puffs into the lungs every 6 (six) hours as needed for wheezing or shortness of breath.   azithromycin 250 MG tablet Commonly known as: ZITHROMAX Take 2 tablets (500 mg total) by mouth daily for 1 day, THEN 1 tablet (250 mg total) daily for 4 days. Start taking on: January 06, 2021 What changed: See the new instructions. Changed by: Chesley Mires, MD   benzonatate 100 MG capsule Commonly known as:  TESSALON Take 2 capsules (200 mg total) by mouth 3 (three) times daily as needed for cough.   buPROPion 150 MG 12 hr tablet Commonly known as: WELLBUTRIN SR Take 1 tablet (150 mg total) by mouth 2 (two) times daily.   diclofenac sodium 1 % Gel Commonly known as: VOLTAREN Apply 1 application topically daily as needed (pain).   FIBER PO Take 1 tablet by mouth 2 (two) times a day.   gabapentin 100 MG capsule Commonly known as: NEURONTIN Take 1 capsule (100 mg total) by mouth 3 (three) times daily.  ibuprofen 200 MG tablet Commonly known as: ADVIL Take 400 mg by mouth every 6 (six) hours as needed for headache (pain).   pravastatin 40 MG tablet Commonly known as: PRAVACHOL Take 40 mg by mouth daily.   predniSONE 10 MG tablet Commonly known as: DELTASONE Take 3 tablets (30 mg total) by mouth daily with breakfast for 2 days, THEN 2 tablets (20 mg total) daily with breakfast for 2 days, THEN 1 tablet (10 mg total) daily with breakfast for 2 days. Start taking on: January 06, 2021 Started by: Chesley Mires, MD   Trelegy Ellipta 100-62.5-25 MCG/INH Aepb Generic drug: Fluticasone-Umeclidin-Vilant Inhale 1 puff into the lungs daily.   Trelegy Ellipta 100-62.5-25 MCG/INH Aepb Generic drug: Fluticasone-Umeclidin-Vilant Inhale 1 puff into the lungs daily.        Signature:  Chesley Mires, MD [place] Pager - [phone] 01/06/2021, 9:41 AM

## 2021-01-06 NOTE — Patient Instructions (Signed)
Zithromax 250 mg pill >> 2 pills on day 1, then 1 pill daily for next 4 days  Prednisone 10 mg pill >> 3 pills for 2 days, 2 pills for 2 days, 1 pill for 2 days  Follow up in 3 months

## 2021-01-07 NOTE — Progress Notes (Signed)
These results were called to patient by Dr. Halford Chessman. He has ordered antibiotics and prednisone. She will need a repeat regular CT Chest without in 12  months to re-evaluate. She will get a CXR in the interim. If CXR is abnormal, we can do a CT prior to 12 months. Pt. Is in agreement with this plan.

## 2021-01-08 NOTE — Telephone Encounter (Signed)
Call made to patient, confirmed DOB. Patient states her PCP saw the report and he went ahead and prescribed Zpack and prednisone. She is currently on her 2nd day. She was feeling bad and did not think anything of it. She was concerned about the cough and thought it was just a common cold since the weather is changing. Made aware of results per SG. Voiced understanding. She will be seeing her PCP next week for F/U.   Nothing further needed at this time.

## 2021-01-11 NOTE — Telephone Encounter (Signed)
Dr. Sood, please see mychart message sent by pt and advise. °

## 2021-01-12 MED ORDER — PREDNISONE 10 MG PO TABS: ORAL_TABLET | ORAL | 0 refills | Status: AC

## 2021-01-12 NOTE — Telephone Encounter (Signed)
Please let her know I have sent a script for another course of prednisone.

## 2021-01-14 ENCOUNTER — Other Ambulatory Visit: Payer: Self-pay | Admitting: Internal Medicine

## 2021-01-14 ENCOUNTER — Other Ambulatory Visit: Payer: Self-pay | Admitting: *Deleted

## 2021-01-14 ENCOUNTER — Other Ambulatory Visit (HOSPITAL_COMMUNITY): Payer: Self-pay

## 2021-01-14 DIAGNOSIS — Z87891 Personal history of nicotine dependence: Secondary | ICD-10-CM

## 2021-01-14 DIAGNOSIS — Z1231 Encounter for screening mammogram for malignant neoplasm of breast: Secondary | ICD-10-CM

## 2021-01-19 ENCOUNTER — Encounter: Payer: Self-pay | Admitting: Adult Health

## 2021-01-19 ENCOUNTER — Telehealth: Payer: Self-pay | Admitting: Adult Health

## 2021-01-19 ENCOUNTER — Ambulatory Visit (INDEPENDENT_AMBULATORY_CARE_PROVIDER_SITE_OTHER): Payer: Medicare Other

## 2021-01-19 ENCOUNTER — Other Ambulatory Visit: Payer: Self-pay

## 2021-01-19 ENCOUNTER — Telehealth: Payer: Self-pay

## 2021-01-19 ENCOUNTER — Telehealth: Payer: Self-pay | Admitting: Pulmonary Disease

## 2021-01-19 ENCOUNTER — Ambulatory Visit (INDEPENDENT_AMBULATORY_CARE_PROVIDER_SITE_OTHER): Payer: Medicare Other | Admitting: Adult Health

## 2021-01-19 VITALS — BP 124/60 | HR 119 | Temp 98.5°F | Ht 66.0 in | Wt 123.2 lb

## 2021-01-19 DIAGNOSIS — J439 Emphysema, unspecified: Secondary | ICD-10-CM

## 2021-01-19 DIAGNOSIS — J441 Chronic obstructive pulmonary disease with (acute) exacerbation: Secondary | ICD-10-CM

## 2021-01-19 DIAGNOSIS — J189 Pneumonia, unspecified organism: Secondary | ICD-10-CM

## 2021-01-19 DIAGNOSIS — J9611 Chronic respiratory failure with hypoxia: Secondary | ICD-10-CM | POA: Diagnosis not present

## 2021-01-19 DIAGNOSIS — J449 Chronic obstructive pulmonary disease, unspecified: Secondary | ICD-10-CM

## 2021-01-19 MED ORDER — PREDNISONE 10 MG PO TABS: ORAL_TABLET | ORAL | 0 refills | Status: DC

## 2021-01-19 MED ORDER — ALBUTEROL SULFATE HFA 108 (90 BASE) MCG/ACT IN AERS: 2.0000 | INHALATION_SPRAY | Freq: Four times a day (QID) | RESPIRATORY_TRACT | 1 refills | Status: DC | PRN

## 2021-01-19 MED ORDER — DOXYCYCLINE HYCLATE 100 MG PO TABS: 100.0000 mg | ORAL_TABLET | Freq: Two times a day (BID) | ORAL | 0 refills | Status: DC

## 2021-01-19 MED ORDER — METHYLPREDNISOLONE ACETATE 80 MG/ML IJ SUSP
80.0000 mg | Freq: Once | INTRAMUSCULAR | Status: AC
  Administered 2021-01-19: 80 mg via INTRAMUSCULAR

## 2021-01-19 MED ORDER — ALBUTEROL SULFATE (2.5 MG/3ML) 0.083% IN NEBU: 2.5000 mg | INHALATION_SOLUTION | Freq: Four times a day (QID) | RESPIRATORY_TRACT | 3 refills | Status: DC | PRN

## 2021-01-19 NOTE — Telephone Encounter (Signed)
Can send script for doxycycline.  She needs appointment at some point this week with a chest xray.

## 2021-01-19 NOTE — Assessment & Plan Note (Signed)
Slow to resolve COPD exacerbation.  Check COVID-19 test   Plan  Patient Instructions  Doxycycline 100mg  Twice daily  for 7 days , take with food.  Prednisone taper over next week.  Mucinex DM Twice daily  As needed  cough/congestion  Albuterol inhaler As needed   Tylenol As needed  fever .  Covid 19 test today , call if positive.  Follow up with 1 week and As needed   Please contact office for sooner follow up if symptoms do not improve or worsen or seek emergency care

## 2021-01-19 NOTE — Assessment & Plan Note (Signed)
Continue on oxygen at 2 L ?

## 2021-01-19 NOTE — Telephone Encounter (Signed)
-----   Message from Luciano Cutter, CPhT sent at 01/14/2021  2:10 PM EDT ----- Regarding: MEDICATION NOT COVERED Received "Medical Clarification Request" Albuterol HFA (VENTOLIN) is NOT COVERED. Please send new prescription for Albuterol HFA (PROAIR HFA) or Albuterol (PROVENTIL HFA)

## 2021-01-19 NOTE — Patient Instructions (Addendum)
Doxycycline 100mg  Twice daily  for 7 days , take with food.  Prednisone taper over next week.  Mucinex DM Twice daily  As needed  cough/congestion  Albuterol inhaler As needed   Tylenol As needed  fever .  Covid 19 test today , call if positive.  Follow up with 1 week and As needed   Please contact office for sooner follow up if symptoms do not improve or worsen or seek emergency care

## 2021-01-19 NOTE — Telephone Encounter (Signed)
New RX has been sent with a note to the pharmacy to process order for ProAir or Proventil due to insurance.   Will close encounter.

## 2021-01-19 NOTE — Telephone Encounter (Signed)
Zpak and prednisone prescribed on 01/06/2021. Another round of prednisone on 01/11/2021. She did not complete second course of prednisone due to upcoming surgery.   Patient stated that she completed course of prednisone and zpak with mild relief in sx.  C/o increased sob with exertion, increased fatigued and temp of 100.4 and non prod cough.  Cough and sob has been present for months. Fever and fatigued developed yesterday.  She was scheduled for surgery today and had postpone due to fever. She wears 2L cont. Not monitoring spo2 She is using albuterol HFA BID and trelegy once daily.  2 covid vaccines.  No recent covid test.    Dr. Halford Chessman, please advise. Thanks.

## 2021-01-19 NOTE — Telephone Encounter (Signed)
I called the patient and she was appreciative of the call back and she has taken some Tylenol. She reports that the breathing treatment was helpful/ Nothing further needed.

## 2021-01-19 NOTE — Progress Notes (Signed)
@Patient  ID: Brandy Russell, female    DOB: 31-Jan-1956, 65 y.o.   MRN: [id number]  Chief Complaint  Patient presents with   Acute Visit    Referring provider: Haywood Pao, MD  HPI: 65 year old female former smoker followed for COPD, lung nodule, chronic hypoxic respiratory failure on oxygen  TEST/EVENTS :  Spirometry 05/30/12 >> FEV1 0.69 (26%), FEV1% 40 A1AT 05/30/12 >> 112, PI*MS ONO with RA 07/03/12 >> Test time 8 hrs 33 min.  Mean SpO2 92.6%, low SpO2 87%.  Spent 8 min with SpO2 < 88%. Sputum 07/23/15 >> Pseudomonas aeruginosa ONO with RA 03/13/18 >> test time 9 hrs 45 min.  Baseline SpO2 88%, low SpO2 81%.  Spent 5 hrs 57 min with SpO2 < 88%. ONO with RA 05/09/18 >> test time 10 hrs 1 min.  Basal SpO2 89.8%, low SpO2 65%.  Spent 202.8 min with SpO2 < 88%. ONO with 2 liters 06/12/18 >> test time 8 hrs 6 min.  Basal SpO2 97.5%, SpO2 low 92%.   Chest imaging:  CT chest 04/04/09 >> apical centrilobular emphysema, 3 mm RUL nodule, 4 mm RUL nodule Low dose CT chest 10/03/14 >> multiple pulmonary nodules up to 5 mm, mild centrilobular and paraseptal emphysema Low dose CT chest 10/06/15 >> atherosclerosis, mod centrilobular emphysema, bronchial wall thickening, scattered nodules up to 3.6 mm Esophagram 11/25/15 >> no specific abnormality Low dose CT chest 10/06/16 >> no change Low dose CT chest 03/23/18 >> atherosclerosis, multiple nodules with new nodule 7.7 mm RUL, centrilobular and paraseptal emphysema, 3 cm lesion in left lobe of liver, possible dilated bile duct LDCT chest 08/24/18 >> atherosclerosis, numerous tiny b/l nodules up to 7.7 mm, centrilobular emphysema, 2.8 cm density in liver, 5 mm stone in Lt kidney LDCT chest 08/27/19 >> new RUL nodule 7 mm, diffuse bronchial thickening, mod centrilobular and paraseptal emphysema, other nodules stable   Cardiac tests:  Echo 02/08/05 >> EF 40 to 50% Echo 03/30/18 >> EF 55 to 60%, grade 2 DD   01/19/2021 Follow up ; COPD , O2 RF   Patient presents for an acute office visit.  Patient returns with ongoing symptoms over the last 2 to 3 weeks.  Patient was seen in the office 2 weeks ago with increased cough congestion.  CT chest on January 04, 2021 showed moderate emphysema with left lower lobe patchy opacity and interstitial thickening.Patient was treated with a Z-Pak and a prednisone taper. Since last visit patient says she is not feeling much better.  She continues to have ongoing cough and wheezing.  She remains on oxygen at 2 L.  She is on Trelegy inhaler daily. Vaccinated for Covid 19 x 2 only . Has not checked for Covid 19 .  She denies any hemoptysis, chest pain, orthopnea, edema Chest x-ray today shows diffuse bilateral interstitial opacity   Allergies  Allergen Reactions   Levaquin [Levofloxacin] Other (See Comments)    tendonitis   Penicillins Swelling     Did it involve swelling of the face/tongue/throat, SOB, or low BP? Unknown Did it involve sudden or severe rash/hives, skin peeling, or any reaction on the inside of your mouth or nose? Unknown Did you need to seek medical attention at a hospital or doctor's office? Unknown When did it last happen?  young child     If all above answers are "NO", may proceed with cephalosporin use.    Sulfa Antibiotics Nausea And Vomiting    Immunization History  Administered Date(s) Administered  Fluad Quad(high Dose 65+) 12/16/2019   Influenza Split 12/26/2016, 01/02/2018, 11/27/2018   Influenza Whole 12/27/2011, 12/04/2012   Influenza,inj,Quad PF,6+ Mos 11/27/2014, 12/27/2015, 12/17/2018   Influenza-Unspecified 12/09/2013   PFIZER(Purple Top)SARS-COV-2 Vaccination 06/06/2019, 06/27/2019, 03/13/2020   Pneumococcal Conjugate-13 11/17/2015   Pneumococcal Polysaccharide-23 03/28/2005, 01/09/2013   Zoster, Live 09/26/2014    Past Medical History:  Diagnosis Date   Allergy    Anxiety and depression    CAD (coronary artery disease)    incidental finding on CT  scan for lung ca screening   Constipation    Depression    Emphysema    Emphysema of lung (HCC)    GERD (gastroesophageal reflux disease)    Herpes    face, uses suppresive therapy intermittently   Seasonal allergies     Tobacco History: Social History   Tobacco Use  Smoking Status Former   Packs/day: 2.00   Years: 35.00   Pack years: 70.00   Types: Cigarettes   Quit date: 03/28/2006   Years since quitting: 14.8  Smokeless Tobacco Never   Counseling given: Not Answered   Outpatient Medications Prior to Visit  Medication Sig Dispense Refill   acetaminophen (TYLENOL) 325 MG tablet Take 325 mg by mouth every 6 (six) hours as needed.     albuterol (VENTOLIN HFA) 108 (90 Base) MCG/ACT inhaler Inhale 2 puffs into the lungs every 6 (six) hours as needed for wheezing or shortness of breath. 20.1 g 1   benzonatate (TESSALON) 100 MG capsule Take 2 capsules (200 mg total) by mouth 3 (three) times daily as needed for cough. 30 capsule 1   diclofenac sodium (VOLTAREN) 1 % GEL Apply 1 application topically daily as needed (pain).   2   FIBER PO Take 1 tablet by mouth 2 (two) times a day.     Fluticasone-Umeclidin-Vilant (TRELEGY ELLIPTA) 100-62.5-25 MCG/INH AEPB Inhale 1 puff into the lungs daily. 60 each 11   ibuprofen (ADVIL,MOTRIN) 200 MG tablet Take 400 mg by mouth every 6 (six) hours as needed for headache (pain).      omeprazole (PRILOSEC) 20 MG capsule Take 20 mg by mouth daily.     pravastatin (PRAVACHOL) 40 MG tablet Take 40 mg by mouth daily.     buPROPion (WELLBUTRIN SR) 150 MG 12 hr tablet Take 1 tablet (150 mg total) by mouth 2 (two) times daily. 180 tablet 1   gabapentin (NEURONTIN) 100 MG capsule Take 1 capsule (100 mg total) by mouth 3 (three) times daily. 270 capsule 0   Fluticasone-Umeclidin-Vilant (TRELEGY ELLIPTA) 100-62.5-25 MCG/INH AEPB Inhale 1 puff into the lungs daily. (Patient not taking: No sig reported) 28 each 0   No facility-administered medications prior to  visit.     Review of Systems:   Constitutional:   No  weight loss, night sweats,  Fevers, chills,  +fatigue, or  lassitude.  HEENT:   No headaches,  Difficulty swallowing,  Tooth/dental problems, or  Sore throat,                No sneezing, itching, ear ache, + nasal congestion, post nasal drip,   CV:  No chest pain,  Orthopnea, PND, swelling in lower extremities, anasarca, dizziness, palpitations, syncope.   GI  No heartburn, indigestion, abdominal pain, nausea, vomiting, diarrhea, change in bowel habits, loss of appetite, bloody stools.   Resp: .  No chest wall deformity  Skin: no rash or lesions.  GU: no dysuria, change in color of urine, no urgency or frequency.  No  flank pain, no hematuria   MS:  No joint pain or swelling.  No decreased range of motion.  No back pain.    Physical Exam  BP 124/60 (BP Location: Left Arm, Patient Position: Sitting, Cuff Size: Normal)   Pulse (!) 119   Temp 98.5 F (36.9 C) (Oral)   Ht 5\' 6"  (1.676 m)   Wt 123 lb 3.2 oz (55.9 kg)   SpO2 94%   BMI 19.89 kg/m   GEN: A/Ox3; pleasant , NAD, well nourished    HEENT:  [place]/AT,  NOSE-clear, THROAT-clear, no lesions, no postnasal drip or exudate noted.   NECK:  Supple w/ fair ROM; no JVD; normal carotid impulses w/o bruits; no thyromegaly or nodules palpated; no lymphadenopathy.    RESP scattered rhonchi and a few expiratory wheezes   no accessory muscle use, no dullness to percussion  CARD:  RRR, no m/r/g, no peripheral edema, pulses intact, no cyanosis or clubbing.  GI:   Soft & nt; nml bowel sounds; no organomegaly or masses detected.   Musco: Warm bil, no deformities or joint swelling noted.   Neuro: alert, no focal deficits noted.    Skin: Warm, no lesions or rashes    Lab Results:  CBC    Component Value Date/Time   WBC 4.4 07/29/2018 1837   RBC 3.72 (L) 07/29/2018 1837   HGB 12.7 07/29/2018 1837   HCT 38.6 07/29/2018 1837   PLT 253 07/29/2018 1837   MCV 103.8 (H)  07/29/2018 1837   MCH 34.1 (H) 07/29/2018 1837   MCHC 32.9 07/29/2018 1837   RDW 12.4 07/29/2018 1837   LYMPHSABS 2.3 09/19/2013 1616   MONOABS 0.6 09/19/2013 1616   EOSABS 0.1 09/19/2013 1616   BASOSABS 0.0 09/19/2013 1616    BMET    Component Value Date/Time   NA 135 07/29/2018 1837   NA 136 05/25/2018 1027   K 4.2 07/29/2018 1837   CL 99 07/29/2018 1837   CO2 23 07/29/2018 1837   GLUCOSE 129 (H) 07/29/2018 1837   BUN 7 (L) 07/29/2018 1837   BUN 5 (L) 05/25/2018 1027   CREATININE 0.66 07/29/2018 1837   CALCIUM 9.6 07/29/2018 1837   GFRNONAA >60 07/29/2018 1837   GFRAA >60 07/29/2018 1837    BNP    Component Value Date/Time   BNP 38.5 09/24/2014 1636    ProBNP    Component Value Date/Time   PROBNP 86 05/25/2018 1027    Imaging: DG Chest 2 View  Result Date: 01/19/2021 CLINICAL DATA:  Shortness of breath, COPD exacerbation EXAM: CHEST - 2 VIEW COMPARISON:  02/28/2020 FINDINGS: The heart size and mediastinal contours are within normal limits. Pulmonary hyperinflation and emphysema. Diffuse bilateral interstitial opacity. The visualized skeletal structures are unremarkable. IMPRESSION: Pulmonary hyperinflation and emphysema with diffuse bilateral interstitial opacity, which may reflect superimposed edema or atypical/viral infection. No focal airspace opacity. Electronically Signed   By: Delanna Ahmadi M.D.   On: 01/19/2021 11:11   CT CHEST LUNG CA SCREEN LOW DOSE W/O CM  Result Date: 01/06/2021 CLINICAL DATA:  Seventy pack-year smoking history, quitting 14 years ago EXAM: CT CHEST WITHOUT CONTRAST LOW-DOSE FOR LUNG CANCER SCREENING TECHNIQUE: Multidetector CT imaging of the chest was performed following the standard protocol without IV contrast. COMPARISON:  12/04/2019 FINDINGS: Cardiovascular: Aortic atherosclerosis. Normal heart size, without pericardial effusion. Lad and right coronary artery calcification. Mediastinum/Nodes: No mediastinal or definite hilar  adenopathy, given limitations of unenhanced CT. Subtle esophageal fluid level on 30/2. Lungs/Pleura: No pleural  fluid. Moderate centrilobular emphysema. Interval left lower lobe patchy opacity and interstitial thickening, likely post infectious or inflammatory. Similar right upper lobe scarring. Biapical pleuroparenchymal scarring. Calcified and noncalcified pulmonary nodules are not significantly changed. Example at maximally volume derived equivalent diameter 7.9 mm in the right upper lobe. Upper Abdomen: Mild hepatic steatosis. Dominant left hepatic lobe 2.5 cm cyst. 6 mm interpolar left renal collecting system calculus. Normal imaged portions of the spleen, stomach, pancreas, adrenal glands, right kidney. Musculoskeletal: No acute osseous abnormality. IMPRESSION: 1. Lung-RADS 2, benign appearance or behavior. Continue annual screening with low-dose chest CT without contrast in 12 months. 2. Interval mild left lower lobe interstitial thickening and patchy airspace disease, suspicious for infection/inflammation of indeterminate acuity. 3. Esophageal air fluid level suggests dysmotility or gastroesophageal reflux. 4. Left nephrolithiasis. 5. Aortic atherosclerosis (ICD10-I70.0), coronary artery atherosclerosis and emphysema (ICD10-J43.9). These results will be called to the ordering clinician or representative by the Radiologist Assistant, and communication documented in the PACS or [employer]. Electronically Signed   By: Abigail Miyamoto M.D.   On: 01/06/2021 10:17      No flowsheet data found.  No results found for: NITRICOXIDE      Assessment & Plan:   COPD with emphysema Slow to resolve COPD exacerbation.  Check COVID-19 test   Plan  Patient Instructions  Doxycycline 100mg  Twice daily  for 7 days , take with food.  Prednisone taper over next week.  Mucinex DM Twice daily  As needed  cough/congestion  Albuterol inhaler As needed   Tylenol As needed  fever .  Covid 19 test today , call  if positive.  Follow up with 1 week and As needed   Please contact office for sooner follow up if symptoms do not improve or worsen or seek emergency care         Chronic respiratory failure with hypoxia ([place]) Continue on oxygen at 2 L     [name], NP 01/19/2021

## 2021-01-19 NOTE — Progress Notes (Signed)
Reviewed and agree with assessment/plan.   Chesley Mires, MD [facility] Pulmonary/Critical Care 01/19/2021, 4:13 PM Pager:  [phone]

## 2021-01-19 NOTE — Telephone Encounter (Signed)
Spoke with the pt and notified of response per Dr Halford Chessman  She verbalized understanding  She states that she wants the appt with cxr asap  I scheduled her with TP for 11 am today, advised to arrive for cxr at 10:30  Doxy not sent bc she is seeing TP this morning and she is aware of this

## 2021-01-25 ENCOUNTER — Telehealth: Payer: Self-pay | Admitting: Adult Health

## 2021-01-26 ENCOUNTER — Ambulatory Visit: Payer: Medicare Other | Admitting: Adult Health

## 2021-01-26 MED ORDER — PROAIR RESPICLICK 108 (90 BASE) MCG/ACT IN AEPB: 2.0000 | INHALATION_SPRAY | Freq: Four times a day (QID) | RESPIRATORY_TRACT | 1 refills | Status: AC | PRN

## 2021-01-26 NOTE — Telephone Encounter (Signed)
Called and spoke to [employer]. A script was sent to pharmacy on 01/19/21 with note to pharmacy to fill albuterol that is covered by insurance. However, a new script is needed. Gave VO for refill of pt's previous albuterol script of  [facility]iclick, $4 for 90 day. Pt's med list updated. Nothing further needed at this time.

## 2021-01-27 ENCOUNTER — Other Ambulatory Visit: Payer: Self-pay

## 2021-01-27 ENCOUNTER — Encounter: Payer: Self-pay | Admitting: Adult Health

## 2021-01-27 ENCOUNTER — Ambulatory Visit (INDEPENDENT_AMBULATORY_CARE_PROVIDER_SITE_OTHER): Payer: Medicare Other | Admitting: Adult Health

## 2021-01-27 DIAGNOSIS — B370 Candidal stomatitis: Secondary | ICD-10-CM | POA: Diagnosis not present

## 2021-01-27 DIAGNOSIS — J439 Emphysema, unspecified: Secondary | ICD-10-CM | POA: Diagnosis not present

## 2021-01-27 DIAGNOSIS — J9611 Chronic respiratory failure with hypoxia: Secondary | ICD-10-CM

## 2021-01-27 MED ORDER — CLOTRIMAZOLE 10 MG MT TROC: 10.0000 mg | Freq: Every day | OROMUCOSAL | 0 refills | Status: DC

## 2021-01-27 NOTE — Assessment & Plan Note (Signed)
Recent exacerbation-has completed 2 courses of antibiotics and steroids.  She is starting to have clinical improvement.  Chest x-ray suspicious for possible early pneumonia.  We will repeat chest x-ray on return visit in 6 to 8 weeks.  Plan  Patient Instructions  Continue on TRELEGY 1 puff daily, brush/rinse and gargle after use.  Mycelex troche five times a day for 1 week.  Mucinex DM Twice daily  As needed  cough/congestion  Albuterol inhaler and neb as needed  Follow up with Dr. Halford Chessman  as planned and As needed   Please contact office for sooner follow up if symptoms do not improve or worsen or seek emergency care

## 2021-01-27 NOTE — Progress Notes (Signed)
@Patient  ID: Brandy Russell, female    DOB: 1956/03/15, 65 y.o.   MRN: [id number]  Chief Complaint  Patient presents with   Follow-up    Referring provider: Haywood Pao, MD  HPI: 65 year old female former smoker followed for COPD, lung nodule, chronic hypoxic respiratory failure on oxygen   TEST/EVENTS :  Spirometry 05/30/12 >> FEV1 0.69 (26%), FEV1% 40 A1AT 05/30/12 >> 112, PI*MS ONO with RA 07/03/12 >> Test time 8 hrs 33 min.  Mean SpO2 92.6%, low SpO2 87%.  Spent 8 min with SpO2 < 88%. Sputum 07/23/15 >> Pseudomonas aeruginosa ONO with RA 03/13/18 >> test time 9 hrs 45 min.  Baseline SpO2 88%, low SpO2 81%.  Spent 5 hrs 57 min with SpO2 < 88%. ONO with RA 05/09/18 >> test time 10 hrs 1 min.  Basal SpO2 89.8%, low SpO2 65%.  Spent 202.8 min with SpO2 < 88%. ONO with 2 liters 06/12/18 >> test time 8 hrs 6 min.  Basal SpO2 97.5%, SpO2 low 92%.   Chest imaging:  CT chest 04/04/09 >> apical centrilobular emphysema, 3 mm RUL nodule, 4 mm RUL nodule Low dose CT chest 10/03/14 >> multiple pulmonary nodules up to 5 mm, mild centrilobular and paraseptal emphysema Low dose CT chest 10/06/15 >> atherosclerosis, mod centrilobular emphysema, bronchial wall thickening, scattered nodules up to 3.6 mm Esophagram 11/25/15 >> no specific abnormality Low dose CT chest 10/06/16 >> no change Low dose CT chest 03/23/18 >> atherosclerosis, multiple nodules with new nodule 7.7 mm RUL, centrilobular and paraseptal emphysema, 3 cm lesion in left lobe of liver, possible dilated bile duct LDCT chest 08/24/18 >> atherosclerosis, numerous tiny b/l nodules up to 7.7 mm, centrilobular emphysema, 2.8 cm density in liver, 5 mm stone in Lt kidney LDCT chest 08/27/19 >> new RUL nodule 7 mm, diffuse bronchial thickening, mod centrilobular and paraseptal emphysema, other nodules stable   Cardiac tests:  Echo 02/08/05 >> EF 40 to 50% Echo 03/30/18 >> EF 55 to 60%, grade 2 DD   01/27/2021 Follow up ; COPD and O2 RF   Patient returns for a 1 week follow-up.  Patient was seen last visit with a COPD exacerbation.  She had initially been treated with a Z-Pak and a prednisone taper.  CT chest on January 04, 2021 showed moderate emphysema and left lower lobe patchy opacity interstitial thickening.  COVID-19 home test was negative.  Chest x-ray last visit showed some increased diffuse interstitial markings.  She was treated with doxycycline and a prednisone taper.she is feeling some better. Fever has resolved . Coughing with thick mucus.  Started on Albuterol nebs last visit.  Remains on Oxygen 2l/m with activity and .At bedtime  . O2 sats in office today 91% on room air .    Allergies  Allergen Reactions   Levaquin [Levofloxacin] Other (See Comments)    tendonitis   Penicillins Swelling     Did it involve swelling of the face/tongue/throat, SOB, or low BP? Unknown Did it involve sudden or severe rash/hives, skin peeling, or any reaction on the inside of your mouth or nose? Unknown Did you need to seek medical attention at a hospital or doctor's office? Unknown When did it last happen?  young child     If all above answers are "NO", may proceed with cephalosporin use.    Sulfa Antibiotics Nausea And Vomiting    Immunization History  Administered Date(s) Administered   Fluad Quad(high Dose 65+) 12/16/2019   Influenza Split 12/26/2016, 01/02/2018,  11/27/2018   Influenza Whole 12/27/2011, 12/04/2012   Influenza,inj,Quad PF,6+ Mos 11/27/2014, 12/27/2015, 12/17/2018   Influenza-Unspecified 12/09/2013   PFIZER(Purple Top)SARS-COV-2 Vaccination 06/06/2019, 06/27/2019, 03/13/2020   Pneumococcal Conjugate-13 11/17/2015   Pneumococcal Polysaccharide-23 03/28/2005, 01/09/2013   Zoster, Live 09/26/2014    Past Medical History:  Diagnosis Date   Allergy    Anxiety and depression    CAD (coronary artery disease)    incidental finding on CT scan for lung ca screening   Constipation    Depression    Emphysema     Emphysema of lung (HCC)    GERD (gastroesophageal reflux disease)    Herpes    face, uses suppresive therapy intermittently   Seasonal allergies     Tobacco History: Social History   Tobacco Use  Smoking Status Former   Packs/day: 2.00   Years: 35.00   Pack years: 70.00   Types: Cigarettes   Quit date: 03/28/2006   Years since quitting: 14.8  Smokeless Tobacco Never   Counseling given: Not Answered   Outpatient Medications Prior to Visit  Medication Sig Dispense Refill   acetaminophen (TYLENOL) 325 MG tablet Take 325 mg by mouth every 6 (six) hours as needed.     albuterol (PROVENTIL) (2.5 MG/3ML) 0.083% nebulizer solution Take 3 mLs (2.5 mg total) by nebulization every 6 (six) hours as needed for wheezing or shortness of breath. 75 mL 3   Albuterol Sulfate (PROAIR RESPICLICK) 595 (90 Base) MCG/ACT AEPB Inhale 2 puffs into the lungs every 6 (six) hours as needed. 3 each 1   diclofenac sodium (VOLTAREN) 1 % GEL Apply 1 application topically daily as needed (pain).   2   FIBER PO Take 1 tablet by mouth 2 (two) times a day.     Fluticasone-Umeclidin-Vilant (TRELEGY ELLIPTA) 100-62.5-25 MCG/INH AEPB Inhale 1 puff into the lungs daily. 60 each 11   ibuprofen (ADVIL,MOTRIN) 200 MG tablet Take 400 mg by mouth every 6 (six) hours as needed for headache (pain).      omeprazole (PRILOSEC) 20 MG capsule Take 20 mg by mouth daily.     pravastatin (PRAVACHOL) 40 MG tablet Take 40 mg by mouth daily.     benzonatate (TESSALON) 100 MG capsule Take 2 capsules (200 mg total) by mouth 3 (three) times daily as needed for cough. (Patient not taking: Reported on 01/27/2021) 30 capsule 1   buPROPion (WELLBUTRIN SR) 150 MG 12 hr tablet Take 1 tablet (150 mg total) by mouth 2 (two) times daily. 180 tablet 1   gabapentin (NEURONTIN) 100 MG capsule Take 1 capsule (100 mg total) by mouth 3 (three) times daily. 270 capsule 0   doxycycline (VIBRA-TABS) 100 MG tablet Take 1 tablet (100 mg total) by mouth 2  (two) times daily. (Patient not taking: Reported on 01/27/2021) 14 tablet 0   predniSONE (DELTASONE) 10 MG tablet 4 tabs for 2 days, then 3 tabs for 2 days, 2 tabs for 2 days, then 1 tab for 2 days, then stop (Patient not taking: Reported on 01/27/2021) 20 tablet 0   No facility-administered medications prior to visit.     Review of Systems:   Constitutional:   No  weight loss, night sweats,  Fevers, chills,  +fatigue, or  lassitude.  HEENT:   No headaches,  Difficulty swallowing,  Tooth/dental problems, or  Sore throat,                No sneezing, itching, ear ache,  +nasal congestion, post nasal drip,   CV:  No chest pain,  Orthopnea, PND, swelling in lower extremities, anasarca, dizziness, palpitations, syncope.   GI  No heartburn, indigestion, abdominal pain, nausea, vomiting, diarrhea, change in bowel habits, loss of appetite, bloody stools.   Resp:   No chest wall deformity  Skin: no rash or lesions.  GU: no dysuria, change in color of urine, no urgency or frequency.  No flank pain, no hematuria   MS:  No joint pain or swelling.  No decreased range of motion.  No back pain.    Physical Exam  BP (!) 150/74 (BP Location: Left Arm, Patient Position: Sitting, Cuff Size: Normal)   Pulse 86   Temp 98.4 F (36.9 C) (Oral)   Ht 5\' 6"  (1.676 m)   Wt 120 lb 12.8 oz (54.8 kg)   SpO2 91%   BMI 19.50 kg/m   GEN: A/Ox3; pleasant , NAD, well nourished    HEENT:  [place]/AT,  NOSE-clear, THROAT-clear, no lesions, no postnasal drip or exudate noted. Scattered white patches consistent with oral candidiasis  NECK:  Supple w/ fair ROM; no JVD; normal carotid impulses w/o bruits; no thyromegaly or nodules palpated; no lymphadenopathy.    RESP scattered rhonchi  no accessory muscle use, no dullness to percussion  CARD:  RRR, no m/r/g, no peripheral edema, pulses intact, no cyanosis or clubbing.  GI:   Soft & nt; nml bowel sounds; no organomegaly or masses detected.   Musco: Warm bil,  no deformities or joint swelling noted.   Neuro: alert, no focal deficits noted.    Skin: Warm, no lesions or rashes      BNP   Imaging: DG Chest 2 View  Result Date: 01/19/2021 CLINICAL DATA:  Shortness of breath, COPD exacerbation EXAM: CHEST - 2 VIEW COMPARISON:  02/28/2020 FINDINGS: The heart size and mediastinal contours are within normal limits. Pulmonary hyperinflation and emphysema. Diffuse bilateral interstitial opacity. The visualized skeletal structures are unremarkable. IMPRESSION: Pulmonary hyperinflation and emphysema with diffuse bilateral interstitial opacity, which may reflect superimposed edema or atypical/viral infection. No focal airspace opacity. Electronically Signed   By: Delanna Ahmadi M.D.   On: 01/19/2021 11:11   CT CHEST LUNG CA SCREEN LOW DOSE W/O CM  Result Date: 01/06/2021 CLINICAL DATA:  Seventy pack-year smoking history, quitting 14 years ago EXAM: CT CHEST WITHOUT CONTRAST LOW-DOSE FOR LUNG CANCER SCREENING TECHNIQUE: Multidetector CT imaging of the chest was performed following the standard protocol without IV contrast. COMPARISON:  12/04/2019 FINDINGS: Cardiovascular: Aortic atherosclerosis. Normal heart size, without pericardial effusion. Lad and right coronary artery calcification. Mediastinum/Nodes: No mediastinal or definite hilar adenopathy, given limitations of unenhanced CT. Subtle esophageal fluid level on 30/2. Lungs/Pleura: No pleural fluid. Moderate centrilobular emphysema. Interval left lower lobe patchy opacity and interstitial thickening, likely post infectious or inflammatory. Similar right upper lobe scarring. Biapical pleuroparenchymal scarring. Calcified and noncalcified pulmonary nodules are not significantly changed. Example at maximally volume derived equivalent diameter 7.9 mm in the right upper lobe. Upper Abdomen: Mild hepatic steatosis. Dominant left hepatic lobe 2.5 cm cyst. 6 mm interpolar left renal collecting system calculus. Normal  imaged portions of the spleen, stomach, pancreas, adrenal glands, right kidney. Musculoskeletal: No acute osseous abnormality. IMPRESSION: 1. Lung-RADS 2, benign appearance or behavior. Continue annual screening with low-dose chest CT without contrast in 12 months. 2. Interval mild left lower lobe interstitial thickening and patchy airspace disease, suspicious for infection/inflammation of indeterminate acuity. 3. Esophageal air fluid level suggests dysmotility or gastroesophageal reflux. 4. Left nephrolithiasis. 5. Aortic atherosclerosis (ICD10-I70.0),  coronary artery atherosclerosis and emphysema (ICD10-J43.9). These results will be called to the ordering clinician or representative by the Radiologist Assistant, and communication documented in the PACS or [employer]. Electronically Signed   By: Abigail Miyamoto M.D.   On: 01/06/2021 10:17    methylPREDNISolone acetate (DEPO-MEDROL) injection 80 mg     Date Action Dose Route User   01/19/2021 1155 Given 80 mg Intramuscular (Right Ventrogluteal) Nolon Stalls, Kimber Relic, RN       No flowsheet data found.  No results found for: NITRICOXIDE      Assessment & Plan:   COPD with emphysema Recent exacerbation-has completed 2 courses of antibiotics and steroids.  She is starting to have clinical improvement.  Chest x-ray suspicious for possible early pneumonia.  We will repeat chest x-ray on return visit in 6 to 8 weeks.  Plan  Patient Instructions  Continue on TRELEGY 1 puff daily, brush/rinse and gargle after use.  Mycelex troche five times a day for 1 week.  Mucinex DM Twice daily  As needed  cough/congestion  Albuterol inhaler and neb as needed  Follow up with Dr. Halford Chessman  as planned and As needed   Please contact office for sooner follow up if symptoms do not improve or worsen or seek emergency care         Chronic respiratory failure with hypoxia ([place]) Continue on oxygen 2 L with activity and at bedtime  Oral candidiasis Inhaler  teaching and care given.  We will give Mycelex x1 week.  Plan  Patient Instructions  Continue on TRELEGY 1 puff daily, brush/rinse and gargle after use.  Mycelex troche five times a day for 1 week.  Mucinex DM Twice daily  As needed  cough/congestion  Albuterol inhaler and neb as needed  Follow up with Dr. Halford Chessman  as planned and As needed   Please contact office for sooner follow up if symptoms do not improve or worsen or seek emergency care           [name], NP 01/27/2021

## 2021-01-27 NOTE — Patient Instructions (Addendum)
Continue on TRELEGY 1 puff daily, brush/rinse and gargle after use.  Mycelex troche five times a day for 1 week.  Mucinex DM Twice daily  As needed  cough/congestion  Albuterol inhaler and neb as needed  Follow up with Dr. Halford Chessman  as planned and As needed   Please contact office for sooner follow up if symptoms do not improve or worsen or seek emergency care

## 2021-01-27 NOTE — Progress Notes (Signed)
Reviewed and agree with assessment/plan.   Chesley Mires, MD [facility] Pulmonary/Critical Care 01/27/2021, 12:46 PM Pager:  [phone]

## 2021-01-27 NOTE — Assessment & Plan Note (Signed)
Inhaler teaching and care given.  We will give Mycelex x1 week.  Plan  Patient Instructions  Continue on TRELEGY 1 puff daily, brush/rinse and gargle after use.  Mycelex troche five times a day for 1 week.  Mucinex DM Twice daily  As needed  cough/congestion  Albuterol inhaler and neb as needed  Follow up with Dr. Halford Chessman  as planned and As needed   Please contact office for sooner follow up if symptoms do not improve or worsen or seek emergency care

## 2021-01-27 NOTE — Assessment & Plan Note (Signed)
Continue on oxygen 2 L with activity and at bedtime 

## 2021-01-30 ENCOUNTER — Ambulatory Visit
Admission: RE | Admit: 2021-01-30 | Discharge: 2021-01-30 | Disposition: A | Discharge status: Home / Self Care | Payer: Medicare Other | Source: Ambulatory Visit | Attending: Internal Medicine | Admitting: Internal Medicine

## 2021-01-30 ENCOUNTER — Other Ambulatory Visit: Payer: Self-pay

## 2021-01-30 DIAGNOSIS — Z1231 Encounter for screening mammogram for malignant neoplasm of breast: Secondary | ICD-10-CM

## 2021-02-03 ENCOUNTER — Other Ambulatory Visit: Payer: Self-pay | Admitting: *Deleted

## 2021-02-03 DIAGNOSIS — J449 Chronic obstructive pulmonary disease, unspecified: Secondary | ICD-10-CM

## 2021-02-18 ENCOUNTER — Telehealth: Payer: Self-pay | Admitting: Adult Health

## 2021-02-18 DIAGNOSIS — R059 Cough, unspecified: Secondary | ICD-10-CM

## 2021-02-18 MED ORDER — DOXYCYCLINE HYCLATE 100 MG PO TABS: 100.0000 mg | ORAL_TABLET | Freq: Two times a day (BID) | ORAL | 0 refills | Status: DC

## 2021-02-18 MED ORDER — PREDNISONE 20 MG PO TABS: 20.0000 mg | ORAL_TABLET | Freq: Every day | ORAL | 0 refills | Status: DC

## 2021-02-18 NOTE — Telephone Encounter (Signed)
Cough and fever x 2 days , thicnk mucus  Taking mucinex and tylenol   Doxycycline 100mg  Twice daily  - for 7 days  Prednisone 20mg  daily for 5 days  Covid home test , call back if positive   Needs ov in 2 weeks with chest xray with Dr. Halford Chessman   Please call patient to set up ov   Please contact office for sooner follow up if symptoms do not improve or worsen or seek emergency care

## 2021-02-19 NOTE — Telephone Encounter (Signed)
VS did not have any appts within the next several weeks.  I have scheduled pt to see TP on 12/9 at 930.  Pt is aware to come early and have her CXR done first.

## 2021-02-19 NOTE — Addendum Note (Signed)
Addended by: Elie Confer on: 02/19/2021 08:33 AM   Modules accepted: Orders

## 2021-02-25 ENCOUNTER — Telehealth (HOSPITAL_COMMUNITY): Payer: Self-pay | Admitting: *Deleted

## 2021-02-26 ENCOUNTER — Encounter (HOSPITAL_COMMUNITY): Payer: Self-pay

## 2021-02-26 ENCOUNTER — Encounter (HOSPITAL_COMMUNITY)
Admission: RE | Admit: 2021-02-26 | Discharge: 2021-02-26 | Disposition: A | Discharge status: Home / Self Care | Payer: Medicare Other | Source: Ambulatory Visit | Attending: Pulmonary Disease | Admitting: Pulmonary Disease

## 2021-02-26 ENCOUNTER — Other Ambulatory Visit: Payer: Self-pay

## 2021-02-26 VITALS — BP 130/70 | HR 92 | Ht 66.0 in | Wt 121.7 lb

## 2021-02-26 DIAGNOSIS — J432 Centrilobular emphysema: Secondary | ICD-10-CM | POA: Diagnosis present

## 2021-02-26 NOTE — Progress Notes (Signed)
Brandy Russell 65 y.o. female Pulmonary Rehab Orientation Note This patient who was referred to Pulmonary Rehab by Dr. Halford Chessman with the diagnosis of Centrilobular Emphysema arrived today in Cardiac and Pulmonary Rehab. She  arrived with ambulatory normal gait. She  does carry portable oxygen. [employer] is the provider for their DME. Per pt, Brandy Russell uses oxygen frequently. She will use oxygen when doing activities, but states that she does not use oxygen if she is resting and does not feel short of breath. Color good, skin warm and dry. Patient is oriented to time and place. Patient's medical history, psychosocial health, and medications reviewed. Psychosocial assessment reveals pt lives with alone. 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 Pt hobbies include watching tv and taking care of animals. Pt reports her stress level is moderate. Areas of stress/anxiety include Health.  Pt does not exhibit signs of depression. PHQ2/9 score 0/2. Brandy Russell shows good  coping skills with positive outlook . Offered emotional support and reassurance. Will continue to monitor. Physical assessment reveals heart rate is normal. Lung sounds were assessed by Brandy Dredge RN, patient's lung sounds were clear to auscultation. Brandy Russell reports  she does take medications as prescribed. Patient states she follows a Regular diet. She is trying to gain weight through a healthy diet and exercise. Patient's weight will be monitored closely. Demonstration and practice of PLB using pulse oximeter. Brandy Russell able to return demonstration satisfactorily. Safety and hand hygiene in the exercise area reviewed with patient. Brandy Russell voices understanding of the information reviewed. Department expectations discussed with patient and achievable goals were set. The patient shows enthusiasm about attending the program and we look forward to working with Brandy Russell. Brandy Russell completed a 6 min walk test today and is scheduled to begin exercise on 03/02/21 at 10:15  am.   [id number] Brandy Else, MS, ACSM-CEP

## 2021-02-26 NOTE — Progress Notes (Signed)
Pulmonary Individual Treatment Plan  Patient Details  Name: Brandy Russell MRN: [id number] Date of Birth: October 01, 1955 Referring Provider:   April Manson Pulmonary Rehab Walk Test from 02/26/2021 in [place]  Referring Provider [place]       Initial Encounter Date:  Flowsheet Row Pulmonary Rehab Walk Test from 02/26/2021 in [place]  Date 02/26/21       Visit Diagnosis: Centrilobular emphysema ([place])  Patient's Home Medications on Admission:   Current Outpatient Medications:    acetaminophen (TYLENOL) 325 MG tablet, Take 325 mg by mouth every 6 (six) hours as needed., Disp: , Rfl:    albuterol (PROVENTIL) (2.5 MG/3ML) 0.083% nebulizer solution, Take 3 mLs (2.5 mg total) by nebulization every 6 (six) hours as needed for wheezing or shortness of breath., Disp: 75 mL, Rfl: 3   Albuterol Sulfate (PROAIR RESPICLICK) 620 (90 Base) MCG/ACT AEPB, Inhale 2 puffs into the lungs every 6 (six) hours as needed., Disp: 3 each, Rfl: 1   benzonatate (TESSALON) 100 MG capsule, Take 2 capsules (200 mg total) by mouth 3 (three) times daily as needed for cough., Disp: 30 capsule, Rfl: 1   clotrimazole (MYCELEX) 10 MG troche, Take 1 tablet (10 mg total) by mouth 5 (five) times daily., Disp: 35 Troche, Rfl: 0   diclofenac sodium (VOLTAREN) 1 % GEL, Apply 1 application topically daily as needed (pain). , Disp: , Rfl: 2   FIBER PO, Take 1 tablet by mouth 2 (two) times a day., Disp: , Rfl:    Fluticasone-Umeclidin-Vilant (TRELEGY ELLIPTA) 100-62.5-25 MCG/INH AEPB, Inhale 1 puff into the lungs daily., Disp: 60 each, Rfl: 11   ibuprofen (ADVIL,MOTRIN) 200 MG tablet, Take 400 mg by mouth every 6 (six) hours as needed for headache (pain). , Disp: , Rfl:    omeprazole (PRILOSEC) 20 MG capsule, Take 20 mg by mouth daily., Disp: , Rfl:    pravastatin (PRAVACHOL) 40 MG tablet, Take 40 mg by mouth daily., Disp: , Rfl:    buPROPion (WELLBUTRIN SR) 150  MG 12 hr tablet, Take 1 tablet (150 mg total) by mouth 2 (two) times daily., Disp: 180 tablet, Rfl: 1   doxycycline (VIBRA-TABS) 100 MG tablet, Take 1 tablet (100 mg total) by mouth 2 (two) times daily. (Patient not taking: Reported on 02/26/2021), Disp: 14 tablet, Rfl: 0   gabapentin (NEURONTIN) 100 MG capsule, Take 1 capsule (100 mg total) by mouth 3 (three) times daily., Disp: 270 capsule, Rfl: 0   predniSONE (DELTASONE) 20 MG tablet, Take 1 tablet (20 mg total) by mouth daily with breakfast. (Patient not taking: Reported on 02/26/2021), Disp: 5 tablet, Rfl: 0  Past Medical History: Past Medical History:  Diagnosis Date   Allergy    Anxiety and depression    CAD (coronary artery disease)    incidental finding on CT scan for lung ca screening   Constipation    Depression    Emphysema    Emphysema of lung (HCC)    GERD (gastroesophageal reflux disease)    Herpes    face, uses suppresive therapy intermittently   Seasonal allergies     Tobacco Use: Social History   Tobacco Use  Smoking Status Former   Packs/day: 2.00   Years: 35.00   Pack years: 70.00   Types: Cigarettes   Quit date: 03/28/2006   Years since quitting: 14.9  Smokeless Tobacco Never    Labs: Recent Review Management consultant for ITP  Cardiac and Pulmonary Rehab Latest Ref Rng & Units 04/16/2012 09/19/2013 11/11/2014   Cholestrol 0 - 200 mg/dL 278(H) 280(H) 250(H)   LDLCALC 0 - 99 mg/dL - 127(H) 106(H)   LDLDIRECT mg/dL 102.8 - -   HDL >39.00 mg/dL 142.90 141.00 133.20   Trlycerides 0.0 - 149.0 mg/dL 62.0 62.0 55.0   Hemoglobin A1c 4.6 - 6.5 % 5.4 5.5 -       Capillary Blood Glucose: Lab Results  Component Value Date   GLUCAP 93 06/21/2012     Pulmonary Assessment Scores:  Pulmonary Assessment Scores     Row Name 02/26/21 1112         ADL UCSD   ADL Phase Entry     SOB Score total 61       CAT Score   CAT Score 26       mMRC Score   mMRC Score 61              UCSD: Self-administered rating of dyspnea associated with activities of daily living (ADLs) 6-point scale (0 = "not at all" to 5 = "maximal or unable to do because of breathlessness")  Scoring Scores range from 0 to 120.  Minimally important difference is 5 units  CAT: CAT can identify the health impairment of COPD patients and is better correlated with disease progression.  CAT has a scoring range of zero to 40. The CAT score is classified into four groups of low (less than 10), medium (10 - 20), high (21-30) and very high (31-40) based on the impact level of disease on health status. A CAT score over 10 suggests significant symptoms.  A worsening CAT score could be explained by an exacerbation, poor medication adherence, poor inhaler technique, or progression of COPD or comorbid conditions.  CAT MCID is 2 points  mMRC: mMRC (Modified Medical Research Council) Dyspnea Scale is used to assess the degree of baseline functional disability in patients of respiratory disease due to dyspnea. No minimal important difference is established. A decrease in score of 1 point or greater is considered a positive change.   Pulmonary Function Assessment:  Pulmonary Function Assessment - 02/26/21 1033       Breath   Bilateral Breath Sounds Clear    Shortness of Breath Yes;Fear of Shortness of Breath;Limiting activity             Exercise Target Goals: Exercise Program Goal: Individual exercise prescription set using results from initial 6 min walk test and THRR while considering  patient's activity barriers and safety.   Exercise Prescription Goal: Initial exercise prescription builds to 30-45 minutes a day of aerobic activity, 2-3 days per week.  Home exercise guidelines will be given to patient during program as part of exercise prescription that the participant will acknowledge.  Activity Barriers & Risk Stratification:  Activity Barriers & Cardiac Risk Stratification - 02/26/21 1046        Activity Barriers & Cardiac Risk Stratification   Activity Barriers Muscular Weakness;Shortness of Breath;Deconditioning    Cardiac Risk Stratification Low             6 Minute Walk:  6 Minute Walk     Row Name 02/26/21 1306         6 Minute Walk   Phase Initial     Distance 852 feet     Walk Time 6 minutes     # of Rest Breaks 1  4:41-5:40     MPH 1.61  METS 2.94     RPE 13     Perceived Dyspnea  2     VO2 Peak 10.28     Symptoms No     Resting HR 92 bpm     Resting BP 130/68     Resting Oxygen Saturation  100 %     Exercise Oxygen Saturation  during 6 min walk 99 %     Max Ex. HR 117 bpm     Max Ex. BP 130/74     2 Minute Post BP 128/72       Interval HR   1 Minute HR 103     2 Minute HR 108     3 Minute HR 114     4 Minute HR 117     5 Minute HR 113     6 Minute HR 108     2 Minute Post HR 102     Interval Heart Rate? Yes       Interval Oxygen   Interval Oxygen? Yes     Baseline Oxygen Saturation % 100 %  2L, exercises on 3L     1 Minute Oxygen Saturation % 100 %     1 Minute Liters of Oxygen 3 L     2 Minute Oxygen Saturation % 100 %     2 Minute Liters of Oxygen 3 L     3 Minute Oxygen Saturation % 100 %     3 Minute Liters of Oxygen 3 L     4 Minute Oxygen Saturation % 99 %     4 Minute Liters of Oxygen 3 L     5 Minute Oxygen Saturation % 99 %     5 Minute Liters of Oxygen 3 L     6 Minute Oxygen Saturation % 99 %     6 Minute Liters of Oxygen 3 L     2 Minute Post Oxygen Saturation % 100 %     2 Minute Post Liters of Oxygen 2 L              Oxygen Initial Assessment:  Oxygen Initial Assessment - 02/26/21 1030       Home Oxygen   Home Oxygen Device E-Tanks;Home Concentrator    Sleep Oxygen Prescription Continuous    Liters per minute 2    Home Exercise Oxygen Prescription Pulsed    Liters per minute 3    Home Resting Oxygen Prescription Continuous    Liters per minute 2    Compliance with Home Oxygen Use Yes      Initial  6 min Walk   Oxygen Used Continuous    Liters per minute 3      Program Oxygen Prescription   Program Oxygen Prescription Continuous    Liters per minute 3      Intervention   Short Term Goals To learn and exhibit compliance with exercise, home and travel O2 prescription;To learn and understand importance of monitoring SPO2 with pulse oximeter and demonstrate accurate use of the pulse oximeter.;To learn and understand importance of maintaining oxygen saturations>88%;To learn and demonstrate proper pursed lip breathing techniques or other breathing techniques. ;To learn and demonstrate proper use of respiratory medications    Long  Term Goals Exhibits compliance with exercise, home  and travel O2 prescription;Verbalizes importance of monitoring SPO2 with pulse oximeter and return demonstration;Maintenance of O2 saturations>88%;Exhibits proper breathing techniques, such as pursed lip breathing or other method taught during program session;Compliance with respiratory  medication;Demonstrates proper use of MDI's             Oxygen Re-Evaluation:   Oxygen Discharge (Final Oxygen Re-Evaluation):   Initial Exercise Prescription:  Initial Exercise Prescription - 02/26/21 1300       Date of Initial Exercise RX and Referring Provider   Date 02/26/21    Referring Provider Sood    Expected Discharge Date 04/29/21      Oxygen   Oxygen Continuous    Liters 3    Maintain Oxygen Saturation 88% or higher      NuStep   Level 2    SPM 80    Minutes 15      Track   Minutes 15    METs 3.28      Prescription Details   Frequency (times per week) 2    Duration Progress to 30 minutes of continuous aerobic without signs/symptoms of physical distress      Intensity   THRR 40-80% of Max Heartrate 62-123    Ratings of Perceived Exertion 11-13    Perceived Dyspnea 0-4      Progression   Progression Continue to progress workloads to maintain intensity without signs/symptoms of physical  distress.      Resistance Training   Training Prescription Yes    Weight red bands    Reps 10-15             Perform Capillary Blood Glucose checks as needed.  Exercise Prescription Changes:   Exercise Comments:   Exercise Goals and Review:   Exercise Goals     Row Name 02/26/21 1336             Exercise Goals   Increase Physical Activity Yes       Intervention Provide advice, education, support and counseling about physical activity/exercise needs.;Develop an individualized exercise prescription for aerobic and resistive training based on initial evaluation findings, risk stratification, comorbidities and participant's personal goals.       Expected Outcomes Short Term: Attend rehab on a regular basis to increase amount of physical activity.;Long Term: Add in home exercise to make exercise part of routine and to increase amount of physical activity.;Long Term: Exercising regularly at least 3-5 days a week.       Increase Strength and Stamina Yes       Intervention Provide advice, education, support and counseling about physical activity/exercise needs.;Develop an individualized exercise prescription for aerobic and resistive training based on initial evaluation findings, risk stratification, comorbidities and participant's personal goals.       Expected Outcomes Short Term: Increase workloads from initial exercise prescription for resistance, speed, and METs.;Short Term: Perform resistance training exercises routinely during rehab and add in resistance training at home;Long Term: Improve cardiorespiratory fitness, muscular endurance and strength as measured by increased METs and functional capacity (6MWT)       Able to understand and use rate of perceived exertion (RPE) scale Yes       Intervention Provide education and explanation on how to use RPE scale       Expected Outcomes Short Term: Able to use RPE daily in rehab to express subjective intensity level;Long Term:  Able  to use RPE to guide intensity level when exercising independently       Able to understand and use Dyspnea scale Yes       Intervention Provide education and explanation on how to use Dyspnea scale       Expected Outcomes Short Term: Able to use  Dyspnea scale daily in rehab to express subjective sense of shortness of breath during exertion;Long Term: Able to use Dyspnea scale to guide intensity level when exercising independently       Knowledge and understanding of Target Heart Rate Range (THRR) Yes       Intervention Provide education and explanation of THRR including how the numbers were predicted and where they are located for reference       Expected Outcomes Short Term: Able to state/look up THRR;Short Term: Able to use daily as guideline for intensity in rehab;Long Term: Able to use THRR to govern intensity when exercising independently       Understanding of Exercise Prescription Yes       Intervention Provide education, explanation, and written materials on patient's individual exercise prescription       Expected Outcomes Short Term: Able to explain program exercise prescription;Long Term: Able to explain home exercise prescription to exercise independently                Exercise Goals Re-Evaluation :   Discharge Exercise Prescription (Final Exercise Prescription Changes):   Nutrition:  Target Goals: Understanding of nutrition guidelines, daily intake of sodium 1500mg , cholesterol 200mg , calories 30% from fat and 7% or less from saturated fats, daily to have 5 or more servings of fruits and vegetables.  Biometrics:  Pre Biometrics - 02/26/21 1019       Pre Biometrics   Grip Strength 20 kg              Nutrition Therapy Plan and Nutrition Goals:   Nutrition Assessments:  MEDIFICTS Score Key: ?70 Need to make dietary changes  40-70 Heart Healthy Diet ? 40 Therapeutic Level Cholesterol Diet   Picture Your Plate Scores: <01 Unhealthy dietary pattern with  much room for improvement. 41-50 Dietary pattern unlikely to meet recommendations for good health and room for improvement. 51-60 More healthful dietary pattern, with some room for improvement.  >60 Healthy dietary pattern, although there may be some specific behaviors that could be improved.    Nutrition Goals Re-Evaluation:   Nutrition Goals Discharge (Final Nutrition Goals Re-Evaluation):   Psychosocial: Target Goals: Acknowledge presence or absence of significant depression and/or stress, maximize coping skills, provide positive support system. Participant is able to verbalize types and ability to use techniques and skills needed for reducing stress and depression.  Initial Review & Psychosocial Screening:  Initial Psych Review & Screening - 02/26/21 1036       Initial Review   Current issues with History of Depression;Current Anxiety/Panic   is on medication for anxiety and depression     Family Dynamics   Good Support System? Yes    Comments sister and friends      Barriers   Psychosocial barriers to participate in program There are no identifiable barriers or psychosocial needs.      Screening Interventions   Interventions Encouraged to exercise             Quality of Life Scores:  Scores of 19 and below usually indicate a poorer quality of life in these areas.  A difference of  2-3 points is a clinically meaningful difference.  A difference of 2-3 points in the total score of the Quality of Life Index has been associated with significant improvement in overall quality of life, self-image, physical symptoms, and general health in studies assessing change in quality of life.  PHQ-9: Recent Review Flowsheet Data     Depression screen [facility] 2/9  02/26/2021   Decreased Interest 0   Down, Depressed, Hopeless 0   PHQ - 2 Score 0   Altered sleeping 0   Tired, decreased energy 1   Change in appetite 0   Feeling bad or failure about yourself  1   Trouble concentrating 0    Moving slowly or fidgety/restless 0   Suicidal thoughts 0   PHQ-9 Score 2   Difficult doing work/chores Very difficult      Interpretation of Total Score  Total Score Depression Severity:  1-4 = Minimal depression, 5-9 = Mild depression, 10-14 = Moderate depression, 15-19 = Moderately severe depression, 20-27 = Severe depression   Psychosocial Evaluation and Intervention:  Psychosocial Evaluation - 02/26/21 1323       Psychosocial Evaluation & Interventions   Interventions Encouraged to exercise with the program and follow exercise prescription    Comments History of anxiety and depression on medications             Psychosocial Re-Evaluation:   Psychosocial Discharge (Final Psychosocial Re-Evaluation):   Education: Education Goals: Education classes will be provided on a weekly basis, covering required topics. Participant will state understanding/return demonstration of topics presented.  Learning Barriers/Preferences:  Learning Barriers/Preferences - 02/26/21 1038       Learning Barriers/Preferences   Learning Barriers Sight   wears glasses   Learning Preferences Audio;Computer/Internet;Group Instruction;Individual Instruction;Pictoral;Skilled Demonstration;Verbal Instruction;Video;Written Material             Education Topics: Risk Factor Reduction:  -Group instruction that is supported by a PowerPoint presentation. Instructor discusses the definition of a risk factor, different risk factors for pulmonary disease, and how the heart and lungs work together.     Nutrition for Pulmonary Patient:  -Group instruction provided by PowerPoint slides, verbal discussion, and written materials to support subject matter. The instructor gives an explanation and review of healthy diet recommendations, which includes a discussion on weight management, recommendations for fruit and vegetable consumption, as well as protein, fluid, caffeine, fiber, sodium, sugar, and  alcohol. Tips for eating when patients are short of breath are discussed.   Pursed Lip Breathing:  -Group instruction that is supported by demonstration and informational handouts. Instructor discusses the benefits of pursed lip and diaphragmatic breathing and detailed demonstration on how to preform both.     Oxygen Safety:  -Group instruction provided by PowerPoint, verbal discussion, and written material to support subject matter. There is an overview of "What is Oxygen" and "Why do we need it".  Instructor also reviews how to create a safe environment for oxygen use, the importance of using oxygen as prescribed, and the risks of noncompliance. There is a brief discussion on traveling with oxygen and resources the patient may utilize.   Oxygen Equipment:  -Group instruction provided by [facility] Staff utilizing handouts, written materials, and equipment demonstrations.   Signs and Symptoms:  -Group instruction provided by written material and verbal discussion to support subject matter. Warning signs and symptoms of infection, stroke, and heart attack are reviewed and when to call the physician/911 reinforced. Tips for preventing the spread of infection discussed.   Advanced Directives:  -Group instruction provided by verbal instruction and written material to support subject matter. Instructor reviews Advanced Directive laws and proper instruction for filling out document.   Pulmonary Video:  -Group video education that reviews the importance of medication and oxygen compliance, exercise, good nutrition, pulmonary hygiene, and pursed lip and diaphragmatic breathing for the pulmonary patient.   Exercise for  the Pulmonary Patient:  -Group instruction that is supported by a PowerPoint presentation. Instructor discusses benefits of exercise, core components of exercise, frequency, duration, and intensity of an exercise routine, importance of utilizing pulse oximetry during exercise,  safety while exercising, and options of places to exercise outside of rehab.     Pulmonary Medications:  -Verbally interactive group education provided by instructor with focus on inhaled medications and proper administration.   Anatomy and Physiology of the Respiratory System and Intimacy:  -Group instruction provided by PowerPoint, verbal discussion, and written material to support subject matter. Instructor reviews respiratory cycle and anatomical components of the respiratory system and their functions. Instructor also reviews differences in obstructive and restrictive respiratory diseases with examples of each. Intimacy, Sex, and Sexuality differences are reviewed with a discussion on how relationships can change when diagnosed with pulmonary disease. Common sexual concerns are reviewed.   MD DAY -A group question and answer session with a medical doctor that allows participants to ask questions that relate to their pulmonary disease state.   OTHER EDUCATION -Group or individual verbal, written, or video instructions that support the educational goals of the pulmonary rehab program.   Holiday Eating Survival Tips:  -Group instruction provided by PowerPoint slides, verbal discussion, and written materials to support subject matter. The instructor gives patients tips, tricks, and techniques to help them not only survive but enjoy the holidays despite the onslaught of food that accompanies the holidays.   Knowledge Questionnaire Score:  Knowledge Questionnaire Score - 02/26/21 1040       Knowledge Questionnaire Score   Pre Score 18/18             Core Components/Risk Factors/Patient Goals at Admission:  Personal Goals and Risk Factors at Admission - 02/26/21 1044       Core Components/Risk Factors/Patient Goals on Admission    Weight Management Weight Gain;Yes   wants to gain weight   Improve shortness of breath with ADL's Yes    Intervention Provide education,  individualized exercise plan and daily activity instruction to help decrease symptoms of SOB with activities of daily living.    Expected Outcomes Short Term: Improve cardiorespiratory fitness to achieve a reduction of symptoms when performing ADLs;Long Term: Be able to perform more ADLs without symptoms or delay the onset of symptoms             Core Components/Risk Factors/Patient Goals Review:    Core Components/Risk Factors/Patient Goals at Discharge (Final Review):    ITP Comments:  Dr. Rodman Pickle is Medical Director for Pulmonary Rehab at [facility].

## 2021-03-02 ENCOUNTER — Encounter (HOSPITAL_COMMUNITY)
Admission: RE | Admit: 2021-03-02 | Discharge: 2021-03-02 | Disposition: A | Discharge status: Home / Self Care | Payer: Medicare Other | Source: Ambulatory Visit | Attending: Pulmonary Disease | Admitting: Pulmonary Disease

## 2021-03-02 ENCOUNTER — Other Ambulatory Visit: Payer: Self-pay

## 2021-03-02 VITALS — Wt 119.5 lb

## 2021-03-02 DIAGNOSIS — J432 Centrilobular emphysema: Secondary | ICD-10-CM

## 2021-03-02 NOTE — Progress Notes (Signed)
Daily Session Note  Patient Details  Name: Brandy Russell MRN: [id number] Date of Birth: July 08, 1955 Referring Provider:   April Manson Pulmonary Rehab Walk Test from 02/26/2021 in [place]  Referring Provider Sood       Encounter Date: 03/02/2021  Check In:  Session Check In - 03/02/21 1131       Check-In   Supervising physician immediately available to respond to emergencies [facility] immediately available    Physician(s) Dr. Cathlean Sauer    Location [facility]    Staff Present Rosebud Poles, RN, BSN;Lisa Ysidro Evert, Cathleen Fears, MS, ACSM-CEP, Exercise Physiologist;Portia Rollene Rotunda, RN, BSN    Virtual Visit No    Medication changes reported     No    Fall or balance concerns reported    No    Tobacco Cessation No Change    Warm-up and Cool-down Performed as group-led instruction    Resistance Training Performed Yes    VAD Patient? No    PAD/SET Patient? No      Pain Assessment   Currently in Pain? No/denies    Multiple Pain Sites No             Capillary Blood Glucose: No results found for this or any previous visit (from the past 24 hour(s)).    Social History   Tobacco Use  Smoking Status Former   Packs/day: 2.00   Years: 35.00   Pack years: 70.00   Types: Cigarettes   Quit date: 03/28/2006   Years since quitting: 14.9  Smokeless Tobacco Never    Goals Met:  Exercise tolerated well No report of concerns or symptoms today Strength training completed today  Goals Unmet:  Not Applicable  Comments: Service time is from 1024 to 1140. Completed 1st day of exercise.    Dr. Rodman Pickle is Medical Director for Pulmonary Rehab at [facility].

## 2021-03-02 NOTE — Progress Notes (Signed)
Pulmonary Individual Treatment Plan  Patient Details  Name: Brandy Russell MRN: [id number] Date of Birth: April 25, 1955 Referring Provider:   April Manson Pulmonary Rehab Walk Test from 02/26/2021 in [facility] [place]  Referring Provider [place]       Initial Encounter Date:  Flowsheet Row Pulmonary Rehab Walk Test from 02/26/2021 in [facility]  Date 02/26/21       Visit Diagnosis: Centrilobular emphysema ([place])  Patient's Home Medications on Admission:   Current Outpatient Medications:    acetaminophen (TYLENOL) 325 MG tablet, Take 325 mg by mouth every 6 (six) hours as needed., Disp: , Rfl:    albuterol (PROVENTIL) (2.5 MG/3ML) 0.083% nebulizer solution, Take 3 mLs (2.5 mg total) by nebulization every 6 (six) hours as needed for wheezing or shortness of breath., Disp: 75 mL, Rfl: 3   Albuterol Sulfate (PROAIR RESPICLICK) 284 (90 Base) MCG/ACT AEPB, Inhale 2 puffs into the lungs every 6 (six) hours as needed., Disp: 3 each, Rfl: 1   benzonatate (TESSALON) 100 MG capsule, Take 2 capsules (200 mg total) by mouth 3 (three) times daily as needed for cough., Disp: 30 capsule, Rfl: 1   buPROPion (WELLBUTRIN SR) 150 MG 12 hr tablet, Take 1 tablet (150 mg total) by mouth 2 (two) times daily., Disp: 180 tablet, Rfl: 1   clotrimazole (MYCELEX) 10 MG troche, Take 1 tablet (10 mg total) by mouth 5 (five) times daily., Disp: 35 Troche, Rfl: 0   diclofenac sodium (VOLTAREN) 1 % GEL, Apply 1 application topically daily as needed (pain). , Disp: , Rfl: 2   doxycycline (VIBRA-TABS) 100 MG tablet, Take 1 tablet (100 mg total) by mouth 2 (two) times daily. (Patient not taking: Reported on 02/26/2021), Disp: 14 tablet, Rfl: 0   FIBER PO, Take 1 tablet by mouth 2 (two) times a day., Disp: , Rfl:    Fluticasone-Umeclidin-Vilant (TRELEGY ELLIPTA) 100-62.5-25 MCG/INH AEPB, Inhale 1 puff into the lungs daily., Disp: 60 each, Rfl: 11   gabapentin  (NEURONTIN) 100 MG capsule, Take 1 capsule (100 mg total) by mouth 3 (three) times daily., Disp: 270 capsule, Rfl: 0   ibuprofen (ADVIL,MOTRIN) 200 MG tablet, Take 400 mg by mouth every 6 (six) hours as needed for headache (pain). , Disp: , Rfl:    omeprazole (PRILOSEC) 20 MG capsule, Take 20 mg by mouth daily., Disp: , Rfl:    pravastatin (PRAVACHOL) 40 MG tablet, Take 40 mg by mouth daily., Disp: , Rfl:    predniSONE (DELTASONE) 20 MG tablet, Take 1 tablet (20 mg total) by mouth daily with breakfast. (Patient not taking: Reported on 02/26/2021), Disp: 5 tablet, Rfl: 0  Past Medical History: Past Medical History:  Diagnosis Date   Allergy    Anxiety and depression    CAD (coronary artery disease)    incidental finding on CT scan for lung ca screening   Constipation    Depression    Emphysema    Emphysema of lung (HCC)    GERD (gastroesophageal reflux disease)    Herpes    face, uses suppresive therapy intermittently   Seasonal allergies     Tobacco Use: Social History   Tobacco Use  Smoking Status Former   Packs/day: 2.00   Years: 35.00   Pack years: 70.00   Types: Cigarettes   Quit date: 03/28/2006   Years since quitting: 14.9  Smokeless Tobacco Never    Labs: Recent Review Management consultant for ITP  Cardiac and Pulmonary Rehab Latest Ref Rng & Units 04/16/2012 09/19/2013 11/11/2014   Cholestrol 0 - 200 mg/dL 278(H) 280(H) 250(H)   LDLCALC 0 - 99 mg/dL - 127(H) 106(H)   LDLDIRECT mg/dL 102.8 - -   HDL >39.00 mg/dL 142.90 141.00 133.20   Trlycerides 0.0 - 149.0 mg/dL 62.0 62.0 55.0   Hemoglobin A1c 4.6 - 6.5 % 5.4 5.5 -       Capillary Blood Glucose: Lab Results  Component Value Date   GLUCAP 93 06/21/2012     Pulmonary Assessment Scores:  Pulmonary Assessment Scores     Row Name 02/26/21 1112         ADL UCSD   ADL Phase Entry     SOB Score total 61       CAT Score   CAT Score 26       mMRC Score   mMRC Score 61              UCSD: Self-administered rating of dyspnea associated with activities of daily living (ADLs) 6-point scale (0 = "not at all" to 5 = "maximal or unable to do because of breathlessness")  Scoring Scores range from 0 to 120.  Minimally important difference is 5 units  CAT: CAT can identify the health impairment of COPD patients and is better correlated with disease progression.  CAT has a scoring range of zero to 40. The CAT score is classified into four groups of low (less than 10), medium (10 - 20), high (21-30) and very high (31-40) based on the impact level of disease on health status. A CAT score over 10 suggests significant symptoms.  A worsening CAT score could be explained by an exacerbation, poor medication adherence, poor inhaler technique, or progression of COPD or comorbid conditions.  CAT MCID is 2 points  mMRC: mMRC (Modified Medical Research Council) Dyspnea Scale is used to assess the degree of baseline functional disability in patients of respiratory disease due to dyspnea. No minimal important difference is established. A decrease in score of 1 point or greater is considered a positive change.   Pulmonary Function Assessment:  Pulmonary Function Assessment - 02/26/21 1033       Breath   Bilateral Breath Sounds Clear    Shortness of Breath Yes;Fear of Shortness of Breath;Limiting activity             Exercise Target Goals: Exercise Program Goal: Individual exercise prescription set using results from initial 6 min walk test and THRR while considering  patient's activity barriers and safety.   Exercise Prescription Goal: Initial exercise prescription builds to 30-45 minutes a day of aerobic activity, 2-3 days per week.  Home exercise guidelines will be given to patient during program as part of exercise prescription that the participant will acknowledge.  Activity Barriers & Risk Stratification:  Activity Barriers & Cardiac Risk Stratification - 02/26/21 1046        Activity Barriers & Cardiac Risk Stratification   Activity Barriers Muscular Weakness;Shortness of Breath;Deconditioning    Cardiac Risk Stratification Low             6 Minute Walk:  6 Minute Walk     Row Name 02/26/21 1306         6 Minute Walk   Phase Initial     Distance 852 feet     Walk Time 6 minutes     # of Rest Breaks 1  4:41-5:40     MPH 1.61  METS 2.94     RPE 13     Perceived Dyspnea  2     VO2 Peak 10.28     Symptoms No     Resting HR 92 bpm     Resting BP 130/68     Resting Oxygen Saturation  100 %     Exercise Oxygen Saturation  during 6 min walk 99 %     Max Ex. HR 117 bpm     Max Ex. BP 130/74     2 Minute Post BP 128/72       Interval HR   1 Minute HR 103     2 Minute HR 108     3 Minute HR 114     4 Minute HR 117     5 Minute HR 113     6 Minute HR 108     2 Minute Post HR 102     Interval Heart Rate? Yes       Interval Oxygen   Interval Oxygen? Yes     Baseline Oxygen Saturation % 100 %  2L, exercises on 3L     1 Minute Oxygen Saturation % 100 %     1 Minute Liters of Oxygen 3 L     2 Minute Oxygen Saturation % 100 %     2 Minute Liters of Oxygen 3 L     3 Minute Oxygen Saturation % 100 %     3 Minute Liters of Oxygen 3 L     4 Minute Oxygen Saturation % 99 %     4 Minute Liters of Oxygen 3 L     5 Minute Oxygen Saturation % 99 %     5 Minute Liters of Oxygen 3 L     6 Minute Oxygen Saturation % 99 %     6 Minute Liters of Oxygen 3 L     2 Minute Post Oxygen Saturation % 100 %     2 Minute Post Liters of Oxygen 2 L              Oxygen Initial Assessment:  Oxygen Initial Assessment - 02/26/21 1030       Home Oxygen   Home Oxygen Device E-Tanks;Home Concentrator    Sleep Oxygen Prescription Continuous    Liters per minute 2    Home Exercise Oxygen Prescription Pulsed    Liters per minute 3    Home Resting Oxygen Prescription Continuous    Liters per minute 2    Compliance with Home Oxygen Use Yes      Initial  6 min Walk   Oxygen Used Continuous    Liters per minute 3      Program Oxygen Prescription   Program Oxygen Prescription Continuous    Liters per minute 3      Intervention   Short Term Goals To learn and exhibit compliance with exercise, home and travel O2 prescription;To learn and understand importance of monitoring SPO2 with pulse oximeter and demonstrate accurate use of the pulse oximeter.;To learn and understand importance of maintaining oxygen saturations>88%;To learn and demonstrate proper pursed lip breathing techniques or other breathing techniques. ;To learn and demonstrate proper use of respiratory medications    Long  Term Goals Exhibits compliance with exercise, home  and travel O2 prescription;Verbalizes importance of monitoring SPO2 with pulse oximeter and return demonstration;Maintenance of O2 saturations>88%;Exhibits proper breathing techniques, such as pursed lip breathing or other method taught during program session;Compliance with respiratory  medication;Demonstrates proper use of MDI's             Oxygen Re-Evaluation:   Oxygen Discharge (Final Oxygen Re-Evaluation):   Initial Exercise Prescription:  Initial Exercise Prescription - 02/26/21 1300       Date of Initial Exercise RX and Referring Provider   Date 02/26/21    Referring Provider Sood    Expected Discharge Date 04/29/21      Oxygen   Oxygen Continuous    Liters 3    Maintain Oxygen Saturation 88% or higher      NuStep   Level 2    SPM 80    Minutes 15      Track   Minutes 15    METs 3.28      Prescription Details   Frequency (times per week) 2    Duration Progress to 30 minutes of continuous aerobic without signs/symptoms of physical distress      Intensity   THRR 40-80% of Max Heartrate 62-123    Ratings of Perceived Exertion 11-13    Perceived Dyspnea 0-4      Progression   Progression Continue to progress workloads to maintain intensity without signs/symptoms of physical  distress.      Resistance Training   Training Prescription Yes    Weight red bands    Reps 10-15             Perform Capillary Blood Glucose checks as needed.  Exercise Prescription Changes:   Exercise Comments:   Exercise Goals and Review:   Exercise Goals     Row Name 02/26/21 1336             Exercise Goals   Increase Physical Activity Yes       Intervention Provide advice, education, support and counseling about physical activity/exercise needs.;Develop an individualized exercise prescription for aerobic and resistive training based on initial evaluation findings, risk stratification, comorbidities and participant's personal goals.       Expected Outcomes Short Term: Attend rehab on a regular basis to increase amount of physical activity.;Long Term: Add in home exercise to make exercise part of routine and to increase amount of physical activity.;Long Term: Exercising regularly at least 3-5 days a week.       Increase Strength and Stamina Yes       Intervention Provide advice, education, support and counseling about physical activity/exercise needs.;Develop an individualized exercise prescription for aerobic and resistive training based on initial evaluation findings, risk stratification, comorbidities and participant's personal goals.       Expected Outcomes Short Term: Increase workloads from initial exercise prescription for resistance, speed, and METs.;Short Term: Perform resistance training exercises routinely during rehab and add in resistance training at home;Long Term: Improve cardiorespiratory fitness, muscular endurance and strength as measured by increased METs and functional capacity (6MWT)       Able to understand and use rate of perceived exertion (RPE) scale Yes       Intervention Provide education and explanation on how to use RPE scale       Expected Outcomes Short Term: Able to use RPE daily in rehab to express subjective intensity level;Long Term:  Able  to use RPE to guide intensity level when exercising independently       Able to understand and use Dyspnea scale Yes       Intervention Provide education and explanation on how to use Dyspnea scale       Expected Outcomes Short Term: Able to use  Dyspnea scale daily in rehab to express subjective sense of shortness of breath during exertion;Long Term: Able to use Dyspnea scale to guide intensity level when exercising independently       Knowledge and understanding of Target Heart Rate Range (THRR) Yes       Intervention Provide education and explanation of THRR including how the numbers were predicted and where they are located for reference       Expected Outcomes Short Term: Able to state/look up THRR;Short Term: Able to use daily as guideline for intensity in rehab;Long Term: Able to use THRR to govern intensity when exercising independently       Understanding of Exercise Prescription Yes       Intervention Provide education, explanation, and written materials on patient's individual exercise prescription       Expected Outcomes Short Term: Able to explain program exercise prescription;Long Term: Able to explain home exercise prescription to exercise independently                Exercise Goals Re-Evaluation :   Discharge Exercise Prescription (Final Exercise Prescription Changes):   Nutrition:  Target Goals: Understanding of nutrition guidelines, daily intake of sodium 1500mg , cholesterol 200mg , calories 30% from fat and 7% or less from saturated fats, daily to have 5 or more servings of fruits and vegetables.  Biometrics:  Pre Biometrics - 02/26/21 1019       Pre Biometrics   Grip Strength 20 kg              Nutrition Therapy Plan and Nutrition Goals:   Nutrition Assessments:  MEDIFICTS Score Key: ?70 Need to make dietary changes  40-70 Heart Healthy Diet ? 40 Therapeutic Level Cholesterol Diet   Picture Your Plate Scores: <50 Unhealthy dietary pattern with  much room for improvement. 41-50 Dietary pattern unlikely to meet recommendations for good health and room for improvement. 51-60 More healthful dietary pattern, with some room for improvement.  >60 Healthy dietary pattern, although there may be some specific behaviors that could be improved.    Nutrition Goals Re-Evaluation:   Nutrition Goals Discharge (Final Nutrition Goals Re-Evaluation):   Psychosocial: Target Goals: Acknowledge presence or absence of significant depression and/or stress, maximize coping skills, provide positive support system. Participant is able to verbalize types and ability to use techniques and skills needed for reducing stress and depression.  Initial Review & Psychosocial Screening:  Initial Psych Review & Screening - 02/26/21 1036       Initial Review   Current issues with History of Depression;Current Anxiety/Panic   is on medication for anxiety and depression     Family Dynamics   Good Support System? Yes    Comments sister and friends      Barriers   Psychosocial barriers to participate in program There are no identifiable barriers or psychosocial needs.      Screening Interventions   Interventions Encouraged to exercise             Quality of Life Scores:  Scores of 19 and below usually indicate a poorer quality of life in these areas.  A difference of  2-3 points is a clinically meaningful difference.  A difference of 2-3 points in the total score of the Quality of Life Index has been associated with significant improvement in overall quality of life, self-image, physical symptoms, and general health in studies assessing change in quality of life.  PHQ-9: Recent Review Flowsheet Data     Depression screen [facility] 2/9  02/26/2021   Decreased Interest 0   Down, Depressed, Hopeless 0   PHQ - 2 Score 0   Altered sleeping 0   Tired, decreased energy 1   Change in appetite 0   Feeling bad or failure about yourself  1   Trouble concentrating 0    Moving slowly or fidgety/restless 0   Suicidal thoughts 0   PHQ-9 Score 2   Difficult doing work/chores Very difficult      Interpretation of Total Score  Total Score Depression Severity:  1-4 = Minimal depression, 5-9 = Mild depression, 10-14 = Moderate depression, 15-19 = Moderately severe depression, 20-27 = Severe depression   Psychosocial Evaluation and Intervention:  Psychosocial Evaluation - 02/26/21 1323       Psychosocial Evaluation & Interventions   Interventions Encouraged to exercise with the program and follow exercise prescription    Comments History of anxiety and depression on medications             Psychosocial Re-Evaluation:   Psychosocial Discharge (Final Psychosocial Re-Evaluation):   Education: Education Goals: Education classes will be provided on a weekly basis, covering required topics. Participant will state understanding/return demonstration of topics presented.  Learning Barriers/Preferences:  Learning Barriers/Preferences - 02/26/21 1038       Learning Barriers/Preferences   Learning Barriers Sight   wears glasses   Learning Preferences Audio;Computer/Internet;Group Instruction;Individual Instruction;Pictoral;Skilled Demonstration;Verbal Instruction;Video;Written Material             Education Topics: Risk Factor Reduction:  -Group instruction that is supported by a PowerPoint presentation. Instructor discusses the definition of a risk factor, different risk factors for pulmonary disease, and how the heart and lungs work together.     Nutrition for Pulmonary Patient:  -Group instruction provided by PowerPoint slides, verbal discussion, and written materials to support subject matter. The instructor gives an explanation and review of healthy diet recommendations, which includes a discussion on weight management, recommendations for fruit and vegetable consumption, as well as protein, fluid, caffeine, fiber, sodium, sugar, and  alcohol. Tips for eating when patients are short of breath are discussed.   Pursed Lip Breathing:  -Group instruction that is supported by demonstration and informational handouts. Instructor discusses the benefits of pursed lip and diaphragmatic breathing and detailed demonstration on how to preform both.     Oxygen Safety:  -Group instruction provided by PowerPoint, verbal discussion, and written material to support subject matter. There is an overview of "What is Oxygen" and "Why do we need it".  Instructor also reviews how to create a safe environment for oxygen use, the importance of using oxygen as prescribed, and the risks of noncompliance. There is a brief discussion on traveling with oxygen and resources the patient may utilize.   Oxygen Equipment:  -Group instruction provided by [facility] Staff utilizing handouts, written materials, and equipment demonstrations.   Signs and Symptoms:  -Group instruction provided by written material and verbal discussion to support subject matter. Warning signs and symptoms of infection, stroke, and heart attack are reviewed and when to call the physician/911 reinforced. Tips for preventing the spread of infection discussed.   Advanced Directives:  -Group instruction provided by verbal instruction and written material to support subject matter. Instructor reviews Advanced Directive laws and proper instruction for filling out document.   Pulmonary Video:  -Group video education that reviews the importance of medication and oxygen compliance, exercise, good nutrition, pulmonary hygiene, and pursed lip and diaphragmatic breathing for the pulmonary patient.   Exercise for  the Pulmonary Patient:  -Group instruction that is supported by a PowerPoint presentation. Instructor discusses benefits of exercise, core components of exercise, frequency, duration, and intensity of an exercise routine, importance of utilizing pulse oximetry during exercise,  safety while exercising, and options of places to exercise outside of rehab.     Pulmonary Medications:  -Verbally interactive group education provided by instructor with focus on inhaled medications and proper administration.   Anatomy and Physiology of the Respiratory System and Intimacy:  -Group instruction provided by PowerPoint, verbal discussion, and written material to support subject matter. Instructor reviews respiratory cycle and anatomical components of the respiratory system and their functions. Instructor also reviews differences in obstructive and restrictive respiratory diseases with examples of each. Intimacy, Sex, and Sexuality differences are reviewed with a discussion on how relationships can change when diagnosed with pulmonary disease. Common sexual concerns are reviewed.   MD DAY -A group question and answer session with a medical doctor that allows participants to ask questions that relate to their pulmonary disease state.   OTHER EDUCATION -Group or individual verbal, written, or video instructions that support the educational goals of the pulmonary rehab program.   Holiday Eating Survival Tips:  -Group instruction provided by PowerPoint slides, verbal discussion, and written materials to support subject matter. The instructor gives patients tips, tricks, and techniques to help them not only survive but enjoy the holidays despite the onslaught of food that accompanies the holidays.   Knowledge Questionnaire Score:  Knowledge Questionnaire Score - 02/26/21 1040       Knowledge Questionnaire Score   Pre Score 18/18             Core Components/Risk Factors/Patient Goals at Admission:  Personal Goals and Risk Factors at Admission - 02/26/21 1044       Core Components/Risk Factors/Patient Goals on Admission    Weight Management Weight Gain;Yes   wants to gain weight   Improve shortness of breath with ADL's Yes    Intervention Provide education,  individualized exercise plan and daily activity instruction to help decrease symptoms of SOB with activities of daily living.    Expected Outcomes Short Term: Improve cardiorespiratory fitness to achieve a reduction of symptoms when performing ADLs;Long Term: Be able to perform more ADLs without symptoms or delay the onset of symptoms             Core Components/Risk Factors/Patient Goals Review:    Core Components/Risk Factors/Patient Goals at Discharge (Final Review):    ITP Comments:   Comments: ITP REVIEW Pt is making expected progress toward pulmonary rehab goals after completing 0 sessions. Recommend continued exercise, life style modification, education, and utilization of breathing techniques to increase stamina and strength and decrease shortness of breath with exertion. Dr. Rodman Pickle is Medical Director for Pulmonary Rehab at [facility].

## 2021-03-04 ENCOUNTER — Telehealth: Payer: Self-pay | Admitting: Adult Health

## 2021-03-04 ENCOUNTER — Encounter (HOSPITAL_COMMUNITY): Payer: Medicare Other

## 2021-03-04 ENCOUNTER — Telehealth (HOSPITAL_COMMUNITY): Payer: Self-pay | Admitting: Internal Medicine

## 2021-03-04 NOTE — Telephone Encounter (Signed)
Primary Pulmonologist: Sood Last office visit and with whom: 01/27/21 with TP What do we see them for (pulmonary problems): emphysema Last OV assessment/plan:  Assessment & Plan Note by Melvenia Needles, NP at 01/27/2021 12:17 PM  Author: Melvenia Needles, NP Author Type: Nurse Practitioner Filed: 01/27/2021 12:18 PM  Note Status: Written Cosign: Cosign Not Required Encounter Date: 01/27/2021  Problem: COPD with emphysema  Editor: Melvenia Needles, NP (Nurse Practitioner)               Recent exacerbation-has completed 2 courses of antibiotics and steroids.  She is starting to have clinical improvement.  Chest x-ray suspicious for possible early pneumonia.  We will repeat chest x-ray on return visit in 6 to 8 weeks.   Plan  Patient Instructions  Continue on TRELEGY 1 puff daily, brush/rinse and gargle after use.  Mycelex troche five times a day for 1 week.  Mucinex DM Twice daily  As needed  cough/congestion  Albuterol inhaler and neb as needed  Follow up with Dr. Halford Chessman  as planned and As needed   Please contact office for sooner follow up if symptoms do not improve or worsen or seek emergency care        Was appointment offered to patient (explain)?  Wants an in-office visit if okayed   Reason for call: Called and spoke with pt who states that she has had pna 3 times since October 2022.  Pt states that she currently has a temp of 101 and stated that the temp first began around 12pm 12/7. States that she has been taking tylenol which will help bring her temp down.  Pt has complaints of cough which is productive but states she has a hard time trying to get the phlegm out. States that she also has had complaints of wheezing as well as increased SOB.  Pt has been taking mucinex to see if that would help with the cough and breaking up the phlegm. Pt is using her Trelegy inhaler as prescribed. Pt has had to use her rescue inhaler at least twice daily, states that she has not used her  nebulizer any.  Pt said that she was not able to do a covid test as the ones that she currently has at home are defective but said that her landlord is going to be bringing her some more.  Pt wants to come in for an appt if it is okay for her to have an in-office visit. [name], please advise if you would be okay with pt having an in-office visit due to her history of pna or if we need to schedule a virtual visit.   Allergies  Allergen Reactions   Levaquin [Levofloxacin] Other (See Comments)    tendonitis   Penicillins Swelling     Did it involve swelling of the face/tongue/throat, SOB, or low BP? Unknown Did it involve sudden or severe rash/hives, skin peeling, or any reaction on the inside of your mouth or nose? Unknown Did you need to seek medical attention at a hospital or doctor's office? Unknown When did it last happen?  young child     If all above answers are "NO", may proceed with cephalosporin use.    Sulfa Antibiotics Nausea And Vomiting    Immunization History  Administered Date(s) Administered   Fluad Quad(high Dose 65+) 12/16/2019   Influenza Split 12/26/2016, 01/02/2018, 11/27/2018   Influenza Whole 12/27/2011, 12/04/2012   Influenza,inj,Quad PF,6+ Mos 11/27/2014, 12/27/2015, 12/17/2018   Influenza-Unspecified 12/09/2013  PFIZER(Purple Top)SARS-COV-2 Vaccination 06/06/2019, 06/27/2019, 03/13/2020   Pneumococcal Conjugate-13 11/17/2015   Pneumococcal Polysaccharide-23 03/28/2005, 01/09/2013   Zoster, Live 09/26/2014

## 2021-03-04 NOTE — Telephone Encounter (Signed)
She has an appointment for tomorrow will that be okay?  Fluids and rest  Tylenol for fever,  Can swab for flu tomorrow in office , let staff know she is febrile to come back to exam room if able  Please contact office for sooner follow up if symptoms do not improve or worsen or seek emergency care

## 2021-03-04 NOTE — Telephone Encounter (Signed)
Called and spoke with pt letting her know the info per TP and she verbalized understanding. Stated to her if she is still running a temp that we would have her go directly back to an exam room instead of waiting out in the lobby. Nothing further needed.

## 2021-03-04 NOTE — Telephone Encounter (Signed)
Pt has had pnumonia 3-4 times this past month. Pt states her home covid tests are defective and doesnt know where to go for a test. I suggested she go to a pharmacy and pt states she doesnt drive nor does she want to go out. Asked pt for symptoms and she refused to give me any. Asked pt for preferred pharmacy and she stated she didn't know. Pt just wants to come in for appt, but I did tell her that the nurses would want her to test for covid prior to coming in. Please call back and discuss with her.

## 2021-03-05 ENCOUNTER — Encounter: Payer: Self-pay | Admitting: Adult Health

## 2021-03-05 ENCOUNTER — Telehealth (HOSPITAL_COMMUNITY): Payer: Self-pay | Admitting: Internal Medicine

## 2021-03-05 ENCOUNTER — Ambulatory Visit (INDEPENDENT_AMBULATORY_CARE_PROVIDER_SITE_OTHER): Payer: Medicare Other

## 2021-03-05 ENCOUNTER — Other Ambulatory Visit: Payer: Self-pay

## 2021-03-05 ENCOUNTER — Ambulatory Visit (INDEPENDENT_AMBULATORY_CARE_PROVIDER_SITE_OTHER): Payer: Medicare Other | Admitting: Adult Health

## 2021-03-05 VITALS — BP 128/66 | HR 107 | Temp 98.6°F | Ht 66.0 in | Wt 121.2 lb

## 2021-03-05 DIAGNOSIS — J9611 Chronic respiratory failure with hypoxia: Secondary | ICD-10-CM | POA: Diagnosis not present

## 2021-03-05 DIAGNOSIS — J189 Pneumonia, unspecified organism: Secondary | ICD-10-CM

## 2021-03-05 DIAGNOSIS — J441 Chronic obstructive pulmonary disease with (acute) exacerbation: Secondary | ICD-10-CM | POA: Diagnosis not present

## 2021-03-05 DIAGNOSIS — R059 Cough, unspecified: Secondary | ICD-10-CM | POA: Diagnosis not present

## 2021-03-05 LAB — POC COVID19 BINAXNOW: SARS Coronavirus 2 Ag: NEGATIVE

## 2021-03-05 LAB — POCT INFLUENZA A/B
Influenza A, POC: NEGATIVE
Influenza B, POC: NEGATIVE

## 2021-03-05 MED ORDER — AZITHROMYCIN 500 MG PO TABS: 500.0000 mg | ORAL_TABLET | Freq: Every day | ORAL | 0 refills | Status: DC

## 2021-03-05 MED ORDER — METHYLPREDNISOLONE ACETATE 80 MG/ML IJ SUSP
80.0000 mg | Freq: Once | INTRAMUSCULAR | Status: AC
  Administered 2021-03-05: 80 mg via INTRAMUSCULAR

## 2021-03-05 MED ORDER — PREDNISONE 10 MG PO TABS: ORAL_TABLET | ORAL | 0 refills | Status: DC

## 2021-03-05 MED ORDER — ALBUTEROL SULFATE (2.5 MG/3ML) 0.083% IN NEBU: 2.5000 mg | INHALATION_SOLUTION | Freq: Four times a day (QID) | RESPIRATORY_TRACT | 3 refills | Status: AC | PRN

## 2021-03-05 NOTE — Assessment & Plan Note (Signed)
Acute COPD exacerbation plus or minus superimposed pneumonia. Vital signs are stable.  However patient is high risk for decompensation with very severe COPD.  We discussed hospitalization patient declines at this time.  We went over potential risk of COPD flare.  She adamantly does not want to go to the emergency room today  but says if her symptoms do not improve with outpatient treatment that she will go. Will treat with empiric antibiotics, steroids.  She was given a Depo-Medrol 80 mg IM injection in the office. Sputum culture is pending Patient has multiple antibiotic allergies.  We will treat with a 7-day course of azithromycin.  Have close follow-up in 1 week.  Plan  Patient Instructions  Sputum culture today .  Azithromycin 500mg  daily for 7 days -take with food.  Prednisone taper over next week.  Continue on TRELEGY 1 puff daily, rinse after use Begin Albuterol Neb Twice daily  and As needed  every 4-6 hr as needed  Mucinex DM Twice daily  As needed  cough/congestion  Continue on Oxygen 2 l/m with activity and At bedtime   Albuterol inhaler As needed   Tylenol As needed  fever .  Activity as tolerated.  Follow up with 1 week with Dr. Halford Chessman  and As needed   Please contact office for sooner follow up if symptoms do not improve or worsen or seek emergency care

## 2021-03-05 NOTE — Progress Notes (Signed)
@Patient  ID: Brandy Russell, female    DOB: 1955/11/05, 65 y.o.   MRN: [id number]  Chief Complaint  Patient presents with   Follow-up    Referring provider: Haywood Pao, MD  HPI: 65 year old female former smoker followed for COPD, lung nodule, chronic hypoxic respiratory failure on oxygen  TEST/EVENTS :  Spirometry 05/30/12 >> FEV1 0.69 (26%), FEV1% 40 A1AT 05/30/12 >> 112, PI*MS ONO with RA 07/03/12 >> Test time 8 hrs 33 min.  Mean SpO2 92.6%, low SpO2 87%.  Spent 8 min with SpO2 < 88%. Sputum 07/23/15 >> Pseudomonas aeruginosa ONO with RA 03/13/18 >> test time 9 hrs 45 min.  Baseline SpO2 88%, low SpO2 81%.  Spent 5 hrs 57 min with SpO2 < 88%. ONO with RA 05/09/18 >> test time 10 hrs 1 min.  Basal SpO2 89.8%, low SpO2 65%.  Spent 202.8 min with SpO2 < 88%. ONO with 2 liters 06/12/18 >> test time 8 hrs 6 min.  Basal SpO2 97.5%, SpO2 low 92%.   Chest imaging:  CT chest 04/04/09 >> apical centrilobular emphysema, 3 mm RUL nodule, 4 mm RUL nodule Low dose CT chest 10/03/14 >> multiple pulmonary nodules up to 5 mm, mild centrilobular and paraseptal emphysema Low dose CT chest 10/06/15 >> atherosclerosis, mod centrilobular emphysema, bronchial wall thickening, scattered nodules up to 3.6 mm Esophagram 11/25/15 >> no specific abnormality Low dose CT chest 10/06/16 >> no change Low dose CT chest 03/23/18 >> atherosclerosis, multiple nodules with new nodule 7.7 mm RUL, centrilobular and paraseptal emphysema, 3 cm lesion in left lobe of liver, possible dilated bile duct LDCT chest 08/24/18 >> atherosclerosis, numerous tiny b/l nodules up to 7.7 mm, centrilobular emphysema, 2.8 cm density in liver, 5 mm stone in Lt kidney LDCT chest 08/27/19 >> new RUL nodule 7 mm, diffuse bronchial thickening, mod centrilobular and paraseptal emphysema, other nodules stable   Cardiac tests:  Echo 02/08/05 >> EF 40 to 50% Echo 03/30/18 >> EF 55 to 60%, grade 2 DD     03/05/2021 Acute OV : COPD  Patient  presents for an acute office visit.  Patient complains over the last 3 to 4 days she has developed a fever T-max of 101, increased cough with thick mucus general malaise body aches fatigue. Patient has been having difficulty with recurrent COPD exacerbations.  She was treated with Z-Pak and a prednisone taper in October.  CT chest January 04, 2021 showed moderate emphysema with a left lower lobe patchy opacity.  She continued to have ongoing symptoms and was treated with doxycycline and a prednisone taper.  On return visit last month she was having some improvement.  She was continued on her maintenance regimen with Trelegy  And oxygen 2 L with activity and at bedtime. Today in the office influenza test is negative , COVID 19 test is negative .  Chest x-ray today shows patchy bilateral mid lung opacities.  She is had no increased oxygen demands.  Appetite is decreased but she is eating and drinking.  No nausea vomiting or diarrhea. Patient lives alone.  Drives. Restarted Pulmonary rehab this week.    Allergies  Allergen Reactions   Levaquin [Levofloxacin] Other (See Comments)    tendonitis   Penicillins Swelling     Did it involve swelling of the face/tongue/throat, SOB, or low BP? Unknown Did it involve sudden or severe rash/hives, skin peeling, or any reaction on the inside of your mouth or nose? Unknown Did you need to seek medical attention  at a hospital or doctor's office? Unknown When did it last happen?  young child     If all above answers are "NO", may proceed with cephalosporin use.    Sulfa Antibiotics Nausea And Vomiting    Immunization History  Administered Date(s) Administered   Fluad Quad(high Dose 65+) 12/16/2019   Influenza Split 12/26/2016, 01/02/2018, 11/27/2018   Influenza Whole 12/27/2011, 12/04/2012   Influenza,inj,Quad PF,6+ Mos 11/27/2014, 12/27/2015, 12/17/2018   Influenza-Unspecified 12/09/2013   PFIZER(Purple Top)SARS-COV-2 Vaccination 06/06/2019, 06/27/2019,  03/13/2020   Pneumococcal Conjugate-13 11/17/2015   Pneumococcal Polysaccharide-23 03/28/2005, 01/09/2013   Zoster, Live 09/26/2014    Past Medical History:  Diagnosis Date   Allergy    Anxiety and depression    CAD (coronary artery disease)    incidental finding on CT scan for lung ca screening   Constipation    Depression    Emphysema    Emphysema of lung (HCC)    GERD (gastroesophageal reflux disease)    Herpes    face, uses suppresive therapy intermittently   Seasonal allergies     Tobacco History: Social History   Tobacco Use  Smoking Status Former   Packs/day: 2.00   Years: 35.00   Pack years: 70.00   Types: Cigarettes   Quit date: 03/28/2006   Years since quitting: 14.9  Smokeless Tobacco Never   Counseling given: Not Answered   Outpatient Medications Prior to Visit  Medication Sig Dispense Refill   acetaminophen (TYLENOL) 325 MG tablet Take 325 mg by mouth every 6 (six) hours as needed.     Albuterol Sulfate (PROAIR RESPICLICK) 588 (90 Base) MCG/ACT AEPB Inhale 2 puffs into the lungs every 6 (six) hours as needed. 3 each 1   clotrimazole (MYCELEX) 10 MG troche Take 1 tablet (10 mg total) by mouth 5 (five) times daily. 35 Troche 0   diclofenac sodium (VOLTAREN) 1 % GEL Apply 1 application topically daily as needed (pain).   2   FIBER PO Take 1 tablet by mouth 2 (two) times a day.     Fluticasone-Umeclidin-Vilant (TRELEGY ELLIPTA) 100-62.5-25 MCG/INH AEPB Inhale 1 puff into the lungs daily. 60 each 11   ibuprofen (ADVIL,MOTRIN) 200 MG tablet Take 400 mg by mouth every 6 (six) hours as needed for headache (pain).      omeprazole (PRILOSEC) 20 MG capsule Take 20 mg by mouth daily.     pravastatin (PRAVACHOL) 40 MG tablet Take 40 mg by mouth daily.     albuterol (PROVENTIL) (2.5 MG/3ML) 0.083% nebulizer solution Take 3 mLs (2.5 mg total) by nebulization every 6 (six) hours as needed for wheezing or shortness of breath. 75 mL 3   benzonatate (TESSALON) 100 MG capsule  Take 2 capsules (200 mg total) by mouth 3 (three) times daily as needed for cough. (Patient not taking: Reported on 03/05/2021) 30 capsule 1   buPROPion (WELLBUTRIN SR) 150 MG 12 hr tablet Take 1 tablet (150 mg total) by mouth 2 (two) times daily. 180 tablet 1   doxycycline (VIBRA-TABS) 100 MG tablet Take 1 tablet (100 mg total) by mouth 2 (two) times daily. (Patient not taking: Reported on 03/05/2021) 14 tablet 0   gabapentin (NEURONTIN) 100 MG capsule Take 1 capsule (100 mg total) by mouth 3 (three) times daily. 270 capsule 0   predniSONE (DELTASONE) 20 MG tablet Take 1 tablet (20 mg total) by mouth daily with breakfast. (Patient not taking: Reported on 03/05/2021) 5 tablet 0   No facility-administered medications prior to visit.  Review of Systems:   Constitutional:   No  weight loss, night sweats,  +Fevers, chills, fatigue, or  lassitude.  HEENT:   No headaches,  Difficulty swallowing,  Tooth/dental problems, or  Sore throat,                No sneezing, itching, ear ache, nasal congestion, post nasal drip,   CV:  No chest pain,  Orthopnea, PND, swelling in lower extremities, anasarca, dizziness, palpitations, syncope.   GI  No heartburn, indigestion, abdominal pain, nausea, vomiting, diarrhea, change in bowel habits, loss of appetite, bloody stools.   Resp:   No chest wall deformity  Skin: no rash or lesions.  GU: no dysuria, change in color of urine, no urgency or frequency.  No flank pain, no hematuria   MS:  No joint pain or swelling.  No decreased range of motion.  No back pain.    Physical Exam  BP 128/66 (BP Location: Left Arm, Patient Position: Sitting, Cuff Size: Normal)   Pulse (!) 107   Temp 98.6 F (37 C) (Oral)   Ht 5\' 6"  (1.676 m)   Wt 121 lb 3.2 oz (55 kg)   SpO2 94%   BMI 19.56 kg/m   GEN: A/Ox3; pleasant , NAD, elderly    HEENT:  [place]/AT,  NOSE-clear, THROAT-clear, no lesions, no postnasal drip or exudate noted.   NECK:  Supple w/ fair ROM; no JVD;  normal carotid impulses w/o bruits; no thyromegaly or nodules palpated; no lymphadenopathy.    RESP scattered rhonchi bilaterally speaks in full sentences  no accessory muscle use, no dullness to percussion  CARD:  RRR, no m/r/g, no peripheral edema, pulses intact, no cyanosis or clubbing.  GI:   Soft & nt; nml bowel sounds; no organomegaly or masses detected.   Musco: Warm bil, no deformities or joint swelling noted.   Neuro: alert, no focal deficits noted.    Skin: Warm, no lesions or rashes    Lab Results:     BNP    Imaging: DG Chest 2 View  Result Date: 03/05/2021 CLINICAL DATA:  Cough EXAM: CHEST - 2 VIEW COMPARISON:  Multiple prior chest radiographs FINDINGS: Unchanged cardiomediastinal silhouette. There are patchy bilateral mid lung opacities with correlate on the lateral view. There are overlying nipple shadows. No pleural effusion. No visible pneumothorax. Pulmonary hyperinflation. No acute osseous abnormality. IMPRESSION: Patchy bilateral mid lung opacities which could represent developing infection. Electronically Signed   By: Maurine Simmering M.D.   On: 03/05/2021 10:10    methylPREDNISolone acetate (DEPO-MEDROL) injection 80 mg     Date Action Dose Route User   01/19/2021 1155 Given 80 mg Intramuscular (Right Ventrogluteal) Nolon Stalls, Kimber Relic, RN      methylPREDNISolone acetate (DEPO-MEDROL) injection 80 mg     Date Action Dose Route User   03/05/2021 1045 Given 80 mg Intramuscular (Right Upper Outer Quadrant) Logan, Princess N, CMA       No flowsheet data found.  No results found for: NITRICOXIDE      Assessment & Plan:   COPD with acute exacerbation ([place]) Acute COPD exacerbation plus or minus superimposed pneumonia. Vital signs are stable.  However patient is high risk for decompensation with very severe COPD.  We discussed hospitalization patient declines at this time.  We went over potential risk of COPD flare.  She adamantly does not want to  go to the emergency room today  but says if her symptoms do not improve with outpatient treatment  that she will go. Will treat with empiric antibiotics, steroids.  She was given a Depo-Medrol 80 mg IM injection in the office. Sputum culture is pending Patient has multiple antibiotic allergies.  We will treat with a 7-day course of azithromycin.  Have close follow-up in 1 week.  Plan  Patient Instructions  Sputum culture today .  Azithromycin 500mg  daily for 7 days -take with food.  Prednisone taper over next week.  Continue on TRELEGY 1 puff daily, rinse after use Begin Albuterol Neb Twice daily  and As needed  every 4-6 hr as needed  Mucinex DM Twice daily  As needed  cough/congestion  Continue on Oxygen 2 l/m with activity and At bedtime   Albuterol inhaler As needed   Tylenol As needed  fever .  Activity as tolerated.  Follow up with 1 week with Dr. Halford Chessman  and As needed   Please contact office for sooner follow up if symptoms do not improve or worsen or seek emergency care         Chronic respiratory failure with hypoxia ([place]) Maintained on current regimen.  Continue on oxygen to maintain O2 saturations greater than 88 to 90%.    I spent   41 minutes dedicated to the care of this patient on the date of this encounter to include pre-visit review of records, face-to-face time with the patient discussing conditions above, post visit ordering of testing, clinical documentation with the electronic health record, making appropriate referrals as documented, and communicating necessary findings to members of the patients care team.   [name], NP 03/05/2021

## 2021-03-05 NOTE — Assessment & Plan Note (Signed)
Check sputum culture.  Treat with empiric antibiotics azithromycin x7 days.  Close follow-up in 1 week.  Plan  Patient Instructions  Sputum culture today .  Azithromycin 500mg  daily for 7 days -take with food.  Prednisone taper over next week.  Continue on TRELEGY 1 puff daily, rinse after use Begin Albuterol Neb Twice daily  and As needed  every 4-6 hr as needed  Mucinex DM Twice daily  As needed  cough/congestion  Continue on Oxygen 2 l/m with activity and At bedtime   Albuterol inhaler As needed   Tylenol As needed  fever .  Activity as tolerated.  Follow up with 1 week with Dr. Halford Chessman  and As needed   Please contact office for sooner follow up if symptoms do not improve or worsen or seek emergency care

## 2021-03-05 NOTE — Progress Notes (Signed)
Reviewed and agree with assessment/plan.   Chesley Mires, MD [facility] Pulmonary/Critical Care 03/05/2021, 11:56 AM Pager:  [phone]

## 2021-03-05 NOTE — Patient Instructions (Addendum)
Sputum culture today .  Azithromycin 500mg  daily for 7 days -take with food.  Prednisone taper over next week.  Continue on TRELEGY 1 puff daily, rinse after use Begin Albuterol Neb Twice daily  and As needed  every 4-6 hr as needed  Mucinex DM Twice daily  As needed  cough/congestion  Continue on Oxygen 2 l/m with activity and At bedtime   Albuterol inhaler As needed   Tylenol As needed  fever .  Activity as tolerated.  Follow up with 1 week with Dr. Halford Chessman  and As needed   Please contact office for sooner follow up if symptoms do not improve or worsen or seek emergency care

## 2021-03-05 NOTE — Telephone Encounter (Signed)
Will route to TP to make her aware.

## 2021-03-05 NOTE — Assessment & Plan Note (Signed)
Maintained on current regimen.  Continue on oxygen to maintain O2 saturations greater than 88 to 90%.

## 2021-03-09 ENCOUNTER — Encounter (HOSPITAL_COMMUNITY): Payer: Medicare Other

## 2021-03-10 ENCOUNTER — Telehealth (HOSPITAL_COMMUNITY): Payer: Self-pay | Admitting: Internal Medicine

## 2021-03-11 ENCOUNTER — Encounter (HOSPITAL_COMMUNITY): Payer: Medicare Other

## 2021-03-12 ENCOUNTER — Other Ambulatory Visit: Payer: Self-pay

## 2021-03-12 ENCOUNTER — Encounter: Payer: Self-pay | Admitting: Adult Health

## 2021-03-12 ENCOUNTER — Ambulatory Visit (INDEPENDENT_AMBULATORY_CARE_PROVIDER_SITE_OTHER): Payer: Medicare Other | Admitting: Adult Health

## 2021-03-12 VITALS — BP 112/68 | HR 96 | Temp 98.0°F | Ht 66.0 in | Wt 117.4 lb

## 2021-03-12 DIAGNOSIS — J441 Chronic obstructive pulmonary disease with (acute) exacerbation: Secondary | ICD-10-CM

## 2021-03-12 DIAGNOSIS — J189 Pneumonia, unspecified organism: Secondary | ICD-10-CM

## 2021-03-12 DIAGNOSIS — J9611 Chronic respiratory failure with hypoxia: Secondary | ICD-10-CM | POA: Diagnosis not present

## 2021-03-12 NOTE — Progress Notes (Signed)
Error note

## 2021-03-12 NOTE — Assessment & Plan Note (Signed)
Patchy opacities bilaterally lungs consistent with possible superimposed pneumonia.  Sputum culture is pending.  She has completed a full course of antibiotics.  CT chest is planned for next month  Plan  Patient Instructions  Sputum culture today .  Finish Prednisone taper .  Stop TRELEGY  Begin Budesonide and Brovana Neb Twice daily   Begin Yupelri Neb daily   Albuterol Neb every 4-6 hr as needed  Mucinex DM Twice daily  As needed  cough/congestion  Continue on Oxygen 2 l/m with activity and At bedtime   Albuterol inhaler As needed   Activity as tolerated.  Dental follow up.  Begin flutter valve Twice daily   CT chest next month as planned  Follow up with 4 week with Dr. Halford Chessman  and As needed   Please contact office for sooner follow up if symptoms do not improve or worsen or seek emergency care

## 2021-03-12 NOTE — Assessment & Plan Note (Signed)
Recurrent COPD exacerbation slowly resolving plus or minus superimposed pneumonia.  Patient is clinically making some improvement.  She has a CT chest scheduled for next month. Will change current medications with triple therapy maintenance inhaler over to nebulized triple therapy with budesonide, Brovana and Yupelri nebulizers Add flutter valve for mucociliary clearance. Check sputum culture.  And have her finish up her prednisone taper.  Patient is advised if symptoms do not continue to slowly improve or she worsens she is to call her office for sooner follow-up or go to the emergency room Did discuss palliative care however patient declines at this time. Plan  Patient Instructions  Sputum culture today .  Finish Prednisone taper .  Stop TRELEGY  Begin Budesonide and Brovana Neb Twice daily   Begin Yupelri Neb daily   Albuterol Neb every 4-6 hr as needed  Mucinex DM Twice daily  As needed  cough/congestion  Continue on Oxygen 2 l/m with activity and At bedtime   Albuterol inhaler As needed   Activity as tolerated.  Dental follow up.  Begin flutter valve Twice daily   CT chest next month as planned  Follow up with 4 week with Dr. Halford Chessman  and As needed   Please contact office for sooner follow up if symptoms do not improve or worsen or seek emergency care

## 2021-03-12 NOTE — Progress Notes (Signed)
@Patient  ID: Brandy Russell, female    DOB: December 15, 1955, 65 y.o.   MRN: [id number]  Chief Complaint  Patient presents with   Follow-up    Referring provider: Haywood Pao, MD  HPI: 65 year old female former smoker followed for very severe COPD, lung nodule and chronic hypoxic respiratory failure on oxygen  TEST/EVENTS :  Spirometry 05/30/12 >> FEV1 0.69 (26%), FEV1% 40 A1AT 05/30/12 >> 112, PI*MS ONO with RA 07/03/12 >> Test time 8 hrs 33 min.  Mean SpO2 92.6%, low SpO2 87%.  Spent 8 min with SpO2 < 88%. Sputum 07/23/15 >> Pseudomonas aeruginosa ONO with RA 03/13/18 >> test time 9 hrs 45 min.  Baseline SpO2 88%, low SpO2 81%.  Spent 5 hrs 57 min with SpO2 < 88%. ONO with RA 05/09/18 >> test time 10 hrs 1 min.  Basal SpO2 89.8%, low SpO2 65%.  Spent 202.8 min with SpO2 < 88%. ONO with 2 liters 06/12/18 >> test time 8 hrs 6 min.  Basal SpO2 97.5%, SpO2 low 92%.   Chest imaging:  CT chest 04/04/09 >> apical centrilobular emphysema, 3 mm RUL nodule, 4 mm RUL nodule Low dose CT chest 10/03/14 >> multiple pulmonary nodules up to 5 mm, mild centrilobular and paraseptal emphysema Low dose CT chest 10/06/15 >> atherosclerosis, mod centrilobular emphysema, bronchial wall thickening, scattered nodules up to 3.6 mm Esophagram 11/25/15 >> no specific abnormality Low dose CT chest 10/06/16 >> no change Low dose CT chest 03/23/18 >> atherosclerosis, multiple nodules with new nodule 7.7 mm RUL, centrilobular and paraseptal emphysema, 3 cm lesion in left lobe of liver, possible dilated bile duct LDCT chest 08/24/18 >> atherosclerosis, numerous tiny b/l nodules up to 7.7 mm, centrilobular emphysema, 2.8 cm density in liver, 5 mm stone in Lt kidney LDCT chest 08/27/19 >> new RUL nodule 7 mm, diffuse bronchial thickening, mod centrilobular and paraseptal emphysema, other nodules stable   Cardiac tests:  Echo 02/08/05 >> EF 40 to 50% Echo 03/30/18 >> EF 55 to 60%, grade 2 DD   03/12/2021 Follow up :  COPD, O2 RF  Patient returns for a 1 week follow-up.  Patient was seen last week for an acute COPD exacerbation, she is prone to recurrent flares is been having difficulty since early October with ongoing cough and congestion.  CT chest October 10 showed a left patchy opacity.  She was treated initially with doxycycline and prednisone.  Last week COVID and influenza test were negative.  Chest x-ray showed bilateral mid lung opacities.  She was started on azithromycin and a prednisone burst. Patient returns today she says she is feeling better but remains very weak with low energy and decreased activity tolerance.  Has a few days left on her prednisone taper.  She completed her antibiotic course.  Appetite has been decreased but she says she is drinking fluids and has been using boost.  She denies any hemoptysis, fever, chest pain, orthopnea or increased edema. Sputum culture was requested last visit she says she was unable to do that but she will return at next week. Patient remains independent and drives and lives at home alone.  We discussed the severity of her COPD with previous FEV1 being as low as 26%.  She is high risk for decompensation.  Talked about community resources such as palliative care to monitor her at home.  Patient declines at this time. Says that she is going to start pulmonary rehab back again next week.   Allergies  Allergen Reactions  Levaquin [Levofloxacin] Other (See Comments)    tendonitis   Penicillins Swelling     Did it involve swelling of the face/tongue/throat, SOB, or low BP? Unknown Did it involve sudden or severe rash/hives, skin peeling, or any reaction on the inside of your mouth or nose? Unknown Did you need to seek medical attention at a hospital or doctor's office? Unknown When did it last happen?  young child     If all above answers are "NO", may proceed with cephalosporin use.    Sulfa Antibiotics Nausea And Vomiting    Immunization History   Administered Date(s) Administered   Fluad Quad(high Dose 65+) 12/16/2019   Influenza Split 12/26/2016, 01/02/2018, 11/27/2018   Influenza Whole 12/27/2011, 12/04/2012   Influenza, High Dose Seasonal PF 01/09/2015   Influenza, Quadrivalent, Recombinant, Inj, Pf 12/18/2017   Influenza,inj,Quad PF,6+ Mos 11/27/2014, 12/27/2015, 12/17/2018   Influenza-Unspecified 12/09/2013, 12/14/2015, 12/15/2016   PFIZER(Purple Top)SARS-COV-2 Vaccination 06/06/2019, 06/27/2019, 03/13/2020   Pneumococcal Conjugate-13 11/17/2015   Pneumococcal Polysaccharide-23 03/28/2005, 01/09/2013   Zoster, Live 09/26/2014    Past Medical History:  Diagnosis Date   Allergy    Anxiety and depression    CAD (coronary artery disease)    incidental finding on CT scan for lung ca screening   Constipation    Depression    Emphysema    Emphysema of lung (HCC)    GERD (gastroesophageal reflux disease)    Herpes    face, uses suppresive therapy intermittently   Seasonal allergies     Tobacco History: Social History   Tobacco Use  Smoking Status Former   Packs/day: 2.00   Years: 35.00   Pack years: 70.00   Types: Cigarettes   Quit date: 03/28/2006   Years since quitting: 14.9  Smokeless Tobacco Never   Counseling given: Not Answered   Outpatient Medications Prior to Visit  Medication Sig Dispense Refill   acetaminophen (TYLENOL) 325 MG tablet Take 325 mg by mouth every 6 (six) hours as needed.     albuterol (PROVENTIL) (2.5 MG/3ML) 0.083% nebulizer solution Take 3 mLs (2.5 mg total) by nebulization every 6 (six) hours as needed for wheezing or shortness of breath. 75 mL 3   Albuterol Sulfate (PROAIR RESPICLICK) 283 (90 Base) MCG/ACT AEPB Inhale 2 puffs into the lungs every 6 (six) hours as needed. 3 each 1   azithromycin (ZITHROMAX) 500 MG tablet Take 1 tablet (500 mg total) by mouth daily. 7 tablet 0   benzonatate (TESSALON) 100 MG capsule Take 2 capsules (200 mg total) by mouth 3 (three) times daily as  needed for cough. 30 capsule 1   clotrimazole (MYCELEX) 10 MG troche Take 1 tablet (10 mg total) by mouth 5 (five) times daily. 35 Troche 0   diclofenac sodium (VOLTAREN) 1 % GEL Apply 1 application topically daily as needed (pain).   2   doxycycline (VIBRA-TABS) 100 MG tablet Take 1 tablet (100 mg total) by mouth 2 (two) times daily. 14 tablet 0   FIBER PO Take 1 tablet by mouth 2 (two) times a day.     Fluticasone-Umeclidin-Vilant (TRELEGY ELLIPTA) 100-62.5-25 MCG/INH AEPB Inhale 1 puff into the lungs daily. 60 each 11   ibuprofen (ADVIL,MOTRIN) 200 MG tablet Take 400 mg by mouth every 6 (six) hours as needed for headache (pain).      omeprazole (PRILOSEC) 20 MG capsule Take 20 mg by mouth daily.     pravastatin (PRAVACHOL) 40 MG tablet Take 40 mg by mouth daily.  predniSONE (DELTASONE) 10 MG tablet 4 tabs for 2 days, then 3 tabs for 2 days, 2 tabs for 2 days, then 1 tab for 2 days, then stop 20 tablet 0   buPROPion (WELLBUTRIN SR) 150 MG 12 hr tablet Take 1 tablet (150 mg total) by mouth 2 (two) times daily. 180 tablet 1   gabapentin (NEURONTIN) 100 MG capsule Take 1 capsule (100 mg total) by mouth 3 (three) times daily. 270 capsule 0   No facility-administered medications prior to visit.     Review of Systems:   Constitutional:   No  weight loss, night sweats,  Fevers, chills,  +fatigue, or  lassitude.  HEENT:   No headaches,  Difficulty swallowing,  Tooth/dental problems, or  Sore throat,                No sneezing, itching, ear ache, nasal congestion, post nasal drip,   CV:  No chest pain,  Orthopnea, PND, swelling in lower extremities, anasarca, dizziness, palpitations, syncope.   GI  No heartburn, indigestion, abdominal pain, nausea, vomiting, diarrhea, change in bowel habits, loss of appetite, bloody stools.   Resp:  No chest wall deformity  Skin: no rash or lesions.  GU: no dysuria, change in color of urine, no urgency or frequency.  No flank pain, no hematuria   MS:   No joint pain or swelling.  No decreased range of motion.  No back pain.    Physical Exam  BP 112/68 (BP Location: Left Arm, Patient Position: Sitting, Cuff Size: Normal)    Pulse 96    Temp 98 F (36.7 C) (Oral)    Ht 5\' 6"  (1.676 m)    Wt 117 lb 6.4 oz (53.3 kg)    SpO2 96%    BMI 18.95 kg/m   GEN: A/Ox3; pleasant , NAD, thin chronically ill-appearing on oxygen   HEENT:  [place]/AT,  NOSE-clear, THROAT-clear, no lesions, no postnasal drip or exudate noted.  Poor dentition  NECK:  Supple w/ fair ROM; no JVD; normal carotid impulses w/o bruits; no thyromegaly or nodules palpated; no lymphadenopathy.    RESP scattered rhonchi bilaterally  no accessory muscle use, no dullness to percussion  CARD:  RRR, no m/r/g, no peripheral edema, pulses intact, no cyanosis or clubbing.  GI:   Soft & nt; nml bowel sounds; no organomegaly or masses detected.   Musco: Warm bil, no deformities or joint swelling noted.   Neuro: alert, no focal deficits noted.    Skin: Warm, no lesions or rashes    Lab Results:     Imaging: DG Chest 2 View  Result Date: 03/05/2021 CLINICAL DATA:  Cough EXAM: CHEST - 2 VIEW COMPARISON:  Multiple prior chest radiographs FINDINGS: Unchanged cardiomediastinal silhouette. There are patchy bilateral mid lung opacities with correlate on the lateral view. There are overlying nipple shadows. No pleural effusion. No visible pneumothorax. Pulmonary hyperinflation. No acute osseous abnormality. IMPRESSION: Patchy bilateral mid lung opacities which could represent developing infection. Electronically Signed   By: Maurine Simmering M.D.   On: 03/05/2021 10:10    methylPREDNISolone acetate (DEPO-MEDROL) injection 80 mg     Date Action Dose Route User   01/19/2021 1155 Given 80 mg Intramuscular (Right Ventrogluteal) Nolon Stalls, Kimber Relic, RN      methylPREDNISolone acetate (DEPO-MEDROL) injection 80 mg     Date Action Dose Route User   03/05/2021 1045 Given 80 mg Intramuscular  (Right Upper Outer Quadrant) Fenton Foy, [place]  No flowsheet data found.  No results found for: NITRICOXIDE      Assessment & Plan:   COPD with acute exacerbation ([place]) Recurrent COPD exacerbation slowly resolving plus or minus superimposed pneumonia.  Patient is clinically making some improvement.  She has a CT chest scheduled for next month. Will change current medications with triple therapy maintenance inhaler over to nebulized triple therapy with budesonide, Brovana and Yupelri nebulizers Add flutter valve for mucociliary clearance. Check sputum culture.  And have her finish up her prednisone taper.  Patient is advised if symptoms do not continue to slowly improve or she worsens she is to call her office for sooner follow-up or go to the emergency room Did discuss palliative care however patient declines at this time. Plan  Patient Instructions  Sputum culture today .  Finish Prednisone taper .  Stop TRELEGY  Begin Budesonide and Brovana Neb Twice daily   Begin Yupelri Neb daily   Albuterol Neb every 4-6 hr as needed  Mucinex DM Twice daily  As needed  cough/congestion  Continue on Oxygen 2 l/m with activity and At bedtime   Albuterol inhaler As needed   Activity as tolerated.  Dental follow up.  Begin flutter valve Twice daily   CT chest next month as planned  Follow up with 4 week with Dr. Halford Chessman  and As needed   Please contact office for sooner follow up if symptoms do not improve or worsen or seek emergency care         Chronic respiratory failure with hypoxia [facility]) Patient is to continue on oxygen at 2 L with activity and at rest.  To maintain O2 saturations greater than 88 to 90%.  Community acquired pneumonia Patchy opacities bilaterally lungs consistent with possible superimposed pneumonia.  Sputum culture is pending.  She has completed a full course of antibiotics.  CT chest is planned for next month  Plan  Patient Instructions  Sputum culture  today .  Finish Prednisone taper .  Stop TRELEGY  Begin Budesonide and Brovana Neb Twice daily   Begin Yupelri Neb daily   Albuterol Neb every 4-6 hr as needed  Mucinex DM Twice daily  As needed  cough/congestion  Continue on Oxygen 2 l/m with activity and At bedtime   Albuterol inhaler As needed   Activity as tolerated.  Dental follow up.  Begin flutter valve Twice daily   CT chest next month as planned  Follow up with 4 week with Dr. Halford Chessman  and As needed   Please contact office for sooner follow up if symptoms do not improve or worsen or seek emergency care           [name], NP 03/12/2021

## 2021-03-12 NOTE — Assessment & Plan Note (Signed)
Patient is to continue on oxygen at 2 L with activity and at rest.  To maintain O2 saturations greater than 88 to 90%.

## 2021-03-12 NOTE — Patient Instructions (Addendum)
Sputum culture today .  Finish Prednisone taper .  Stop TRELEGY  Begin Budesonide and Brovana Neb Twice daily   Begin Yupelri Neb daily   Albuterol Neb every 4-6 hr as needed  Mucinex DM Twice daily  As needed  cough/congestion  Continue on Oxygen 2 l/m with activity and At bedtime   Albuterol inhaler As needed   Activity as tolerated.  Dental follow up.  Begin flutter valve Twice daily   CT chest next month as planned  Follow up with 4 week with Dr. Halford Chessman  and As needed   Please contact office for sooner follow up if symptoms do not improve or worsen or seek emergency care

## 2021-03-15 NOTE — Progress Notes (Signed)
Reviewed and agree with assessment/plan.   Chesley Mires, MD [facility] Pulmonary/Critical Care 03/15/2021, 8:10 AM Pager:  [phone]

## 2021-03-16 ENCOUNTER — Encounter (HOSPITAL_COMMUNITY): Payer: Medicare Other

## 2021-03-18 ENCOUNTER — Encounter (HOSPITAL_COMMUNITY): Payer: Medicare Other

## 2021-03-23 ENCOUNTER — Encounter (HOSPITAL_COMMUNITY): Admission: RE | Admit: 2021-03-23 | Payer: Medicare Other | Source: Ambulatory Visit

## 2021-03-23 ENCOUNTER — Telehealth (HOSPITAL_COMMUNITY): Payer: Self-pay | Admitting: *Deleted

## 2021-03-23 NOTE — Telephone Encounter (Signed)
Called to check on patient since she has been absent from [place] with pneumonia. She states that she did not come in last week and today due to the weather being so cold and that she was weak from the pneumonia. She states that these days she is more "scared" when it comes to her going out in the cold weather.I suggested that she consider changing her class time to the afternoon so that the temperature would be warmer when she has to come out. Pt agrees to this and we will make the change.

## 2021-03-24 NOTE — Addendum Note (Signed)
Encounter addended by: Sheppard Plumber on: 03/24/2021 11:14 AM  Actions taken: Flowsheet data copied forward, Flowsheet accepted

## 2021-03-25 ENCOUNTER — Other Ambulatory Visit: Payer: Self-pay

## 2021-03-25 ENCOUNTER — Encounter (HOSPITAL_COMMUNITY)
Admission: RE | Admit: 2021-03-25 | Discharge: 2021-03-25 | Disposition: A | Discharge status: Home / Self Care | Payer: Medicare Other | Source: Ambulatory Visit | Attending: Pulmonary Disease | Admitting: Pulmonary Disease

## 2021-03-25 ENCOUNTER — Encounter (HOSPITAL_COMMUNITY): Payer: Medicare Other

## 2021-03-25 DIAGNOSIS — J432 Centrilobular emphysema: Secondary | ICD-10-CM | POA: Diagnosis not present

## 2021-03-25 NOTE — Progress Notes (Signed)
Daily Session Note ° °Patient Details  °Name: Brandy Russell °MRN: [id number] °Date of Birth: 07/21/1955 °Referring Provider:   °Flowsheet Row Pulmonary Rehab Walk Test from 02/26/2021 in [facility]  °Referring Provider Sood  ° °  ° ° °Encounter Date: 03/25/2021 ° °Check In: ° Session Check In - 03/25/21 1432   ° °  ° Check-In  ° Supervising physician immediately available to respond to emergencies [facility] immediately available   ° Physician(s) Dr. Ghimire   ° Location [facility]   ° Staff Present Lisa Hughes, RN;Kaylee Davis, MS, ACSM-CEP, Exercise Physiologist;David Makemson, MS, ACSM-CEP, CCRP, Exercise Physiologist;Jetta Walker BS, ACSM EP-C, Exercise Physiologist   ° Virtual Visit No   ° Medication changes reported     No   ° Fall or balance concerns reported    No   ° Tobacco Cessation No Change   ° Warm-up and Cool-down Performed as group-led instruction   ° Resistance Training Performed Yes   ° VAD Patient? No   ° PAD/SET Patient? No   °  ° Pain Assessment  ° Currently in Pain? No/denies   ° Multiple Pain Sites No   ° °  °  ° °  ° ° °Capillary Blood Glucose: °No results found for this or any previous visit (from the past 24 hour(s)). ° ° ° °Social History  ° °Tobacco Use  °Smoking Status Former  ° Packs/day: 2.00  ° Years: 35.00  ° Pack years: 70.00  ° Types: Cigarettes  ° Quit date: 03/28/2006  ° Years since quitting: 15.0  °Smokeless Tobacco Never  ° ° °Goals Met:  °Exercise tolerated well °No report of concerns or symptoms today °Strength training completed today ° °Goals Unmet:  °BP Pt's BP elevated today on arrival 170/90. She had taken her albuterol inhaler and finished the last day of her Prednisone taper today.BP at discharge 144/86. ° °Comments: Service time is from 1309 to 1450 ° ° ° °Dr. Jane Ellison is Medical Director for Pulmonary Rehab at [facility].  °

## 2021-03-30 ENCOUNTER — Encounter (HOSPITAL_COMMUNITY): Payer: Medicare Other

## 2021-03-30 ENCOUNTER — Other Ambulatory Visit: Payer: Self-pay

## 2021-03-30 ENCOUNTER — Encounter (HOSPITAL_COMMUNITY)
Admission: RE | Admit: 2021-03-30 | Discharge: 2021-03-30 | Disposition: A | Discharge status: Home / Self Care | Payer: Medicare Other | Source: Ambulatory Visit | Attending: Pulmonary Disease | Admitting: Pulmonary Disease

## 2021-03-30 VITALS — Wt 116.4 lb

## 2021-03-30 DIAGNOSIS — J432 Centrilobular emphysema: Secondary | ICD-10-CM | POA: Diagnosis present

## 2021-03-30 NOTE — Progress Notes (Signed)
Daily Session Note  Patient Details  Name: Brandy Russell MRN: [id number] Date of Birth: December 10, 1955 Referring Provider:   April Manson Pulmonary Rehab Walk Test from 02/26/2021 in [place]  Referring Provider Sood       Encounter Date: 03/30/2021  Check In:  Session Check In - 03/30/21 1411       Check-In   Supervising physician immediately available to respond to emergencies [facility] immediately available    Physician(s) Dr. Arbutus Ped    Location [facility]    Staff Present Rosebud Poles, RN, Quentin Ore, MS, ACSM-CEP, Exercise Physiologist;Lisa Ysidro Evert, RN    Virtual Visit No    Medication changes reported     No    Fall or balance concerns reported    No    Tobacco Cessation No Change    Warm-up and Cool-down Performed as group-led instruction    Resistance Training Performed Yes    VAD Patient? No    PAD/SET Patient? No      Pain Assessment   Currently in Pain? No/denies    Multiple Pain Sites No             Capillary Blood Glucose: No results found for this or any previous visit (from the past 24 hour(s)).    Social History   Tobacco Use  Smoking Status Former   Packs/day: 2.00   Years: 35.00   Pack years: 70.00   Types: Cigarettes   Quit date: 03/28/2006   Years since quitting: 15.0  Smokeless Tobacco Never    Goals Met:  Proper associated with RPD/PD & O2 Sat Exercise tolerated well No report of concerns or symptoms today Strength training completed today  Goals Unmet:  Not Applicable  Comments: Service time is from 1314 to 1430    Dr. Rodman Pickle is Medical Director for Pulmonary Rehab at [facility].

## 2021-03-31 NOTE — Progress Notes (Signed)
Pulmonary Individual Treatment Plan  Patient Details  Name: Brandy Russell MRN: [id number] Date of Birth: 1955-08-30 Referring Provider:   April Manson Pulmonary Rehab Walk Test from 02/26/2021 in [place]  Referring Provider [place]       Initial Encounter Date:  Flowsheet Row Pulmonary Rehab Walk Test from 02/26/2021 in [place]  Date 02/26/21       Visit Diagnosis: Centrilobular emphysema ([place])  Patient's Home Medications on Admission:   Current Outpatient Medications:    acetaminophen (TYLENOL) 325 MG tablet, Take 325 mg by mouth every 6 (six) hours as needed., Disp: , Rfl:    albuterol (PROVENTIL) (2.5 MG/3ML) 0.083% nebulizer solution, Take 3 mLs (2.5 mg total) by nebulization every 6 (six) hours as needed for wheezing or shortness of breath., Disp: 75 mL, Rfl: 3   Albuterol Sulfate (PROAIR RESPICLICK) 185 (90 Base) MCG/ACT AEPB, Inhale 2 puffs into the lungs every 6 (six) hours as needed., Disp: 3 each, Rfl: 1   azithromycin (ZITHROMAX) 500 MG tablet, Take 1 tablet (500 mg total) by mouth daily., Disp: 7 tablet, Rfl: 0   benzonatate (TESSALON) 100 MG capsule, Take 2 capsules (200 mg total) by mouth 3 (three) times daily as needed for cough., Disp: 30 capsule, Rfl: 1   buPROPion (WELLBUTRIN SR) 150 MG 12 hr tablet, Take 1 tablet (150 mg total) by mouth 2 (two) times daily., Disp: 180 tablet, Rfl: 1   clotrimazole (MYCELEX) 10 MG troche, Take 1 tablet (10 mg total) by mouth 5 (five) times daily., Disp: 35 Troche, Rfl: 0   diclofenac sodium (VOLTAREN) 1 % GEL, Apply 1 application topically daily as needed (pain). , Disp: , Rfl: 2   doxycycline (VIBRA-TABS) 100 MG tablet, Take 1 tablet (100 mg total) by mouth 2 (two) times daily., Disp: 14 tablet, Rfl: 0   FIBER PO, Take 1 tablet by mouth 2 (two) times a day., Disp: , Rfl:    Fluticasone-Umeclidin-Vilant (TRELEGY ELLIPTA) 100-62.5-25 MCG/INH AEPB, Inhale 1 puff  into the lungs daily., Disp: 60 each, Rfl: 11   gabapentin (NEURONTIN) 100 MG capsule, Take 1 capsule (100 mg total) by mouth 3 (three) times daily., Disp: 270 capsule, Rfl: 0   ibuprofen (ADVIL,MOTRIN) 200 MG tablet, Take 400 mg by mouth every 6 (six) hours as needed for headache (pain). , Disp: , Rfl:    omeprazole (PRILOSEC) 20 MG capsule, Take 20 mg by mouth daily., Disp: , Rfl:    pravastatin (PRAVACHOL) 40 MG tablet, Take 40 mg by mouth daily., Disp: , Rfl:    predniSONE (DELTASONE) 10 MG tablet, 4 tabs for 2 days, then 3 tabs for 2 days, 2 tabs for 2 days, then 1 tab for 2 days, then stop, Disp: 20 tablet, Rfl: 0  Past Medical History: Past Medical History:  Diagnosis Date   Allergy    Anxiety and depression    CAD (coronary artery disease)    incidental finding on CT scan for lung ca screening   Constipation    Depression    Emphysema    Emphysema of lung (HCC)    GERD (gastroesophageal reflux disease)    Herpes    face, uses suppresive therapy intermittently   Seasonal allergies     Tobacco Use: Social History   Tobacco Use  Smoking Status Former   Packs/day: 2.00   Years: 35.00   Pack years: 70.00   Types: Cigarettes   Quit date: 03/28/2006   Years  since quitting: 15.0  Smokeless Tobacco Never    Labs: Recent Review Flowsheet Data     Labs for ITP Cardiac and Pulmonary Rehab Latest Ref Rng & Units 04/16/2012 09/19/2013 11/11/2014   Cholestrol 0 - 200 mg/dL 278(H) 280(H) 250(H)   LDLCALC 0 - 99 mg/dL - 127(H) 106(H)   LDLDIRECT mg/dL 102.8 - -   HDL >39.00 mg/dL 142.90 141.00 133.20   Trlycerides 0.0 - 149.0 mg/dL 62.0 62.0 55.0   Hemoglobin A1c 4.6 - 6.5 % 5.4 5.5 -       Capillary Blood Glucose: Lab Results  Component Value Date   GLUCAP 93 06/21/2012     Pulmonary Assessment Scores:  Pulmonary Assessment Scores     Row Name 02/26/21 1112         ADL UCSD   ADL Phase Entry     SOB Score total 61       CAT Score   CAT Score 26       mMRC  Score   mMRC Score 61             UCSD: Self-administered rating of dyspnea associated with activities of daily living (ADLs) 6-point scale (0 = "not at all" to 5 = "maximal or unable to do because of breathlessness")  Scoring Scores range from 0 to 120.  Minimally important difference is 5 units  CAT: CAT can identify the health impairment of COPD patients and is better correlated with disease progression.  CAT has a scoring range of zero to 40. The CAT score is classified into four groups of low (less than 10), medium (10 - 20), high (21-30) and very high (31-40) based on the impact level of disease on health status. A CAT score over 10 suggests significant symptoms.  A worsening CAT score could be explained by an exacerbation, poor medication adherence, poor inhaler technique, or progression of COPD or comorbid conditions.  CAT MCID is 2 points  mMRC: mMRC (Modified Medical Research Council) Dyspnea Scale is used to assess the degree of baseline functional disability in patients of respiratory disease due to dyspnea. No minimal important difference is established. A decrease in score of 1 point or greater is considered a positive change.   Pulmonary Function Assessment:  Pulmonary Function Assessment - 02/26/21 1033       Breath   Bilateral Breath Sounds Clear    Shortness of Breath Yes;Fear of Shortness of Breath;Limiting activity             Exercise Target Goals: Exercise Program Goal: Individual exercise prescription set using results from initial 6 min walk test and THRR while considering  patients activity barriers and safety.   Exercise Prescription Goal: Initial exercise prescription builds to 30-45 minutes a day of aerobic activity, 2-3 days per week.  Home exercise guidelines will be given to patient during program as part of exercise prescription that the participant will acknowledge.  Activity Barriers & Risk Stratification:  Activity Barriers & Cardiac  Risk Stratification - 02/26/21 1046       Activity Barriers & Cardiac Risk Stratification   Activity Barriers Muscular Weakness;Shortness of Breath;Deconditioning    Cardiac Risk Stratification Low             6 Minute Walk:  6 Minute Walk     Row Name 02/26/21 1306         6 Minute Walk   Phase Initial     Distance 852 feet     Walk Time  6 minutes     # of Rest Breaks 1  4:41-5:40     MPH 1.61     METS 2.94     RPE 13     Perceived Dyspnea  2     VO2 Peak 10.28     Symptoms No     Resting HR 92 bpm     Resting BP 130/68     Resting Oxygen Saturation  100 %     Exercise Oxygen Saturation  during 6 min walk 99 %     Max Ex. HR 117 bpm     Max Ex. BP 130/74     2 Minute Post BP 128/72       Interval HR   1 Minute HR 103     2 Minute HR 108     3 Minute HR 114     4 Minute HR 117     5 Minute HR 113     6 Minute HR 108     2 Minute Post HR 102     Interval Heart Rate? Yes       Interval Oxygen   Interval Oxygen? Yes     Baseline Oxygen Saturation % 100 %  2L, exercises on 3L     1 Minute Oxygen Saturation % 100 %     1 Minute Liters of Oxygen 3 L     2 Minute Oxygen Saturation % 100 %     2 Minute Liters of Oxygen 3 L     3 Minute Oxygen Saturation % 100 %     3 Minute Liters of Oxygen 3 L     4 Minute Oxygen Saturation % 99 %     4 Minute Liters of Oxygen 3 L     5 Minute Oxygen Saturation % 99 %     5 Minute Liters of Oxygen 3 L     6 Minute Oxygen Saturation % 99 %     6 Minute Liters of Oxygen 3 L     2 Minute Post Oxygen Saturation % 100 %     2 Minute Post Liters of Oxygen 2 L              Oxygen Initial Assessment:  Oxygen Initial Assessment - 02/26/21 1030       Home Oxygen   Home Oxygen Device E-Tanks;Home Concentrator    Sleep Oxygen Prescription Continuous    Liters per minute 2    Home Exercise Oxygen Prescription Pulsed    Liters per minute 3    Home Resting Oxygen Prescription Continuous    Liters per minute 2     Compliance with Home Oxygen Use Yes      Initial 6 min Walk   Oxygen Used Continuous    Liters per minute 3      Program Oxygen Prescription   Program Oxygen Prescription Continuous    Liters per minute 3      Intervention   Short Term Goals To learn and exhibit compliance with exercise, home and travel O2 prescription;To learn and understand importance of monitoring SPO2 with pulse oximeter and demonstrate accurate use of the pulse oximeter.;To learn and understand importance of maintaining oxygen saturations>88%;To learn and demonstrate proper pursed lip breathing techniques or other breathing techniques. ;To learn and demonstrate proper use of respiratory medications    Long  Term Goals Exhibits compliance with exercise, home  and travel O2 prescription;Verbalizes importance of monitoring SPO2 with pulse oximeter  and return demonstration;Maintenance of O2 saturations>88%;Exhibits proper breathing techniques, such as pursed lip breathing or other method taught during program session;Compliance with respiratory medication;Demonstrates proper use of MDIs             Oxygen Re-Evaluation:  Oxygen Re-Evaluation     Row Name 03/24/21 1112             Program Oxygen Prescription   Program Oxygen Prescription None       Comments [name] did not want to use oxygen during exercise. Staff encouraged her to use oxygen, but she did not want to use it. Staff checked her O2 sat during exercise and remained at 93% RA         Home Oxygen   Home Oxygen Device E-Tanks;Home Concentrator       Sleep Oxygen Prescription Continuous       Liters per minute 2       Home Exercise Oxygen Prescription Pulsed       Liters per minute 3       Home Resting Oxygen Prescription Continuous       Liters per minute 2       Compliance with Home Oxygen Use Yes         Goals/Expected Outcomes   Short Term Goals To learn and exhibit compliance with exercise, home and travel O2 prescription;To learn and  understand importance of monitoring SPO2 with pulse oximeter and demonstrate accurate use of the pulse oximeter.;To learn and understand importance of maintaining oxygen saturations>88%;To learn and demonstrate proper pursed lip breathing techniques or other breathing techniques. ;To learn and demonstrate proper use of respiratory medications       Long  Term Goals Exhibits compliance with exercise, home  and travel O2 prescription;Verbalizes importance of monitoring SPO2 with pulse oximeter and return demonstration;Maintenance of O2 saturations>88%;Exhibits proper breathing techniques, such as pursed lip breathing or other method taught during program session;Compliance with respiratory medication;Demonstrates proper use of MDIs       Goals/Expected Outcomes Compliance and understanding of oxygen saturation and breathing techniques to decrease shortness of breath                Oxygen Discharge (Final Oxygen Re-Evaluation):  Oxygen Re-Evaluation - 03/24/21 1112       Program Oxygen Prescription   Program Oxygen Prescription None    Comments [name] did not want to use oxygen during exercise. Staff encouraged her to use oxygen, but she did not want to use it. Staff checked her O2 sat during exercise and remained at 93% RA      Home Oxygen   Home Oxygen Device E-Tanks;Home Concentrator    Sleep Oxygen Prescription Continuous    Liters per minute 2    Home Exercise Oxygen Prescription Pulsed    Liters per minute 3    Home Resting Oxygen Prescription Continuous    Liters per minute 2    Compliance with Home Oxygen Use Yes      Goals/Expected Outcomes   Short Term Goals To learn and exhibit compliance with exercise, home and travel O2 prescription;To learn and understand importance of monitoring SPO2 with pulse oximeter and demonstrate accurate use of the pulse oximeter.;To learn and understand importance of maintaining oxygen saturations>88%;To learn and demonstrate proper pursed lip  breathing techniques or other breathing techniques. ;To learn and demonstrate proper use of respiratory medications    Long  Term Goals Exhibits compliance with exercise, home  and travel O2 prescription;Verbalizes importance of monitoring SPO2 with  pulse oximeter and return demonstration;Maintenance of O2 saturations>88%;Exhibits proper breathing techniques, such as pursed lip breathing or other method taught during program session;Compliance with respiratory medication;Demonstrates proper use of MDIs    Goals/Expected Outcomes Compliance and understanding of oxygen saturation and breathing techniques to decrease shortness of breath             Initial Exercise Prescription:  Initial Exercise Prescription - 02/26/21 1300       Date of Initial Exercise RX and Referring Provider   Date 02/26/21    Referring Provider [place]    Expected Discharge Date 04/29/21      Oxygen   Oxygen Continuous    Liters 3    Maintain Oxygen Saturation 88% or higher      NuStep   Level 2    SPM 80    Minutes 15      Track   Minutes 15    METs 3.28      Prescription Details   Frequency (times per week) 2    Duration Progress to 30 minutes of continuous aerobic without signs/symptoms of physical distress      Intensity   THRR 40-80% of Max Heartrate 62-123    Ratings of Perceived Exertion 11-13    Perceived Dyspnea 0-4      Progression   Progression Continue to progress workloads to maintain intensity without signs/symptoms of physical distress.      Resistance Training   Training Prescription Yes    Weight red bands    Reps 10-15             Perform Capillary Blood Glucose checks as needed.  Exercise Prescription Changes:   Exercise Prescription Changes     Row Name 03/04/21 1159 03/09/21 1100           Response to Exercise   Blood Pressure (Admit) 124/68 --      Blood Pressure (Exercise) 144/80 --      Blood Pressure (Exit) 110/60 --      Heart Rate (Admit) 111 bpm --       Heart Rate (Exercise) 122 bpm --      Heart Rate (Exit) 110 bpm --      Oxygen Saturation (Admit) 99 % --      Oxygen Saturation (Exercise) 93 % --      Oxygen Saturation (Exit) 93 % --      Rating of Perceived Exertion (Exercise) 13 --      Perceived Dyspnea (Exercise) 1 --      Duration Progress to 30 minutes of  aerobic without signs/symptoms of physical distress --      Intensity THRR unchanged --        Resistance Training   Training Prescription Yes --      Weight red bands --      Reps 10-15 --      Time 10 Minutes --        Oxygen   Oxygen --  Has not used O2, O2 sats stable during exercise --        NuStep   Level 2 --      SPM 70 --      Minutes 15 --      METs 1.5 --        Track   Laps 7 --      Minutes 15 --      METs 2.53 --  Exercise Comments:   Exercise Comments     Row Name 03/02/21 1202           Exercise Comments Pt completed 1st day of exercise. [name] exercised for 15 min on the track and Nustep. She tolerated both well. [name] averaged 1.81 METs on the track and 1.5 METs at level 2 on the Nustep. She performed the warmup and cooldown standing without limitations. Will consider moving to the treadmill as this is pt's preference.                Exercise Goals and Review:   Exercise Goals     Row Name 02/26/21 1336 03/24/21 1108           Exercise Goals   Increase Physical Activity Yes Yes      Intervention Provide advice, education, support and counseling about physical activity/exercise needs.;Develop an individualized exercise prescription for aerobic and resistive training based on initial evaluation findings, risk stratification, comorbidities and participant's personal goals. Provide advice, education, support and counseling about physical activity/exercise needs.;Develop an individualized exercise prescription for aerobic and resistive training based on initial evaluation findings, risk stratification,  comorbidities and participant's personal goals.      Expected Outcomes Short Term: Attend rehab on a regular basis to increase amount of physical activity.;Long Term: Add in home exercise to make exercise part of routine and to increase amount of physical activity.;Long Term: Exercising regularly at least 3-5 days a week. Short Term: Attend rehab on a regular basis to increase amount of physical activity.;Long Term: Add in home exercise to make exercise part of routine and to increase amount of physical activity.;Long Term: Exercising regularly at least 3-5 days a week.      Increase Strength and Stamina Yes Yes      Intervention Provide advice, education, support and counseling about physical activity/exercise needs.;Develop an individualized exercise prescription for aerobic and resistive training based on initial evaluation findings, risk stratification, comorbidities and participant's personal goals. Provide advice, education, support and counseling about physical activity/exercise needs.;Develop an individualized exercise prescription for aerobic and resistive training based on initial evaluation findings, risk stratification, comorbidities and participant's personal goals.      Expected Outcomes Short Term: Increase workloads from initial exercise prescription for resistance, speed, and METs.;Short Term: Perform resistance training exercises routinely during rehab and add in resistance training at home;Long Term: Improve cardiorespiratory fitness, muscular endurance and strength as measured by increased METs and functional capacity (6MWT) Short Term: Increase workloads from initial exercise prescription for resistance, speed, and METs.;Short Term: Perform resistance training exercises routinely during rehab and add in resistance training at home;Long Term: Improve cardiorespiratory fitness, muscular endurance and strength as measured by increased METs and functional capacity (6MWT)      Able to understand  and use rate of perceived exertion (RPE) scale Yes Yes      Intervention Provide education and explanation on how to use RPE scale Provide education and explanation on how to use RPE scale      Expected Outcomes Short Term: Able to use RPE daily in rehab to express subjective intensity level;Long Term:  Able to use RPE to guide intensity level when exercising independently Short Term: Able to use RPE daily in rehab to express subjective intensity level;Long Term:  Able to use RPE to guide intensity level when exercising independently      Able to understand and use Dyspnea scale Yes Yes      Intervention Provide education and explanation on how  to use Dyspnea scale Provide education and explanation on how to use Dyspnea scale      Expected Outcomes Short Term: Able to use Dyspnea scale daily in rehab to express subjective sense of shortness of breath during exertion;Long Term: Able to use Dyspnea scale to guide intensity level when exercising independently Short Term: Able to use Dyspnea scale daily in rehab to express subjective sense of shortness of breath during exertion;Long Term: Able to use Dyspnea scale to guide intensity level when exercising independently      Knowledge and understanding of Target Heart Rate Range (THRR) Yes Yes      Intervention Provide education and explanation of THRR including how the numbers were predicted and where they are located for reference Provide education and explanation of THRR including how the numbers were predicted and where they are located for reference      Expected Outcomes Short Term: Able to state/look up THRR;Short Term: Able to use daily as guideline for intensity in rehab;Long Term: Able to use THRR to govern intensity when exercising independently Short Term: Able to state/look up THRR;Short Term: Able to use daily as guideline for intensity in rehab;Long Term: Able to use THRR to govern intensity when exercising independently      Understanding of  Exercise Prescription Yes Yes      Intervention Provide education, explanation, and written materials on patient's individual exercise prescription Provide education, explanation, and written materials on patient's individual exercise prescription      Expected Outcomes Short Term: Able to explain program exercise prescription;Long Term: Able to explain home exercise prescription to exercise independently Short Term: Able to explain program exercise prescription;Long Term: Able to explain home exercise prescription to exercise independently               Exercise Goals Re-Evaluation :  Exercise Goals Re-Evaluation     [place] Name 03/24/21 1109             Exercise Goal Re-Evaluation   Exercise Goals Review Increase Physical Activity;Increase Strength and Stamina;Able to understand and use rate of perceived exertion (RPE) scale;Able to understand and use Dyspnea scale;Knowledge and understanding of Target Heart Rate Range (THRR);Understanding of Exercise Prescription       Comments [name] has completed 1 exercise session. She has missed a few sessions due to illness. She exercised for 15 min on the track and Nustep. [name] tolerated both well. [name] averaged 1.81 METs on the track and 1.5 METs at level 2 on the Nustep. She performed the warmup and cooldown standing without limitations. It is too soon to note any discernable progressions. Will continue to monitor and progress as able.       Expected Outcomes Through exercise at rehab and home, the patient will decrease shortness of breath with daily activities and feel confident in carrying out an exercise regimen at home.                Discharge Exercise Prescription (Final Exercise Prescription Changes):  Exercise Prescription Changes - 03/09/21 1100       Response to Exercise   Blood Pressure (Admit) --    Blood Pressure (Exercise) --    Blood Pressure (Exit) --    Heart Rate (Admit) --    Heart Rate (Exercise) --    Heart  Rate (Exit) --    Oxygen Saturation (Admit) --    Oxygen Saturation (Exercise) --    Oxygen Saturation (Exit) --    Rating of Perceived Exertion (Exercise) --  Perceived Dyspnea (Exercise) --    Duration --    Intensity --      Resistance Training   Training Prescription --    Weight --    Reps --    Time --      Oxygen   Oxygen --      NuStep   Level --    SPM --    Minutes --    METs --      Track   Laps --    Minutes --    METs --             Nutrition:  Target Goals: Understanding of nutrition guidelines, daily intake of sodium 1500mg , cholesterol 200mg , calories 30% from fat and 7% or less from saturated fats, daily to have 5 or more servings of fruits and vegetables.  Biometrics:  Pre Biometrics - 02/26/21 1019       Pre Biometrics   Grip Strength 20 kg              Nutrition Therapy Plan and Nutrition Goals:   Nutrition Assessments:  MEDIFICTS Score Key: ?70 Need to make dietary changes  40-70 Heart Healthy Diet ? 40 Therapeutic Level Cholesterol Diet   Picture Your Plate Scores: <16 Unhealthy dietary pattern with much room for improvement. 41-50 Dietary pattern unlikely to meet recommendations for good health and room for improvement. 51-60 More healthful dietary pattern, with some room for improvement.  >60 Healthy dietary pattern, although there may be some specific behaviors that could be improved.    Nutrition Goals Re-Evaluation:   Nutrition Goals Discharge (Final Nutrition Goals Re-Evaluation):   Psychosocial: Target Goals: Acknowledge presence or absence of significant depression and/or stress, maximize coping skills, provide positive support system. Participant is able to verbalize types and ability to use techniques and skills needed for reducing stress and depression.  Initial Review & Psychosocial Screening:  Initial Psych Review & Screening - 02/26/21 1036       Initial Review   Current issues with History of  Depression;Current Anxiety/Panic   is on medication for anxiety and depression     Family Dynamics   Good Support System? Yes    Comments sister and friends      Barriers   Psychosocial barriers to participate in program There are no identifiable barriers or psychosocial needs.      Screening Interventions   Interventions Encouraged to exercise             Quality of Life Scores:  Scores of 19 and below usually indicate a poorer quality of life in these areas.  A difference of  2-3 points is a clinically meaningful difference.  A difference of 2-3 points in the total score of the Quality of Life Index has been associated with significant improvement in overall quality of life, self-image, physical symptoms, and general health in studies assessing change in quality of life.  PHQ-9: Recent Review Flowsheet Data     Depression screen [facility] 2/9 02/26/2021   Decreased Interest 0   Down, Depressed, Hopeless 0   PHQ - 2 Score 0   Altered sleeping 0   Tired, decreased energy 1   Change in appetite 0   Feeling bad or failure about yourself  1   Trouble concentrating 0   Moving slowly or fidgety/restless 0   Suicidal thoughts 0   PHQ-9 Score 2   Difficult doing work/chores Very difficult      Interpretation of Total Score  Total Score Depression Severity:  1-4 = Minimal depression, 5-9 = Mild depression, 10-14 = Moderate depression, 15-19 = Moderately severe depression, 20-27 = Severe depression   Psychosocial Evaluation and Intervention:  Psychosocial Evaluation - 02/26/21 1323       Psychosocial Evaluation & Interventions   Interventions Encouraged to exercise with the program and follow exercise prescription    Comments History of anxiety and depression on medications             Psychosocial Re-Evaluation:  Psychosocial Re-Evaluation     [place] Name 03/31/21 1326             Psychosocial Re-Evaluation   Current issues with History of Depression;Current  Anxiety/Panic       Comments Pt is currently on medication for depression and anxiety which she states is controlling it. No further psychosocial barriers or concerns identified at this time.       Expected Outcomes For [name]'s psychosocial issues to be controlled and no further barriers to participate in the PR program.       Interventions Encouraged to attend Pulmonary Rehabilitation for the exercise       Continue Psychosocial Services  Follow up required by staff                Psychosocial Discharge (Final Psychosocial Re-Evaluation):  Psychosocial Re-Evaluation - 03/31/21 1326       Psychosocial Re-Evaluation   Current issues with History of Depression;Current Anxiety/Panic    Comments Pt is currently on medication for depression and anxiety which she states is controlling it. No further psychosocial barriers or concerns identified at this time.    Expected Outcomes For [name]'s psychosocial issues to be controlled and no further barriers to participate in the PR program.    Interventions Encouraged to attend Pulmonary Rehabilitation for the exercise    Continue Psychosocial Services  Follow up required by staff             Education: Education Goals: Education classes will be provided on a weekly basis, covering required topics. Participant will state understanding/return demonstration of topics presented.  Learning Barriers/Preferences:  Learning Barriers/Preferences - 02/26/21 1038       Learning Barriers/Preferences   Learning Barriers Sight   wears glasses   Learning Preferences Audio;Computer/Internet;Group Instruction;Individual Instruction;Pictoral;Skilled Demonstration;Verbal Instruction;Video;Written Material             Education Topics: Risk Factor Reduction:  -Group instruction that is supported by a PowerPoint presentation. Instructor discusses the definition of a risk factor, different risk factors for pulmonary disease, and how the heart and  lungs work together.     Nutrition for Pulmonary Patient:  -Group instruction provided by PowerPoint slides, verbal discussion, and written materials to support subject matter. The instructor gives an explanation and review of healthy diet recommendations, which includes a discussion on weight management, recommendations for fruit and vegetable consumption, as well as protein, fluid, caffeine, fiber, sodium, sugar, and alcohol. Tips for eating when patients are short of breath are discussed.   Pursed Lip Breathing:  -Group instruction that is supported by demonstration and informational handouts. Instructor discusses the benefits of pursed lip and diaphragmatic breathing and detailed demonstration on how to preform both.     Oxygen Safety:  -Group instruction provided by PowerPoint, verbal discussion, and written material to support subject matter. There is an overview of What is Oxygen and Why do we need it.  Instructor also reviews how to create a safe environment for oxygen use, the  importance of using oxygen as prescribed, and the risks of noncompliance. There is a brief discussion on traveling with oxygen and resources the patient may utilize.   Oxygen Equipment:  -Group instruction provided by [facility] Staff utilizing handouts, written materials, and equipment demonstrations.   Signs and Symptoms:  -Group instruction provided by written material and verbal discussion to support subject matter. Warning signs and symptoms of infection, stroke, and heart attack are reviewed and when to call the physician/911 reinforced. Tips for preventing the spread of infection discussed.   Advanced Directives:  -Group instruction provided by verbal instruction and written material to support subject matter. Instructor reviews Advanced Directive laws and proper instruction for filling out document.   Pulmonary Video:  -Group video education that reviews the importance of medication and oxygen  compliance, exercise, good nutrition, pulmonary hygiene, and pursed lip and diaphragmatic breathing for the pulmonary patient.   Exercise for the Pulmonary Patient:  -Group instruction that is supported by a PowerPoint presentation. Instructor discusses benefits of exercise, core components of exercise, frequency, duration, and intensity of an exercise routine, importance of utilizing pulse oximetry during exercise, safety while exercising, and options of places to exercise outside of rehab.   Flowsheet Row PULMONARY REHAB OTHER RESPIRATORY from 03/25/2021 in [place]  Date 03/25/21  Educator Donnetta Simpers  [Handout]       Pulmonary Medications:  -Verbally interactive group education provided by instructor with focus on inhaled medications and proper administration.   Anatomy and Physiology of the Respiratory System and Intimacy:  -Group instruction provided by PowerPoint, verbal discussion, and written material to support subject matter. Instructor reviews respiratory cycle and anatomical components of the respiratory system and their functions. Instructor also reviews differences in obstructive and restrictive respiratory diseases with examples of each. Intimacy, Sex, and Sexuality differences are reviewed with a discussion on how relationships can change when diagnosed with pulmonary disease. Common sexual concerns are reviewed.   MD DAY -A group question and answer session with a medical doctor that allows participants to ask questions that relate to their pulmonary disease state.   OTHER EDUCATION -Group or individual verbal, written, or video instructions that support the educational goals of the pulmonary rehab program.   Holiday Eating Survival Tips:  -Group instruction provided by PowerPoint slides, verbal discussion, and written materials to support subject matter. The instructor gives patients tips, tricks, and techniques to help them not only  survive but enjoy the holidays despite the onslaught of food that accompanies the holidays.   Knowledge Questionnaire Score:  Knowledge Questionnaire Score - 02/26/21 1040       Knowledge Questionnaire Score   Pre Score 18/18             Core Components/Risk Factors/Patient Goals at Admission:  Personal Goals and Risk Factors at Admission - 02/26/21 1044       Core Components/Risk Factors/Patient Goals on Admission    Weight Management Weight Gain;Yes   wants to gain weight   Improve shortness of breath with ADL's Yes    Intervention Provide education, individualized exercise plan and daily activity instruction to help decrease symptoms of SOB with activities of daily living.    Expected Outcomes Short Term: Improve cardiorespiratory fitness to achieve a reduction of symptoms when performing ADLs;Long Term: Be able to perform more ADLs without symptoms or delay the onset of symptoms             Core Components/Risk Factors/Patient Goals Review:  Goals and Risk Factor Review     Row Name 03/31/21 1335             Core Components/Risk Factors/Patient Goals Review   Personal Goals Review Weight Management/Obesity       Review [name] wants to gain weight. She has only been able to attend 3 exercise sessions. She developed pneumonia and was absent. She fears coming out in cold weather.with it making her more SOB. We discussed this and we switched her to the afternoon class. This has worked better for her.She is walkng the track and is on the nu step for exercise using 2 liters of oxygen. Her oxygen saturations have been from 93-99%. This is her home perscription for her oxygen. She reports her SOB to be mild to moderately difficult, with the track being moderately.       Expected Outcomes See admission goals For [name] to be able to continue to increase her exercise levels and METS with maintating her oxygen saturations and curent oxygen usage. She wants to transition from the  track to the treadmill.                Core Components/Risk Factors/Patient Goals at Discharge (Final Review):   Goals and Risk Factor Review - 03/31/21 1335       Core Components/Risk Factors/Patient Goals Review   Personal Goals Review Weight Management/Obesity    Review [name] wants to gain weight. She has only been able to attend 3 exercise sessions. She developed pneumonia and was absent. She fears coming out in cold weather.with it making her more SOB. We discussed this and we switched her to the afternoon class. This has worked better for her.She is walkng the track and is on the nu step for exercise using 2 liters of oxygen. Her oxygen saturations have been from 93-99%. This is her home perscription for her oxygen. She reports her SOB to be mild to moderately difficult, with the track being moderately.    Expected Outcomes See admission goals For [name] to be able to continue to increase her exercise levels and METS with maintating her oxygen saturations and curent oxygen usage. She wants to transition from the track to the treadmill.             ITP Comments:   Comments: ITP REVIEW Pt is making expected progress toward pulmonary rehab goals after completing 3 sessions. Recommend continued exercise, life style modification, education, and utilization of breathing techniques to increase stamina and strength and decrease shortness of breath with exertion.  Dr. Rodman Pickle is Medical Director for Pulmonary Rehab at [facility].

## 2021-04-01 ENCOUNTER — Encounter (HOSPITAL_COMMUNITY): Payer: Medicare Other

## 2021-04-01 ENCOUNTER — Telehealth (HOSPITAL_COMMUNITY): Payer: Self-pay

## 2021-04-01 NOTE — Telephone Encounter (Signed)
Called to check on [facility] because she was not at class. She stated that her car was in the shop and she did not have another ride. I told [name] to call us the next time she cannot make it to class. She voiced understanding.

## 2021-04-06 ENCOUNTER — Telehealth (HOSPITAL_COMMUNITY): Payer: Self-pay | Admitting: *Deleted

## 2021-04-06 ENCOUNTER — Encounter (HOSPITAL_COMMUNITY): Payer: Medicare Other

## 2021-04-06 ENCOUNTER — Telehealth (HOSPITAL_COMMUNITY): Payer: Self-pay | Admitting: Internal Medicine

## 2021-04-06 ENCOUNTER — Encounter (HOSPITAL_COMMUNITY)
Admission: RE | Admit: 2021-04-06 | Payer: Medicare Other | Source: Ambulatory Visit | Attending: Pulmonary Disease | Admitting: Pulmonary Disease

## 2021-04-06 NOTE — Telephone Encounter (Signed)
Called patient re: absences from pulmonary rehab.  She states she has not been well and feels as if she is not comfortable exercising and that she has been lectured to instead of being understanding re: her poor health.  She is to consider over the weekend if she wants to return.  I reassured her that we will be more understanding in the future if she wants to return.

## 2021-04-07 ENCOUNTER — Encounter (HOSPITAL_BASED_OUTPATIENT_CLINIC_OR_DEPARTMENT_OTHER): Payer: Self-pay

## 2021-04-07 ENCOUNTER — Other Ambulatory Visit: Payer: Self-pay

## 2021-04-07 ENCOUNTER — Telehealth: Payer: Self-pay | Admitting: Adult Health

## 2021-04-07 ENCOUNTER — Ambulatory Visit (HOSPITAL_BASED_OUTPATIENT_CLINIC_OR_DEPARTMENT_OTHER)
Admission: RE | Admit: 2021-04-07 | Discharge: 2021-04-07 | Disposition: A | Discharge status: Home / Self Care | Payer: Medicare Other | Source: Ambulatory Visit | Attending: Pulmonary Disease | Admitting: Pulmonary Disease

## 2021-04-07 DIAGNOSIS — J449 Chronic obstructive pulmonary disease, unspecified: Secondary | ICD-10-CM | POA: Insufficient documentation

## 2021-04-07 MED ORDER — FORMOTEROL FUMARATE 20 MCG/2ML IN NEBU: 20.0000 ug | INHALATION_SOLUTION | Freq: Two times a day (BID) | RESPIRATORY_TRACT | 11 refills | Status: DC

## 2021-04-07 NOTE — Telephone Encounter (Signed)
Okay to change to perforomist.

## 2021-04-07 NOTE — Telephone Encounter (Signed)
Brandy Russell came into the office today and said that the patient's insurance prefers Perforomist instead of Brovana and would like for it to be switched.  Please advise if you are ok with this switch

## 2021-04-07 NOTE — Telephone Encounter (Signed)
Rx for performist has been sent to [facility] for pt. Called and spoke with pt letting her know of the switch from the brovana to the performist due to insurance and she verbalized understanding. Nothing further needd.

## 2021-04-08 ENCOUNTER — Encounter (HOSPITAL_COMMUNITY): Payer: Medicare Other

## 2021-04-12 ENCOUNTER — Other Ambulatory Visit: Payer: Self-pay

## 2021-04-12 ENCOUNTER — Ambulatory Visit (INDEPENDENT_AMBULATORY_CARE_PROVIDER_SITE_OTHER): Payer: Medicare Other | Admitting: Pulmonary Disease

## 2021-04-12 ENCOUNTER — Encounter: Payer: Self-pay | Admitting: Pulmonary Disease

## 2021-04-12 VITALS — BP 122/76 | HR 92 | Temp 98.0°F | Ht 66.0 in | Wt 119.2 lb

## 2021-04-12 DIAGNOSIS — B370 Candidal stomatitis: Secondary | ICD-10-CM

## 2021-04-12 DIAGNOSIS — J432 Centrilobular emphysema: Secondary | ICD-10-CM

## 2021-04-12 DIAGNOSIS — J439 Emphysema, unspecified: Secondary | ICD-10-CM | POA: Diagnosis not present

## 2021-04-12 DIAGNOSIS — J9611 Chronic respiratory failure with hypoxia: Secondary | ICD-10-CM | POA: Diagnosis not present

## 2021-04-12 MED ORDER — NYSTATIN 100000 UNIT/ML MT SUSP: 5.0000 mL | Freq: Four times a day (QID) | OROMUCOSAL | 0 refills | Status: DC

## 2021-04-12 NOTE — Patient Instructions (Signed)
Nystatin 5 ml swish and swallow 4 times daily for 5 days  Follow up in 4 months

## 2021-04-12 NOTE — Progress Notes (Signed)
Pulmonary, Critical Care, and Sleep Medicine  Chief Complaint  Patient presents with   Follow-up    3 month follow up. Pt states that she feels like she is dong good. Her COPD is doing well so far. Pt states she doesn't wear her Oxygen as much. Pt is currently on 2L of oxygen     Constitutional:  BP 122/76 (BP Location: Left Arm, Patient Position: Sitting, Cuff Size: Normal)    Pulse 92    Temp 98 F (36.7 C) (Oral)    Ht 5\' 6"  (1.676 m)    Wt 119 lb 3.2 oz (54.1 kg)    SpO2 97%    BMI 19.24 kg/m   Past Medical History:  Anxiety, Depression, CAD, GERD, PNA  Past Surgical History:  She  has a past surgical history that includes Tonsillectomy and adenoidectomy (1962); Dilation and curettage of uterus; Colonoscopy; and Polypectomy.  Brief Summary:  Brandy Russell is a 66 y.o. 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       Subjective:   She saw [name] Parrett several times for COPD exacerbation and pneumonia.  Needed several rounds of ABx and prednisone.  Started feeling better shortly after Christmas.  Her CT chest from earlier this month showed changes of emphysema and scarring, but pneumonia had resolved.  Has some cough still with sputum, but not bad.  Doesn't need to use oxygen as much as before.     Physical Exam:   Appearance - well kempt   ENMT - no sinus tenderness, no oral exudate, no LAN, Mallampati 2 airway, no stridor, white build up in back of mouth  Respiratory - decreased breath sounds bilaterally, no wheezing or rales  CV - s1s2 regular rate and rhythm, no murmurs  Ext - no clubbing, no edema  Skin - no rashes  Psych - normal mood and affect    Pulmonary testing:  Spirometry 05/30/12 >> FEV1 0.69 (26%), FEV1% 40 A1AT 05/30/12 >> 112, PI*MS ONO with RA 07/03/12 >> Test time 8 hrs 33 min.  Mean SpO2 92.6%, low SpO2 87%.  Spent 8 min with SpO2 < 88%. Sputum 07/23/15 >> Pseudomonas aeruginosa  Chest Imaging:  CT chest 04/04/09 >>  apical centrilobular emphysema, 3 mm RUL nodule, 4 mm RUL nodule Low dose CT chest 10/03/14 >> multiple pulmonary nodules up to 5 mm, mild centrilobular and paraseptal emphysema Low dose CT chest 10/06/15 >> atherosclerosis, mod centrilobular emphysema, bronchial wall thickening, scattered nodules up to 3.6 mm Esophagram 11/25/15 >> no specific abnormality Low dose CT chest 10/06/16 >> no change Low dose CT chest 03/23/18 >> atherosclerosis, multiple nodules with new nodule 7.7 mm RUL, centrilobular and paraseptal emphysema, 3 cm lesion in left lobe of liver, possible dilated bile duct LDCT chest 08/24/18 >> atherosclerosis, numerous tiny b/l nodules up to 7.7 mm, centrilobular emphysema, 2.8 cm density in liver, 5 mm stone in Lt kidney LDCT chest 08/27/19 >> new RUL nodule 7 mm, diffuse bronchial thickening, mod centrilobular and paraseptal emphysema, other nodules stable  LDCT 12/04/19 >> no change RUL nodule, RML nodule resolve CT chest 04/07/21 >> moderate emphysema and apical scarring, tiny b/l nodules stable, 3.1 cm cyst in Lt liver  Sleep Tests:  ONO with RA 03/13/18 >> test time 9 hrs 45 min.  Baseline SpO2 88%, low SpO2 81%.  Spent 5 hrs 57 min with SpO2 < 88%. ONO with RA 05/09/18 >> test time 10 hrs 1 min.  Basal SpO2 89.8%, low SpO2  65%.  Spent 202.8 min with SpO2 < 88%. ONO with 2 liters 06/12/18 >> test time 8 hrs 6 min.  Basal SpO2 97.5%, SpO2 low 92%.  Cardiac Tests:  Echo 02/08/05 >> EF 40 to 50% Echo 03/30/18 >> EF 55 to 60%, grade 2 DD  Social History:  She  reports that she quit smoking about 15 years ago. Her smoking use included cigarettes. She has a 70.00 pack-year smoking history. She has never used smokeless tobacco. She reports that she does not drink alcohol and does not use drugs.  Family History:  Her family history includes Alcohol abuse in her father and mother; Emphysema in her father and mother; Hypertension in her father; Lung cancer in her father; Melanoma in her  brother; Uterine cancer in her mother.     Assessment/Plan:   COPD with emphysema. 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 - 2 liters oxygen with sleep and exertion   Lung cancer screening. - will need to check whether she gets follow up CT in October 2023 or January 2024  Thrush. - nystatin swish and swallow for 5 days  Major depression. - f/u with Dr. Montel Culver   Coronary atherosclerosis, diastolic CHF. - followed by Dr. Jenkins Rouge with [place]   Bloating with irregular bowel pattern. - likely related to prior antibiotic use - improved - advised her to use probiotic or eat yogurt when she needs to go on antibiotics again  Time Spent Involved in Patient Care on Day of Examination:  36 minutes  Follow up:   Patient Instructions  Nystatin 5 ml swish and swallow 4 times daily for 5 days  Follow up in 4 months  Medication List:   Allergies as of 04/12/2021       Reactions   Levaquin [levofloxacin] Other (See Comments)   tendonitis   Penicillins Swelling   Did it involve swelling of the face/tongue/throat, SOB, or low BP? Unknown Did it involve sudden or severe rash/hives, skin peeling, or any reaction on the inside of your mouth or nose? Unknown Did you need to seek medical attention at a hospital or doctor's office? Unknown When did it last happen?  young child     If all above answers are "NO", may proceed with cephalosporin use.   Sulfa Antibiotics Nausea And Vomiting        Medication List        Accurate as of April 12, 2021 10:02 AM. If you have any questions, ask your nurse or doctor.          STOP taking these medications    azithromycin 500 MG tablet Commonly known as: Zithromax Stopped by: Chesley Mires, MD   clotrimazole 10 MG troche Commonly known as: MYCELEX Stopped by: Chesley Mires, MD   doxycycline 100 MG tablet Commonly known as: VIBRA-TABS Stopped by: Chesley Mires,  MD   formoterol 20 MCG/2ML nebulizer solution Commonly known as: Perforomist Stopped by: Chesley Mires, MD   predniSONE 10 MG tablet Commonly known as: DELTASONE Stopped by: Chesley Mires, MD       TAKE these medications    acetaminophen 325 MG tablet Commonly known as: TYLENOL Take 325 mg by mouth every 6 (six) hours as needed.   benzonatate 100 MG capsule Commonly known as: TESSALON Take 2 capsules (200 mg total) by mouth 3 (three) times daily as needed for cough.   buPROPion 150 MG 12 hr tablet Commonly known as:  WELLBUTRIN SR Take 1 tablet (150 mg total) by mouth 2 (two) times daily.   diclofenac sodium 1 % Gel Commonly known as: VOLTAREN Apply 1 application topically daily as needed (pain).   FIBER PO Take 1 tablet by mouth 2 (two) times a day.   gabapentin 100 MG capsule Commonly known as: NEURONTIN Take 1 capsule (100 mg total) by mouth 3 (three) times daily.   ibuprofen 200 MG tablet Commonly known as: ADVIL Take 400 mg by mouth every 6 (six) hours as needed for headache (pain).   nystatin 100000 UNIT/ML suspension Commonly known as: MYCOSTATIN Take 5 mLs (500,000 Units total) by mouth 4 (four) times daily. Started by: Chesley Mires, MD   omeprazole 20 MG capsule Commonly known as: PRILOSEC Take 20 mg by mouth daily.   pravastatin 40 MG tablet Commonly known as: PRAVACHOL Take 40 mg by mouth daily.   ProAir RespiClick 242 (90 Base) MCG/ACT Aepb Generic drug: Albuterol Sulfate Inhale 2 puffs into the lungs every 6 (six) hours as needed.   albuterol (2.5 MG/3ML) 0.083% nebulizer solution Commonly known as: PROVENTIL Take 3 mLs (2.5 mg total) by nebulization every 6 (six) hours as needed for wheezing or shortness of breath.   Trelegy Ellipta 100-62.5-25 MCG/ACT Aepb Generic drug: Fluticasone-Umeclidin-Vilant Inhale 1 puff into the lungs daily.        Signature:  Chesley Mires, MD [place] Pager - [phone] 04/12/2021, 10:02 AM

## 2021-04-13 ENCOUNTER — Encounter (HOSPITAL_COMMUNITY): Payer: Medicare Other

## 2021-04-14 ENCOUNTER — Other Ambulatory Visit: Payer: Self-pay

## 2021-04-14 DIAGNOSIS — Z87891 Personal history of nicotine dependence: Secondary | ICD-10-CM

## 2021-04-15 ENCOUNTER — Encounter (HOSPITAL_COMMUNITY): Payer: Medicare Other

## 2021-04-15 ENCOUNTER — Telehealth (HOSPITAL_COMMUNITY): Payer: Self-pay | Admitting: *Deleted

## 2021-04-15 NOTE — Telephone Encounter (Signed)
Pt was supposed to call the department to give [name] her decision if she was planning to return to PR program.We have not heard from her this week and she has not been her to exercise. I called pt but was not able to reach her, but was able to leave a message. I ask her to call to give [name] her decision.

## 2021-04-20 ENCOUNTER — Encounter (HOSPITAL_COMMUNITY): Payer: Medicare Other

## 2021-04-21 ENCOUNTER — Encounter (HOSPITAL_COMMUNITY): Payer: Self-pay | Admitting: *Deleted

## 2021-04-21 NOTE — Progress Notes (Signed)
I had placed a call to [facility] to find out if she was planning to return to the pulmonary rehab program. I was not able to reach her but, left her a message asking for a return call.I had yet to receive a call from her.Today I am sending Brandy Russell a letter from the pulmonary rehab program to inform her that she would be dropped from the program.

## 2021-04-22 ENCOUNTER — Encounter (HOSPITAL_COMMUNITY): Payer: Medicare Other

## 2021-04-22 NOTE — Progress Notes (Signed)
Discharge Progress Report  Patient Details  Name: Brandy Russell MRN: [id number] Date of Birth: 11-29-1955 Referring Provider:   [facility] Walk Test from 02/26/2021 in [place]  Referring Provider [place]        Number of Visits: 3  Reason for Discharge:  Early Exit:  Lack of attendance. [name] decided to stop attending PR after 3 sessions and said that she would call us to let know if she would return. We  placed a call to her and left a message but, we did not receive a return call. I mailed her a letter to let her know that she would be dropped from the program.  Smoking History:  Social History   Tobacco Use  Smoking Status Former   Packs/day: 2.00   Years: 35.00   Pack years: 70.00   Types: Cigarettes   Quit date: 03/28/2006   Years since quitting: 15.0  Smokeless Tobacco Never    Diagnosis:  Centrilobular emphysema ([place])  ADL UCSD:  Pulmonary Assessment Scores     Row Name 02/26/21 1112         ADL UCSD   ADL Phase Entry     SOB Score total 61       CAT Score   CAT Score 26       mMRC Score   mMRC Score 61              Initial Exercise Prescription:  Initial Exercise Prescription - 02/26/21 1300       Date of Initial Exercise RX and Referring Provider   Date 02/26/21    Referring Provider [place]    Expected Discharge Date 04/29/21      Oxygen   Oxygen Continuous    Liters 3    Maintain Oxygen Saturation 88% or higher      NuStep   Level 2    SPM 80    Minutes 15      Track   Minutes 15    METs 3.28      Prescription Details   Frequency (times per week) 2    Duration Progress to 30 minutes of continuous aerobic without signs/symptoms of physical distress      Intensity   THRR 40-80% of Max Heartrate 62-123    Ratings of Perceived Exertion 11-13    Perceived Dyspnea 0-4      Progression   Progression Continue to progress workloads to maintain intensity without  signs/symptoms of physical distress.      Resistance Training   Training Prescription Yes    Weight red bands    Reps 10-15             Discharge Exercise Prescription (Final Exercise Prescription Changes):  Exercise Prescription Changes - 04/06/21 1400       Response to Exercise   Blood Pressure (Admit) --    Blood Pressure (Exit) --    Heart Rate (Admit) --    Heart Rate (Exercise) --    Heart Rate (Exit) --    Oxygen Saturation (Admit) --    Oxygen Saturation (Exercise) --    Oxygen Saturation (Exit) --    Rating of Perceived Exertion (Exercise) --    Perceived Dyspnea (Exercise) --    Duration --    Intensity --      Resistance Training   Training Prescription --    Weight --    Reps --    Time --  Oxygen   Oxygen --    Liters --      NuStep   Level --    SPM --    Minutes --    METs --      Track   Laps --    Minutes --             Functional Capacity:  6 Minute Walk     Row Name 02/26/21 1306         6 Minute Walk   Phase Initial     Distance 852 feet     Walk Time 6 minutes     # of Rest Breaks 1  4:41-5:40     MPH 1.61     METS 2.94     RPE 13     Perceived Dyspnea  2     VO2 Peak 10.28     Symptoms No     Resting HR 92 bpm     Resting BP 130/68     Resting Oxygen Saturation  100 %     Exercise Oxygen Saturation  during 6 min walk 99 %     Max Ex. HR 117 bpm     Max Ex. BP 130/74     2 Minute Post BP 128/72       Interval HR   1 Minute HR 103     2 Minute HR 108     3 Minute HR 114     4 Minute HR 117     5 Minute HR 113     6 Minute HR 108     2 Minute Post HR 102     Interval Heart Rate? Yes       Interval Oxygen   Interval Oxygen? Yes     Baseline Oxygen Saturation % 100 %  2L, exercises on 3L     1 Minute Oxygen Saturation % 100 %     1 Minute Liters of Oxygen 3 L     2 Minute Oxygen Saturation % 100 %     2 Minute Liters of Oxygen 3 L     3 Minute Oxygen Saturation % 100 %     3 Minute Liters of  Oxygen 3 L     4 Minute Oxygen Saturation % 99 %     4 Minute Liters of Oxygen 3 L     5 Minute Oxygen Saturation % 99 %     5 Minute Liters of Oxygen 3 L     6 Minute Oxygen Saturation % 99 %     6 Minute Liters of Oxygen 3 L     2 Minute Post Oxygen Saturation % 100 %     2 Minute Post Liters of Oxygen 2 L              Psychological, QOL, Others - Outcomes: PHQ 2/9: Depression screen PHQ 2/9 02/26/2021  Decreased Interest 0  Down, Depressed, Hopeless 0  PHQ - 2 Score 0  Altered sleeping 0  Tired, decreased energy 1  Change in appetite 0  Feeling bad or failure about yourself  1  Trouble concentrating 0  Moving slowly or fidgety/restless 0  Suicidal thoughts 0  PHQ-9 Score 2  Difficult doing work/chores Very difficult  Some recent data might be hidden    Quality of Life:   Personal Goals: Goals established at orientation with interventions provided to work toward goal.  Personal Goals and Risk Factors at Admission -  02/26/21 1044       Core Components/Risk Factors/Patient Goals on Admission    Weight Management Weight Gain;Yes   wants to gain weight   Improve shortness of breath with ADL's Yes    Intervention Provide education, individualized exercise plan and daily activity instruction to help decrease symptoms of SOB with activities of daily living.    Expected Outcomes Short Term: Improve cardiorespiratory fitness to achieve a reduction of symptoms when performing ADLs;Long Term: Be able to perform more ADLs without symptoms or delay the onset of symptoms              Personal Goals Discharge:  Goals and Risk Factor Review     Row Name 03/31/21 1335             Core Components/Risk Factors/Patient Goals Review   Personal Goals Review Weight Management/Obesity       Review Brandy Russell wants to gain weight. She has only been able to attend 3 exercise sessions. She developed pneumonia and was absent. She fears coming out in cold weather.with it making her  more SOB. We discussed this and we switched her to the afternoon class. This has worked better for her.She is walkng the track and is on the nu step for exercise using 2 liters of oxygen. Her oxygen saturations have been from 93-99%. This is her home perscription for her oxygen. She reports her SOB to be mild to moderately difficult, with the track being moderately.       Expected Outcomes See admission goals For [name] to be able to continue to increase her exercise levels and METS with maintating her oxygen saturations and curent oxygen usage. She wants to transition from the track to the treadmill.                Exercise Goals and Review:  Exercise Goals     Row Name 02/26/21 1336 03/24/21 1108           Exercise Goals   Increase Physical Activity Yes Yes      Intervention Provide advice, education, support and counseling about physical activity/exercise needs.;Develop an individualized exercise prescription for aerobic and resistive training based on initial evaluation findings, risk stratification, comorbidities and participant's personal goals. Provide advice, education, support and counseling about physical activity/exercise needs.;Develop an individualized exercise prescription for aerobic and resistive training based on initial evaluation findings, risk stratification, comorbidities and participant's personal goals.      Expected Outcomes Short Term: Attend rehab on a regular basis to increase amount of physical activity.;Long Term: Add in home exercise to make exercise part of routine and to increase amount of physical activity.;Long Term: Exercising regularly at least 3-5 days a week. Short Term: Attend rehab on a regular basis to increase amount of physical activity.;Long Term: Add in home exercise to make exercise part of routine and to increase amount of physical activity.;Long Term: Exercising regularly at least 3-5 days a week.      Increase Strength and Stamina Yes Yes       Intervention Provide advice, education, support and counseling about physical activity/exercise needs.;Develop an individualized exercise prescription for aerobic and resistive training based on initial evaluation findings, risk stratification, comorbidities and participant's personal goals. Provide advice, education, support and counseling about physical activity/exercise needs.;Develop an individualized exercise prescription for aerobic and resistive training based on initial evaluation findings, risk stratification, comorbidities and participant's personal goals.      Expected Outcomes Short Term: Increase workloads from initial exercise prescription  for resistance, speed, and METs.;Short Term: Perform resistance training exercises routinely during rehab and add in resistance training at home;Long Term: Improve cardiorespiratory fitness, muscular endurance and strength as measured by increased METs and functional capacity (6MWT) Short Term: Increase workloads from initial exercise prescription for resistance, speed, and METs.;Short Term: Perform resistance training exercises routinely during rehab and add in resistance training at home;Long Term: Improve cardiorespiratory fitness, muscular endurance and strength as measured by increased METs and functional capacity (6MWT)      Able to understand and use rate of perceived exertion (RPE) scale Yes Yes      Intervention Provide education and explanation on how to use RPE scale Provide education and explanation on how to use RPE scale      Expected Outcomes Short Term: Able to use RPE daily in rehab to express subjective intensity level;Long Term:  Able to use RPE to guide intensity level when exercising independently Short Term: Able to use RPE daily in rehab to express subjective intensity level;Long Term:  Able to use RPE to guide intensity level when exercising independently      Able to understand and use Dyspnea scale Yes Yes      Intervention Provide  education and explanation on how to use Dyspnea scale Provide education and explanation on how to use Dyspnea scale      Expected Outcomes Short Term: Able to use Dyspnea scale daily in rehab to express subjective sense of shortness of breath during exertion;Long Term: Able to use Dyspnea scale to guide intensity level when exercising independently Short Term: Able to use Dyspnea scale daily in rehab to express subjective sense of shortness of breath during exertion;Long Term: Able to use Dyspnea scale to guide intensity level when exercising independently      Knowledge and understanding of Target Heart Rate Range (THRR) Yes Yes      Intervention Provide education and explanation of THRR including how the numbers were predicted and where they are located for reference Provide education and explanation of THRR including how the numbers were predicted and where they are located for reference      Expected Outcomes Short Term: Able to state/look up THRR;Short Term: Able to use daily as guideline for intensity in rehab;Long Term: Able to use THRR to govern intensity when exercising independently Short Term: Able to state/look up THRR;Short Term: Able to use daily as guideline for intensity in rehab;Long Term: Able to use THRR to govern intensity when exercising independently      Understanding of Exercise Prescription Yes Yes      Intervention Provide education, explanation, and written materials on patient's individual exercise prescription Provide education, explanation, and written materials on patient's individual exercise prescription      Expected Outcomes Short Term: Able to explain program exercise prescription;Long Term: Able to explain home exercise prescription to exercise independently Short Term: Able to explain program exercise prescription;Long Term: Able to explain home exercise prescription to exercise independently               Exercise Goals Re-Evaluation:  Exercise Goals  Re-Evaluation     [place] Name 03/24/21 1109             Exercise Goal Re-Evaluation   Exercise Goals Review Increase Physical Activity;Increase Strength and Stamina;Able to understand and use rate of perceived exertion (RPE) scale;Able to understand and use Dyspnea scale;Knowledge and understanding of Target Heart Rate Range (THRR);Understanding of Exercise Prescription       Comments Brandy Russell has  completed 1 exercise session. She has missed a few sessions due to illness. She exercised for 15 min on the track and Nustep. [name] tolerated both well. [name] averaged 1.81 METs on the track and 1.5 METs at level 2 on the Nustep. She performed the warmup and cooldown standing without limitations. It is too soon to note any discernable progressions. Will continue to monitor and progress as able.       Expected Outcomes Through exercise at rehab and home, the patient will decrease shortness of breath with daily activities and feel confident in carrying out an exercise regimen at home.                Nutrition & Weight - Outcomes:  Pre Biometrics - 02/26/21 1019       Pre Biometrics   Grip Strength 20 kg              Nutrition:   Nutrition Discharge:   Education Questionnaire Score:  Knowledge Questionnaire Score - 02/26/21 1040       Knowledge Questionnaire Score   Pre Score 18/18             Goals reviewed with patient; copy given to patient.

## 2021-04-22 NOTE — Addendum Note (Signed)
Encounter addended by: Deon Pilling, RN on: 04/22/2021 8:50 AM  Actions taken: Clinical Note Signed, Episode resolved

## 2021-04-27 ENCOUNTER — Encounter (HOSPITAL_COMMUNITY): Payer: Medicare Other

## 2021-04-29 ENCOUNTER — Encounter (HOSPITAL_COMMUNITY): Payer: Medicare Other

## 2021-05-04 ENCOUNTER — Ambulatory Visit (HOSPITAL_COMMUNITY): Payer: Medicare Other

## 2021-05-06 ENCOUNTER — Ambulatory Visit (HOSPITAL_COMMUNITY): Payer: Medicare Other

## 2021-05-11 ENCOUNTER — Ambulatory Visit (HOSPITAL_COMMUNITY): Payer: Medicare Other

## 2021-05-13 ENCOUNTER — Ambulatory Visit (HOSPITAL_COMMUNITY): Payer: Medicare Other

## 2021-08-17 ENCOUNTER — Encounter: Payer: Self-pay | Admitting: Pulmonary Disease

## 2021-08-17 NOTE — Telephone Encounter (Signed)
Sounds like she needs to be seen , I can work her in on Thursday, can double book  I am not in the office tomorrow.  Can begin Prednisone '20mg'$  daily for 5 days and zpack #1 to help until I see her in the office  If worse needs to go to ER  Please contact office for sooner follow up if symptoms do not improve or worsen or seek emergency care

## 2021-08-18 MED ORDER — AZITHROMYCIN 250 MG PO TABS: ORAL_TABLET | ORAL | 0 refills | Status: DC

## 2021-08-18 MED ORDER — PREDNISONE 10 MG PO TABS: 20.0000 mg | ORAL_TABLET | Freq: Every day | ORAL | 0 refills | Status: DC

## 2021-08-18 NOTE — Telephone Encounter (Signed)
Pt notified of scheduled OV on 08/19/21 and medication orders were placed. Nothing further needed at this time.

## 2021-08-19 ENCOUNTER — Ambulatory Visit: Payer: Medicare Other | Admitting: Adult Health

## 2021-09-13 ENCOUNTER — Ambulatory Visit (INDEPENDENT_AMBULATORY_CARE_PROVIDER_SITE_OTHER): Payer: Medicare Other | Admitting: Pulmonary Disease

## 2021-09-13 ENCOUNTER — Encounter: Payer: Self-pay | Admitting: Pulmonary Disease

## 2021-09-13 VITALS — BP 146/86 | HR 102 | Temp 98.2°F | Ht 66.0 in | Wt 115.6 lb

## 2021-09-13 DIAGNOSIS — J432 Centrilobular emphysema: Secondary | ICD-10-CM

## 2021-09-13 DIAGNOSIS — J441 Chronic obstructive pulmonary disease with (acute) exacerbation: Secondary | ICD-10-CM

## 2021-09-13 DIAGNOSIS — J9611 Chronic respiratory failure with hypoxia: Secondary | ICD-10-CM

## 2021-09-13 MED ORDER — PREDNISONE 20 MG PO TABS: 20.0000 mg | ORAL_TABLET | Freq: Every day | ORAL | 0 refills | Status: DC

## 2021-09-13 MED ORDER — DOXYCYCLINE HYCLATE 100 MG PO TABS: 100.0000 mg | ORAL_TABLET | Freq: Two times a day (BID) | ORAL | 0 refills | Status: DC

## 2021-09-13 NOTE — Addendum Note (Signed)
Addended by: Fran Lowes on: 09/13/2021 03:45 PM   Modules accepted: Orders

## 2021-09-13 NOTE — Patient Instructions (Signed)
Prednisone 20 mg daily for 2 weeks.  Doxycycline 100 mg twice per day for 7 days.  Will arrange for a flutter valve; use this after you use your inhaler.  Follow up in 4 months

## 2021-09-13 NOTE — Progress Notes (Signed)
[name]  Chief Complaint  Patient presents with   Follow-up    Follow up. Patient says shes not eating enough and not getting enough exercise.     Constitutional:  BP (!) 146/86 (BP Location: Right Arm, Patient Position: Sitting, Cuff Size: Normal)   Pulse (!) 102   Temp 98.2 F (36.8 C) (Oral)   Ht '5\' 6"'$  (1.676 m)   Wt 115 lb 9.6 oz (52.4 kg)   SpO2 96%   BMI 18.66 kg/m   Past Medical History:  Anxiety, Depression, CAD, GERD, PNA  Past Surgical History:  She  has a past surgical history that includes Tonsillectomy and adenoidectomy (1962); Dilation and curettage of uterus; Colonoscopy; and Polypectomy.  Brief Summary:  Brandy Russell is a 66 y.o. 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       Subjective:   She hasn't felt like herself since she had pneumonia.  She doesn't have energy and is only eating one meal a day.  She is worried about losing weight.  Has cough with yellow-green sputum.  She is using her oxygen more often.  She doesn't feel like her depression has gotten worse.  Physical Exam:   Appearance - well kempt   ENMT - no sinus tenderness, no oral exudate, no LAN, Mallampati 3 airway, no stridor  Respiratory - equal breath sounds bilaterally, no wheezing or rales  CV - s1s2 regular rate and rhythm, no murmurs  Ext - no clubbing, no edema  Skin - no rashes  Psych - normal mood and affect     Pulmonary testing:  Spirometry 05/30/12 >> FEV1 0.69 (26%), FEV1% 40 A1AT 05/30/12 >> 112, PI*MS ONO with RA 07/03/12 >> Test time 8 hrs 33 min.  Mean SpO2 92.6%, low SpO2 87%.  Spent 8 min with SpO2 < 88%. Sputum 07/23/15 >> Pseudomonas aeruginosa  Chest Imaging:  CT chest 04/04/09 >> apical centrilobular emphysema, 3 mm RUL nodule, 4 mm RUL nodule Low dose CT chest 10/03/14 >> multiple pulmonary nodules up to 5 mm, mild centrilobular and paraseptal emphysema Low dose CT chest 10/06/15 >>  atherosclerosis, mod centrilobular emphysema, bronchial wall thickening, scattered nodules up to 3.6 mm Esophagram 11/25/15 >> no specific abnormality Low dose CT chest 10/06/16 >> no change Low dose CT chest 03/23/18 >> atherosclerosis, multiple nodules with new nodule 7.7 mm RUL, centrilobular and paraseptal emphysema, 3 cm lesion in left lobe of liver, possible dilated bile duct LDCT chest 08/24/18 >> atherosclerosis, numerous tiny b/l nodules up to 7.7 mm, centrilobular emphysema, 2.8 cm density in liver, 5 mm stone in Lt kidney LDCT chest 08/27/19 >> new RUL nodule 7 mm, diffuse bronchial thickening, mod centrilobular and paraseptal emphysema, other nodules stable  LDCT 12/04/19 >> no change RUL nodule, RML nodule resolve CT chest 04/07/21 >> moderate emphysema and apical scarring, tiny b/l nodules stable, 3.1 cm cyst in Lt liver  Sleep Tests:  ONO with RA 03/13/18 >> test time 9 hrs 45 min.  Baseline SpO2 88%, low SpO2 81%.  Spent 5 hrs 57 min with SpO2 < 88%. ONO with RA 05/09/18 >> test time 10 hrs 1 min.  Basal SpO2 89.8%, low SpO2 65%.  Spent 202.8 min with SpO2 < 88%. ONO with 2 liters 06/12/18 >> test time 8 hrs 6 min.  Basal SpO2 97.5%, SpO2 low 92%.  Cardiac Tests:  Echo 02/08/05 >> EF 40 to 50% Echo 03/30/18 >> EF 55 to 60%, grade 2  DD  Social History:  She  reports that she quit smoking about 15 years ago. Her smoking use included cigarettes. She has a 70.00 pack-year smoking history. She has never used smokeless tobacco. She reports that she does not drink alcohol and does not use drugs.  Family History:  Her family history includes Alcohol abuse in her father and mother; Emphysema in her father and mother; Hypertension in her father; Lung cancer in her father; Melanoma in her brother; Uterine cancer in her mother.     Assessment/Plan:   COPD with emphysema. - seems to have another exacerbation - will give course of prednisone and doxycycline - continue trelegy 100 one puff  daily - prn albuterol - will arrange for flutter valve   Nocturnal hypoxemia from emphysema. - 2 liters oxygen with exertion and sleep   Lung cancer screening. - follow up low dose CT chest in January 2024  Major depression. - f/u with Dr. Montel Culver   Coronary atherosclerosis, diastolic CHF. - followed by Dr. Jenkins Rouge with [facility]   Time Spent Involved in Patient Care on Day of Examination:  37 minutes  Follow up:   Patient Instructions  Prednisone 20 mg daily for 2 weeks.  Doxycycline 100 mg twice per day for 7 days.  Will arrange for a flutter valve; use this after you use your inhaler.  Follow up in 4 months  Medication List:   Allergies as of 09/13/2021       Reactions   Levaquin [levofloxacin] Other (See Comments)   tendonitis   Penicillins Swelling   Did it involve swelling of the face/tongue/throat, SOB, or low BP? Unknown Did it involve sudden or severe rash/hives, skin peeling, or any reaction on the inside of your mouth or nose? Unknown Did you need to seek medical attention at a hospital or doctor's office? Unknown When did it last happen?  young child     If all above answers are "NO", may proceed with cephalosporin use.   Sulfa Antibiotics Nausea And Vomiting        Medication List        Accurate as of September 13, 2021  3:12 PM. If you have any questions, ask your nurse or doctor.          STOP taking these medications    azithromycin 250 MG tablet Commonly known as: Zithromax Z-Pak Stopped by: Chesley Mires, MD   benzonatate 100 MG capsule Commonly known as: TESSALON Stopped by: Chesley Mires, MD   nystatin 100000 UNIT/ML suspension Commonly known as: MYCOSTATIN Stopped by: Chesley Mires, MD       TAKE these medications    acetaminophen 325 MG tablet Commonly known as: TYLENOL Take 325 mg by mouth every 6 (six) hours as needed.   buPROPion 150 MG 12 hr tablet Commonly known as: WELLBUTRIN SR Take 1 tablet (150 mg  total) by mouth 2 (two) times daily.   diclofenac sodium 1 % Gel Commonly known as: VOLTAREN Apply 1 application topically daily as needed (pain).   doxycycline 100 MG tablet Commonly known as: VIBRA-TABS Take 1 tablet (100 mg total) by mouth 2 (two) times daily. Started by: Chesley Mires, MD   FIBER PO Take 1 tablet by mouth 2 (two) times a day.   gabapentin 100 MG capsule Commonly known as: NEURONTIN Take 1 capsule (100 mg total) by mouth 3 (three) times daily.   ibuprofen 200 MG tablet Commonly known as: ADVIL Take 400 mg by mouth every 6 (  six) hours as needed for headache (pain).   omeprazole 20 MG capsule Commonly known as: PRILOSEC Take 20 mg by mouth daily.   pravastatin 40 MG tablet Commonly known as: PRAVACHOL Take 40 mg by mouth daily.   predniSONE 20 MG tablet Commonly known as: DELTASONE Take 1 tablet (20 mg total) by mouth daily with breakfast. What changed: medication strength Changed by: Chesley Mires, MD   ProAir RespiClick 109 (90 Base) MCG/ACT Aepb Generic drug: Albuterol Sulfate Inhale 2 puffs into the lungs every 6 (six) hours as needed.   albuterol (2.5 MG/3ML) 0.083% nebulizer solution Commonly known as: PROVENTIL Take 3 mLs (2.5 mg total) by nebulization every 6 (six) hours as needed for wheezing or shortness of breath.   Trelegy Ellipta 100-62.5-25 MCG/ACT Aepb Generic drug: Fluticasone-Umeclidin-Vilant Inhale 1 puff into the lungs daily.        Signature:  Chesley Mires, MD [place] Pager - [phone] 09/13/2021, 3:12 PM

## 2021-10-01 ENCOUNTER — Encounter: Payer: Self-pay | Admitting: Pulmonary Disease

## 2021-10-01 NOTE — Telephone Encounter (Signed)
Dr. Halford Chessman please advise on the following My Chart message:   Do you think this is safe for me?  It is for 4 months in [place].  Two long visits a month.eigh Ann Brand < [email or web address] To: [email or web address]   Fri, Jul 7 at 3:35 PM   Hi Jana Half,   The study you have expressed interest in is for patients with severe COPD. The study is comparing two inhaled medications for COPD, both of which are FDA approved. The study investigates whether the delivery mechanism of one drug is better than the other for people who struggle to inhale quickly and deeply. The medications for the study are Spiriva and Yupelri.    The study would require you to be switched from Trelegy (triple combined anti-inflammatory, LAMA, LABA) to a LABA/ICS product (Symbicort, Breo, Advair, etc.) 48 prior to the Ipratropium Reversibility test and through the course of the study, since the class of study drug medication is one that is in your triple combination inhaler already.    Look forward to hearing from you after you speak with your pulmonologist.    Sincerely,  Darius Bump  [phone] Clinical Screening Specialist

## 2021-12-08 ENCOUNTER — Encounter: Payer: Self-pay | Admitting: Pulmonary Disease

## 2021-12-09 ENCOUNTER — Telehealth: Payer: Self-pay | Admitting: Pulmonary Disease

## 2021-12-09 NOTE — Telephone Encounter (Signed)
Patient called and stated that she left a mychart on 12/08/21 and had yet to get a response informed her that the nurses were working on returning calls and answering messages, told her i could put another message back for her, she stated it wouldn't do her any good to another message then and hung up

## 2021-12-10 MED ORDER — PREDNISONE 20 MG PO TABS: ORAL_TABLET | ORAL | 0 refills | Status: AC

## 2021-12-10 MED ORDER — AZITHROMYCIN 250 MG PO TABS: 250.0000 mg | ORAL_TABLET | Freq: Every day | ORAL | 0 refills | Status: DC

## 2021-12-10 NOTE — Telephone Encounter (Signed)
Please send script for zpak.  Please send script for prednisone 20 mg pill >> 2 pills daily for 3 days, 1.5 pills daily for 3 days, 1 pill daily for 3 days, 0.5 pills daily for 3 days.  She should schedule an ROV if not feeling better over the weekend.

## 2021-12-10 NOTE — Telephone Encounter (Signed)
Dr. Halford Chessman please advise

## 2021-12-10 NOTE — Telephone Encounter (Signed)
Spoke with pt who states increased fatigue, congestion. Pt denies fever/ GI upset. Pt states symptoms have been progressing for last several weeks. Pt stares ABX have worked well in the past and when given round in June resolved symptoms. Pt is also requesting prednisone taper. Dr. Halford Chessman please advise.

## 2021-12-10 NOTE — Telephone Encounter (Signed)
Spoke with pt and reviewed advise as dictated by Dr. Halford Chessman along with medication instructions. Pt stated understanding and verified pharmacy. Nothing further needed at this time.

## 2021-12-13 ENCOUNTER — Telehealth: Payer: Medicare Other | Admitting: Nurse Practitioner

## 2022-01-07 ENCOUNTER — Encounter: Payer: Self-pay | Admitting: Pulmonary Disease

## 2022-01-07 MED ORDER — NYSTATIN 100000 UNIT/ML MT SUSP: 500000.0000 [IU] | Freq: Four times a day (QID) | OROMUCOSAL | 0 refills | Status: DC

## 2022-01-07 NOTE — Telephone Encounter (Signed)
Please let her know I have sent script for nystatin 5 ml swish and swallow 4 times per day for 5 days.  She should schedule ROV if this doesn't improve her symptoms.

## 2022-01-07 NOTE — Telephone Encounter (Signed)
Dr. Sood, please see mychart message sent by pt and advise. °

## 2022-02-09 ENCOUNTER — Ambulatory Visit: Payer: Medicare Other | Admitting: Adult Health

## 2022-02-25 DIAGNOSIS — U071 COVID-19: Secondary | ICD-10-CM

## 2022-02-25 HISTORY — DX: COVID-19: U07.1

## 2022-03-02 ENCOUNTER — Other Ambulatory Visit: Payer: Self-pay | Admitting: Internal Medicine

## 2022-03-02 DIAGNOSIS — Z1231 Encounter for screening mammogram for malignant neoplasm of breast: Secondary | ICD-10-CM

## 2022-03-29 ENCOUNTER — Encounter (HOSPITAL_BASED_OUTPATIENT_CLINIC_OR_DEPARTMENT_OTHER): Payer: Self-pay | Admitting: Pulmonary Disease

## 2022-03-29 ENCOUNTER — Ambulatory Visit (INDEPENDENT_AMBULATORY_CARE_PROVIDER_SITE_OTHER): Payer: Medicare Other | Admitting: Pulmonary Disease

## 2022-03-29 VITALS — BP 122/78 | HR 97 | Temp 98.3°F | Ht 66.0 in | Wt 106.4 lb

## 2022-03-29 DIAGNOSIS — J9611 Chronic respiratory failure with hypoxia: Secondary | ICD-10-CM

## 2022-03-29 DIAGNOSIS — R64 Cachexia: Secondary | ICD-10-CM

## 2022-03-29 DIAGNOSIS — J449 Chronic obstructive pulmonary disease, unspecified: Secondary | ICD-10-CM

## 2022-03-29 DIAGNOSIS — J432 Centrilobular emphysema: Secondary | ICD-10-CM

## 2022-03-29 NOTE — Progress Notes (Signed)
[facility]  Chief Complaint  Patient presents with   Follow-up    Pt states she still feels weird after having Covid last month.     Constitutional:  BP 122/78 (BP Location: Left Arm, Patient Position: Sitting, Cuff Size: Normal)   Pulse 97   Temp 98.3 F (36.8 C) (Oral)   Ht '5\' 6"'$  (1.676 m)   Wt 106 lb 6.4 oz (48.3 kg)   SpO2 97%   BMI 17.17 kg/m   Past Medical History:  Anxiety, Depression, CAD, GERD, PNA, COVID 19 infection December 2023  Past Surgical History:  She  has a past surgical history that includes Tonsillectomy and adenoidectomy (1962); Dilation and curettage of uterus; Colonoscopy; and Polypectomy.  Brief Summary:  Brandy Russell is a 67 y.o. 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       Subjective:   She had COVID last month.  Breathing okay, but she has more brain fog.  She isn't needing oxygen during the day as much.  She still uses oxygen at night.  Has occasional cough with clear sputum.  Trelegy helps.  She is having more trouble keeping her weight up.  She was feeling depressed and thinks this might have impacted her diet.  Her mood has improved more recently.  She has appointment with Dr. Nelva Bush with neurosurgery later this month.  She is having more trouble with leg pains and is concerned this is related to back issues.  Physical Exam:   Appearance - well kempt, thin  ENMT - no sinus tenderness, no oral exudate, no LAN, Mallampati 2 airway, no stridor  Respiratory - decreased breath sounds bilaterally, no wheezing or rales  CV - s1s2 regular rate and rhythm, no murmurs  Ext - no clubbing, no edema  Skin - no rashes  Psych - normal mood and affect     Pulmonary testing:  Spirometry 05/30/12 >> FEV1 0.69 (26%), FEV1% 40 A1AT 05/30/12 >> 112, PI*MS ONO with RA 07/03/12 >> Test time 8 hrs 33 min.  Mean SpO2 92.6%, low SpO2 87%.  Spent 8 min with SpO2 < 88%. Sputum 07/23/15 >>  Pseudomonas aeruginosa  Chest Imaging:  CT chest 04/04/09 >> apical centrilobular emphysema, 3 mm RUL nodule, 4 mm RUL nodule Low dose CT chest 10/03/14 >> multiple pulmonary nodules up to 5 mm, mild centrilobular and paraseptal emphysema Low dose CT chest 10/06/15 >> atherosclerosis, mod centrilobular emphysema, bronchial wall thickening, scattered nodules up to 3.6 mm Esophagram 11/25/15 >> no specific abnormality Low dose CT chest 10/06/16 >> no change Low dose CT chest 03/23/18 >> atherosclerosis, multiple nodules with new nodule 7.7 mm RUL, centrilobular and paraseptal emphysema, 3 cm lesion in left lobe of liver, possible dilated bile duct LDCT chest 08/24/18 >> atherosclerosis, numerous tiny b/l nodules up to 7.7 mm, centrilobular emphysema, 2.8 cm density in liver, 5 mm stone in Lt kidney LDCT chest 08/27/19 >> new RUL nodule 7 mm, diffuse bronchial thickening, mod centrilobular and paraseptal emphysema, other nodules stable  LDCT 12/04/19 >> no change RUL nodule, RML nodule resolve CT chest 04/07/21 >> moderate emphysema and apical scarring, tiny b/l nodules stable, 3.1 cm cyst in Lt liver  Sleep Tests:  ONO with RA 03/13/18 >> test time 9 hrs 45 min.  Baseline SpO2 88%, low SpO2 81%.  Spent 5 hrs 57 min with SpO2 < 88%. ONO with RA 05/09/18 >> test time 10 hrs 1 min.  Basal SpO2 89.8%, low  SpO2 65%.  Spent 202.8 min with SpO2 < 88%. ONO with 2 liters 06/12/18 >> test time 8 hrs 6 min.  Basal SpO2 97.5%, SpO2 low 92%.  Cardiac Tests:  Echo 02/08/05 >> EF 40 to 50% Echo 03/30/18 >> EF 55 to 60%, grade 2 DD  Social History:  She  reports that she quit smoking about 16 years ago. Her smoking use included cigarettes. She has a 70.00 pack-year smoking history. She has never used smokeless tobacco. She reports that she does not drink alcohol and does not use drugs.  Family History:  Her family history includes Alcohol abuse in her father and mother; Emphysema in her father and mother; Hypertension  in her father; Lung cancer in her father; Melanoma in her brother; Uterine cancer in her mother.     Assessment/Plan:   COPD with emphysema. 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 - 2 liters oxygen with exertion and sleep   Lung cancer screening. - she has low dose CT chest scheduled for 04/08/22  Major depression. - f/u with Dr. Montel Culver   Coronary atherosclerosis, diastolic CHF. - previously followed by Dr. Jenkins Rouge with [place]  Leg pains. - she has appointment with Dr. Nelva Bush with neurosurgery scheduled in January 2024   Time Spent Involved in Patient Care on Day of Examination:  35 minutes  Follow up:   Patient Instructions  Our office will call you with the results of your low dose CT chest later this month  Follow up in 6 months  Medication List:   Allergies as of 03/29/2022       Reactions   Levaquin [levofloxacin] Other (See Comments)   tendonitis   Penicillins Swelling   Did it involve swelling of the face/tongue/throat, SOB, or low BP? Unknown Did it involve sudden or severe rash/hives, skin peeling, or any reaction on the inside of your mouth or nose? Unknown Did you need to seek medical attention at a hospital or doctor's office? Unknown When did it last happen?  young child     If all above answers are "NO", may proceed with cephalosporin use.   Sulfa Antibiotics Nausea And Vomiting        Medication List        Accurate as of March 29, 2022  9:22 AM. If you have any questions, ask your nurse or doctor.          STOP taking these medications    azithromycin 250 MG tablet Commonly known as: ZITHROMAX Stopped by: Chesley Mires, MD   doxycycline 100 MG tablet Commonly known as: VIBRA-TABS Stopped by: Chesley Mires,  MD       TAKE these medications    acetaminophen 325 MG tablet Commonly known as: TYLENOL Take 325 mg by mouth every 6 (six) hours as needed.   buPROPion 150 MG 12 hr tablet Commonly known as: WELLBUTRIN SR Take 1 tablet (150 mg total) by mouth 2 (two) times daily.   diclofenac sodium 1 % Gel Commonly known as: VOLTAREN Apply 1 application topically daily as needed (pain).   FIBER PO Take 1 tablet by mouth 2 (two) times a day.   gabapentin 100 MG capsule Commonly known as: NEURONTIN Take 1 capsule (100 mg total) by mouth 3 (three) times daily.  ibuprofen 200 MG tablet Commonly known as: ADVIL Take 400 mg by mouth every 6 (six) hours as needed for headache (pain).   nystatin 100000 UNIT/ML suspension Commonly known as: MYCOSTATIN Take 5 mLs (500,000 Units total) by mouth 4 (four) times daily.   omeprazole 20 MG capsule Commonly known as: PRILOSEC Take 20 mg by mouth daily.   pravastatin 40 MG tablet Commonly known as: PRAVACHOL Take 40 mg by mouth daily.   ProAir RespiClick 503 (90 Base) MCG/ACT Aepb Generic drug: Albuterol Sulfate Inhale 2 puffs into the lungs every 6 (six) hours as needed.   albuterol (2.5 MG/3ML) 0.083% nebulizer solution Commonly known as: PROVENTIL Take 3 mLs (2.5 mg total) by nebulization every 6 (six) hours as needed for wheezing or shortness of breath.   Trelegy Ellipta 100-62.5-25 MCG/ACT Aepb Generic drug: Fluticasone-Umeclidin-Vilant Inhale 1 puff into the lungs daily.        Signature:  Chesley Mires, MD [place] Pager - [phone] 03/29/2022, 9:22 AM

## 2022-03-29 NOTE — Patient Instructions (Signed)
Our office will call you with the results of your low dose CT chest later this month  Follow up in 6 months

## 2022-04-07 ENCOUNTER — Ambulatory Visit (HOSPITAL_BASED_OUTPATIENT_CLINIC_OR_DEPARTMENT_OTHER): Payer: Medicare Other

## 2022-04-08 ENCOUNTER — Ambulatory Visit (HOSPITAL_BASED_OUTPATIENT_CLINIC_OR_DEPARTMENT_OTHER)
Admission: RE | Admit: 2022-04-08 | Discharge: 2022-04-08 | Disposition: A | Discharge status: Home / Self Care | Payer: Medicare Other | Source: Ambulatory Visit | Attending: Acute Care | Admitting: Acute Care

## 2022-04-08 DIAGNOSIS — I700 Atherosclerosis of aorta: Secondary | ICD-10-CM | POA: Insufficient documentation

## 2022-04-08 DIAGNOSIS — R911 Solitary pulmonary nodule: Secondary | ICD-10-CM | POA: Diagnosis not present

## 2022-04-08 DIAGNOSIS — Z87891 Personal history of nicotine dependence: Secondary | ICD-10-CM

## 2022-04-08 DIAGNOSIS — J439 Emphysema, unspecified: Secondary | ICD-10-CM | POA: Diagnosis not present

## 2022-04-08 DIAGNOSIS — Z122 Encounter for screening for malignant neoplasm of respiratory organs: Secondary | ICD-10-CM | POA: Diagnosis not present

## 2022-04-12 ENCOUNTER — Telehealth: Payer: Self-pay | Admitting: Acute Care

## 2022-04-12 DIAGNOSIS — R911 Solitary pulmonary nodule: Secondary | ICD-10-CM

## 2022-04-12 DIAGNOSIS — Z87891 Personal history of nicotine dependence: Secondary | ICD-10-CM

## 2022-04-12 NOTE — Telephone Encounter (Signed)
Results/plan faxed to PCP.  Order placed for 3 month LCS LDCT for follow up nodule

## 2022-04-12 NOTE — Telephone Encounter (Signed)
I have called the patient with the results of her low dose screening CT Chest. I explained that her scan was read as a Lung RADS 4 A : suspicious findings, either short term follow up in 3 months or alternatively  PET Scan evaluation may be considered when there is a solid component of  8 mm or larger.  There is a 6.6 mm nodule, that is new, and it is in the left lower lobe. I explained that we will re-scan this in 3 months to ensure that it is stable. She is in agreement with this plan.  Brandy Russell, please fax results to PCP and place an order for a 3 month follow up.  I also explained the incidental  findings of moderate emphysema, aortic atherosclerosis and Moderate coronary artery calcifications. She was placed on Pravocol by her PCP, but she is not taking it. I have asked her to discuss this with her PCP, and she said that she would.  Thanks

## 2022-04-28 ENCOUNTER — Ambulatory Visit: Payer: Medicare Other

## 2022-05-09 ENCOUNTER — Ambulatory Visit: Payer: Medicare Other

## 2022-06-14 ENCOUNTER — Telehealth: Payer: Self-pay | Admitting: Diagnostic Neuroimaging

## 2022-06-14 ENCOUNTER — Ambulatory Visit (INDEPENDENT_AMBULATORY_CARE_PROVIDER_SITE_OTHER): Payer: Medicare Other | Admitting: Diagnostic Neuroimaging

## 2022-06-14 ENCOUNTER — Encounter: Payer: Self-pay | Admitting: Diagnostic Neuroimaging

## 2022-06-14 VITALS — BP 115/69 | HR 104 | Ht 66.0 in | Wt 104.0 lb

## 2022-06-14 DIAGNOSIS — G629 Polyneuropathy, unspecified: Secondary | ICD-10-CM

## 2022-06-14 NOTE — Telephone Encounter (Signed)
[facility] is going to take this patient. 

## 2022-06-14 NOTE — Patient Instructions (Signed)
  NUMBNESS / PAIN IN FEET (since Nov 2023) + CHRONIC LOW BACK PAIN SPINAL STENOSIS (since 2020) - will check neuropathy labs; at risk for nutritional deficiency, falls, decline - continue pain mgmt per Dr. Nelva Bush and PCP - will refer to home health agency

## 2022-06-14 NOTE — Progress Notes (Signed)
[facility]  PATIENT: Brandy Russell DOB: 09-01-1955  REFERRING CLINICIAN: Suella Broad, MD HISTORY FROM: patient  REASON FOR VISIT: new consult   HISTORICAL  CHIEF COMPLAINT:  Chief Complaint  Patient presents with   Numbness    RM 6 alone Pt is well, reports she has been having tingling, burning, numbness and pain in feet since Nov. Pain can radiate to her knees     HISTORY OF PRESENT ILLNESS:   67 year old female here for evaluation of numbness and tingling.  Patient has had issues with numbness tingling in toes feet hands since at least 2020.  These have progressed over time.  She went to PCP and then was referred to pain management clinic, and then referred to me.  She has some issues with low back pain.  She had MRI of the lumbar spine demonstrating multilevel degenerative disease with spinal stenosis at L4-5.  She was treated conservatively with medical management.  Patient also living alone.  She is having trouble managing her ADLs.  She states that her home has become quite in disarray with bugs and clutter.  She does not feel like she has the energy or ability to clean up her home.  She is struggling to take care of herself.  She has a few friends who help out a little bit here and there but she mainly relies on other people.  She is only eating once a day and sometimes not at all.  She has lost significant amount of weight.   REVIEW OF SYSTEMS: Full 14 system review of systems performed and negative with exception of: as per HPI.  ALLERGIES: Allergies  Allergen Reactions   Levaquin [Levofloxacin] Other (See Comments)    tendonitis   Penicillins Swelling     Did it involve swelling of the face/tongue/throat, SOB, or low BP? Unknown Did it involve sudden or severe rash/hives, skin peeling, or any reaction on the inside of your mouth or nose? Unknown Did you need to seek medical attention at a hospital or doctor's office? Unknown When did it  last happen?  young child     If all above answers are "NO", may proceed with cephalosporin use.    Sulfa Antibiotics Nausea And Vomiting    HOME MEDICATIONS: Outpatient Medications Prior to Visit  Medication Sig Dispense Refill   acetaminophen (TYLENOL) 325 MG tablet Take 325 mg by mouth every 6 (six) hours as needed.     albuterol (PROVENTIL) (2.5 MG/3ML) 0.083% nebulizer solution Take 3 mLs (2.5 mg total) by nebulization every 6 (six) hours as needed for wheezing or shortness of breath. 75 mL 3   Albuterol Sulfate (PROAIR RESPICLICK) 123XX123 (90 Base) MCG/ACT AEPB Inhale 2 puffs into the lungs every 6 (six) hours as needed. 3 each 1   ALPRAZolam (XANAX) 0.25 MG tablet Take 0.25 mg by mouth at bedtime as needed.     buPROPion (WELLBUTRIN SR) 150 MG 12 hr tablet Take 1 tablet (150 mg total) by mouth 2 (two) times daily. 180 tablet 1   diclofenac sodium (VOLTAREN) 1 % GEL Apply 1 application topically daily as needed (pain).   2   doxycycline (VIBRA-TABS) 100 MG tablet Take 100 mg by mouth daily.     FIBER PO Take 1 tablet by mouth 2 (two) times a day.     Fluticasone-Umeclidin-Vilant (TRELEGY ELLIPTA) 100-62.5-25 MCG/INH AEPB Inhale 1 puff into the lungs daily. 60 each 11   gabapentin (NEURONTIN) 300 MG capsule Take 2 capsules by  mouth 3 (three) times daily.     hydrocortisone 2.5 % cream Apply 1 Application topically daily.     ibuprofen (ADVIL,MOTRIN) 200 MG tablet Take 400 mg by mouth every 6 (six) hours as needed for headache (pain).      omeprazole (PRILOSEC) 20 MG capsule Take 20 mg by mouth daily.     pravastatin (PRAVACHOL) 40 MG tablet Take 40 mg by mouth daily.     gabapentin (NEURONTIN) 100 MG capsule Take 1 capsule (100 mg total) by mouth 3 (three) times daily. 270 capsule 0   nystatin (MYCOSTATIN) 100000 UNIT/ML suspension Take 5 mLs (500,000 Units total) by mouth 4 (four) times daily. (Patient not taking: Reported on 06/14/2022) 60 mL 0   No facility-administered medications prior  to visit.    PAST MEDICAL HISTORY: Past Medical History:  Diagnosis Date   Allergy    Anxiety and depression    CAD (coronary artery disease)    incidental finding on CT scan for lung ca screening   Constipation    COPD with emphysema ([place])    COVID-19 02/2022   Depression    GERD (gastroesophageal reflux disease)    Herpes    face, uses suppresive therapy intermittently   Seasonal allergies     PAST SURGICAL HISTORY: Past Surgical History:  Procedure Laterality Date   COLONOSCOPY     DILATION AND CURETTAGE OF UTERUS     POLYPECTOMY     TONSILLECTOMY AND ADENOIDECTOMY  1962    FAMILY HISTORY: Family History  Problem Relation Age of Onset   Alcohol abuse Mother    Uterine cancer Mother    Emphysema Mother    Alcohol abuse Father    Lung cancer Father    Hypertension Father    Emphysema Father    Melanoma Brother    Colon cancer Neg Hx    Esophageal cancer Neg Hx    Rectal cancer Neg Hx    Stomach cancer Neg Hx    Colon polyps Neg Hx     SOCIAL HISTORY: Social History   Socioeconomic History   Marital status: Divorced    Spouse name: Not on file   Number of children: 0   Years of education: Not on file   Highest education level: Bachelor's degree (e.g., BA, AB, BS)  Occupational History   Occupation: artists  Tobacco Use   Smoking status: Former    Packs/day: 2.00    Years: 35.00    Additional pack years: 0.00    Total pack years: 70.00    Types: Cigarettes    Quit date: 03/28/2006    Years since quitting: 16.2   Smokeless tobacco: Never  Vaping Use   Vaping Use: Never used  Substance and Sexual Activity   Alcohol use: No   Drug use: No   Sexual activity: Never  Other Topics Concern   Not on file  Social History Narrative   Work or School: 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 Wt Readings from Last 3 Encounters:  06/14/22 104 lb (47.2 kg)  03/29/22 106 lb 6.4 oz (48.3 kg)  09/13/21 115 lb 9.6 oz (52.4 kg)   FRAIL APPEARING; DRY LIPS, ANGULAR CHEILITIS  CARDIOVASCULAR: Examination of carotid arteries is normal; no carotid bruits Regular rate and rhythm, no murmurs Examination of peripheral vascular system by observation and palpation is normal  EYES: Ophthalmoscopic exam of optic discs and posterior segments is normal; no papilledema or hemorrhages No results found.  MUSCULOSKELETAL: Gait, strength, tone, movements noted in Neurologic exam below  NEUROLOGIC: MENTAL STATUS:      No data to display         awake, alert, oriented to person, place and time recent and remote memory intact normal attention and concentration language fluent, comprehension intact, naming intact fund of knowledge appropriate  CRANIAL NERVE:  2nd - no papilledema on fundoscopic exam 2nd, 3rd, 4th, 6th - pupils equal and reactive to light, visual fields full to confrontation, extraocular muscles intact, no nystagmus 5th - facial sensation symmetric 7th - facial strength symmetric 8th - hearing intact 9th - palate elevates symmetrically, uvula midline 11th - shoulder shrug symmetric 12th - tongue protrusion midline  MOTOR:  normal bulk and tone, DIFFUSE 4/5 strength in the BUE, BLE  SENSORY:  normal and symmetric to light touch, temperature, vibration; EXCEPT DECR IN TOES / FEET  COORDINATION:   finger-nose-finger, fine finger movements normal  REFLEXES:  deep tendon reflexes TRACE and symmetric  GAIT/STATION:  ANTALGIC GAIT     DIAGNOSTIC DATA (LABS, IMAGING, TESTING) - I reviewed patient records, labs, notes, testing and imaging myself where available.  Lab Results  Component Value Date   WBC 4.4 07/29/2018   HGB 12.7 07/29/2018   HCT 38.6 07/29/2018   MCV 103.8 (H) 07/29/2018   PLT 253 07/29/2018      Component Value Date/Time   NA 135 07/29/2018 1837   NA 136 05/25/2018 1027   K 4.2 07/29/2018 1837   CL 99 07/29/2018 1837   CO2 23 07/29/2018 1837   GLUCOSE 129 (H) 07/29/2018 1837   BUN 7 (L) 07/29/2018 1837   BUN 5 (L) 05/25/2018 1027   CREATININE 0.66 07/29/2018 1837   CALCIUM 9.6 07/29/2018 1837   PROT 7.0 07/29/2018 1837   ALBUMIN 4.3 07/29/2018 1837   AST 32 07/29/2018 1837   ALT 26 07/29/2018 1837   ALKPHOS 76 07/29/2018 1837   BILITOT 0.3 07/29/2018 1837   GFRNONAA >60 07/29/2018 1837   GFRAA >60 07/29/2018 1837   Lab Results  Component Value Date   CHOL 250 (H) 11/11/2014   HDL 133.20 11/11/2014   LDLCALC 106 (H) 11/11/2014   LDLDIRECT 102.8 04/16/2012   TRIG 55.0 11/11/2014   CHOLHDL 2 11/11/2014   Lab Results  Component Value Date   HGBA1C 5.5 09/19/2013   No results found for: "VITAMINB12" Lab Results  Component Value Date   TSH 1.13 09/19/2013     ASSESSMENT AND PLAN  67 y.o. year old female here with:  Dx:  1. Neuropathy     PLAN:  NUMBNESS / PAIN IN FEET (since Nov 2023) + CHRONIC LOW BACK PAIN SPINAL STENOSIS (since 2020) - will  check neuropathy labs; at risk for nutritional deficiency, falls, decline - continue pain mgmt per Dr. Nelva Bush and PCP  WEIGHT LOSS, FRAILTY, LIVING ALONE, LACK OF SOCIAL SUPPORT - will refer to home health agency, social work, PCP; concern about patient's ability to safely live at home and take care of her basic needs  Orders Placed This Encounter  Procedures   Hemoglobin A1c    Vitamin B12   TSH   CBC with Differential/Platelet   Comprehensive metabolic panel   Multiple Myeloma Panel (SPEP&IFE w/QIG)   ANA,IFA RA Diag Pnl w/rflx Tit/Patn   Vitamin B1   Vitamin B6   Zinc   Copper, serum   Ceruloplasmin   Ambulatory referral to [facility] [place]   Return for pending if symptoms worsen or fail to improve, pending test results.  I reviewed images, labs, notes, records myself. I summarized findings and reviewed with patient, for this high risk condition (neuropathy + failure to thrive) requiring high complexity decision making.    Penni Bombard, MD [id number], [id number] Certified in Neurology, Neurophysiology and Neuroimaging  [facility] [address] [place], [place] [address] [phone]

## 2022-06-15 ENCOUNTER — Telehealth: Payer: Self-pay | Admitting: Diagnostic Neuroimaging

## 2022-06-15 NOTE — Telephone Encounter (Signed)
Pt called stated Dr. Leta Baptist or his nurse are sending emails to her personal email then they are being deleted. Stated the Dr. Leta Baptist left her pain medication up to the bone doctor that she doesn't even go to. Pt voice changed several times during our phone conversation.

## 2022-06-15 NOTE — Telephone Encounter (Signed)
Contacted pt back, her main concern was Dr Leta Baptist noting he was leaving her pain up to her bone doctor. She denied the pain in her back, she was mostly concerned with the pain in her feet. I informed her we are waiting on her neuropathy labs to come back to see if they give any explanation to the symptoms she is having. A referral was also placed for [facility], PT/OT to help her in home. Advised her to continue pain mgmt per Dr. Nelva Bush and PCP until we can get labs back. She verbally understood and was appreciative.

## 2022-06-16 NOTE — Telephone Encounter (Signed)
Please see my previous note below, the patient is having sever pain. Home health has called stating she had denied therapy due to pain.

## 2022-06-16 NOTE — Telephone Encounter (Signed)
Please result labs when available, pt is still having sever pain in feet and unsteady gait. If labs are normal, what are next steps? She stated she was not seeing anyone in pain management.

## 2022-06-16 NOTE — Telephone Encounter (Signed)
[place] from [place] called wanting provider to be aware that the pt will have a delay on starting her therapy per her request. Pt states she is in a lot of pain and does not want to receive therapy today nor tomorrow, If any questions please call [phone]

## 2022-06-17 LAB — ZINC: Zinc: 77 ug/dL (ref 44–115)

## 2022-06-21 LAB — COMPREHENSIVE METABOLIC PANEL
ALT: 69 IU/L — ABNORMAL HIGH (ref 0–32)
AST: 98 IU/L — ABNORMAL HIGH (ref 0–40)
Albumin/Globulin Ratio: 2.1 (ref 1.2–2.2)
Albumin: 4.8 g/dL (ref 3.9–4.9)
Alkaline Phosphatase: 100 IU/L (ref 44–121)
BUN/Creatinine Ratio: 17 (ref 12–28)
BUN: 15 mg/dL (ref 8–27)
Bilirubin Total: 0.4 mg/dL (ref 0.0–1.2)
CO2: 21 mmol/L (ref 20–29)
Calcium: 10.2 mg/dL (ref 8.7–10.3)
Chloride: 92 mmol/L — ABNORMAL LOW (ref 96–106)
Creatinine, Ser: 0.87 mg/dL (ref 0.57–1.00)
Globulin, Total: 2.3 g/dL (ref 1.5–4.5)
Glucose: 83 mg/dL (ref 70–99)
Potassium: 4.6 mmol/L (ref 3.5–5.2)
Sodium: 133 mmol/L — ABNORMAL LOW (ref 134–144)
Total Protein: 7.1 g/dL (ref 6.0–8.5)
eGFR: 73 mL/min/{1.73_m2} (ref >59)

## 2022-06-21 LAB — ANA,IFA RA DIAG PNL W/RFLX TIT/PATN
ANA Titer 1: NEGATIVE
Cyclic Citrullin Peptide Ab: 6 units (ref 0–19)
Rheumatoid fact SerPl-aCnc: 14.2 IU/mL — ABNORMAL HIGH (ref <14.0)

## 2022-06-21 LAB — COPPER, SERUM: Copper: 114 ug/dL (ref 80–158)

## 2022-06-21 LAB — CBC WITH DIFFERENTIAL/PLATELET
Basophils Absolute: 0.1 10*3/uL (ref 0.0–0.2)
Basos: 1 %
EOS (ABSOLUTE): 0.2 10*3/uL (ref 0.0–0.4)
Eos: 3 %
Hematocrit: 40.1 % (ref 34.0–46.6)
Hemoglobin: 13.5 g/dL (ref 11.1–15.9)
Immature Grans (Abs): 0 10*3/uL (ref 0.0–0.1)
Immature Granulocytes: 0 %
Lymphocytes Absolute: 1.3 10*3/uL (ref 0.7–3.1)
Lymphs: 19 %
MCH: 32.8 pg (ref 26.6–33.0)
MCHC: 33.7 g/dL (ref 31.5–35.7)
MCV: 98 fL — ABNORMAL HIGH (ref 79–97)
Monocytes Absolute: 0.6 10*3/uL (ref 0.1–0.9)
Monocytes: 9 %
Neutrophils Absolute: 4.4 10*3/uL (ref 1.4–7.0)
Neutrophils: 68 %
Platelets: 324 10*3/uL (ref 150–450)
RBC: 4.11 x10E6/uL (ref 3.77–5.28)
RDW: 11.7 % (ref 11.7–15.4)
WBC: 6.5 10*3/uL (ref 3.4–10.8)

## 2022-06-21 LAB — MULTIPLE MYELOMA PANEL, SERUM
Albumin SerPl Elph-Mcnc: 4.2 g/dL (ref 2.9–4.4)
Albumin/Glob SerPl: 1.5 (ref 0.7–1.7)
Alpha 1: 0.2 g/dL (ref 0.0–0.4)
Alpha2 Glob SerPl Elph-Mcnc: 0.7 g/dL (ref 0.4–1.0)
B-Globulin SerPl Elph-Mcnc: 1 g/dL (ref 0.7–1.3)
Gamma Glob SerPl Elph-Mcnc: 0.9 g/dL (ref 0.4–1.8)
Globulin, Total: 2.9 g/dL (ref 2.2–3.9)
IgA/Immunoglobulin A, Serum: 329 mg/dL (ref 87–352)
IgG (Immunoglobin G), Serum: 867 mg/dL (ref 586–1602)
IgM (Immunoglobulin M), Srm: 126 mg/dL (ref 26–217)

## 2022-06-21 LAB — VITAMIN B6: Vitamin B6: 8.7 ug/L (ref 3.4–65.2)

## 2022-06-21 LAB — VITAMIN B12: Vitamin B-12: 578 pg/mL (ref 232–1245)

## 2022-06-21 LAB — HEMOGLOBIN A1C
Est. average glucose Bld gHb Est-mCnc: 94 mg/dL
Hgb A1c MFr Bld: 4.9 % (ref 4.8–5.6)

## 2022-06-21 LAB — TSH: TSH: 3 u[IU]/mL (ref 0.450–4.500)

## 2022-06-21 LAB — CERULOPLASMIN: Ceruloplasmin: 28.4 mg/dL (ref 19.0–39.0)

## 2022-06-21 LAB — VITAMIN B1: Thiamine: 76.9 nmol/L (ref 66.5–200.0)

## 2022-06-23 ENCOUNTER — Ambulatory Visit: Payer: Medicare Other

## 2022-06-27 NOTE — Telephone Encounter (Signed)
Contacted pt back, she stated the company we referred to is not working out. They are mixing up time frames PT will not working with her any more until she has nurse visit. She was requesting a referral to another agency to do in office PT and to get in faster. I advised her I understand that she would like this to urgently be done but her safety is priority and I don't feel her doing PT outside of home would be best. She has had another fall due to gait and has a wound on her head. Even in office I had to help her walk due to imbalance and I do not feel it wold be best for her to have to do all that transferring in/out of car and in/out of office with her gait being as impaired as it is. I informed her to try to call the office to see if she can be seen sooner. If all else fails, call us back and we will try another agency but the process will start over. She verbally understood and was appreciative.

## 2022-06-27 NOTE — Telephone Encounter (Signed)
Pt states that although she does not have a car she has family/friends that can take her to PT if that could be arranged versus someone coming to her home, please call pt to discuss this as an option.

## 2022-06-28 ENCOUNTER — Ambulatory Visit: Payer: Medicare Other

## 2022-06-30 ENCOUNTER — Ambulatory Visit: Payer: Self-pay

## 2022-06-30 NOTE — Telephone Encounter (Signed)
  Chief Complaint: Neuropathy in feet - pain Symptoms: Pain Frequency: ongoing Pertinent Negatives: Patient denies  Disposition: [] ED /[] Urgent Care (no appt availability in office) / [] Appointment(In office/virtual)/ []  [facility]/ [] Home Care/ [] Refused Recommended Disposition /[]  Mobile Bus/ [x]  Follow-up with PCP Additional Notes: Pt called as she is experiencing ongoing neuropathy pain in her feet. Pt has discussed with PCP, ortho and a neurologist. Referred pt back to PCP for follow up and care. Pt disconnected the call.   Reason for Disposition  [1] SEVERE pain (e.g., excruciating, unable to do any normal activities) AND [2] not improved after 2 hours of pain medicine  Answer Assessment - Initial Assessment Questions 1. ONSET: "When did the pain start?"      Unsure - ongoing 2. LOCATION: "Where is the pain located?"      feet 3. PAIN: "How bad is the pain?"    (Scale 1-10; or mild, moderate, severe)  - MILD (1-3): doesn't interfere with normal activities.   - MODERATE (4-7): interferes with normal activities (e.g., work or school) or awakens from sleep, limping.   - SEVERE (8-10): excruciating pain, unable to do any normal activities, unable to walk.      Severe 4. WORK OR EXERCISE: "Has there been any recent work or exercise that involved this part of the body?"       5. CAUSE: "What do you think is causing the foot pain?"     neuropathy 6. OTHER SYMPTOMS: "Do you have any other symptoms?" (e.g., leg pain, rash, fever, numbness)  Protocols used: Foot Pain-A-AH

## 2022-07-14 ENCOUNTER — Telehealth: Payer: Self-pay | Admitting: Pulmonary Disease

## 2022-07-14 MED ORDER — CLOTRIMAZOLE 10 MG MT TROC: 10.0000 mg | Freq: Every day | OROMUCOSAL | 0 refills | Status: DC

## 2022-07-14 NOTE — Telephone Encounter (Signed)
Please send clotrimazole troche 10 mg five times daily for 7 days.

## 2022-07-14 NOTE — Telephone Encounter (Signed)
Called patient and she states that she believes that she has thrush again. She states in the past she took Clotrimazole.   Sir can we send something in for her thrush.  Please advise   Thank you

## 2022-07-14 NOTE — Telephone Encounter (Signed)
Pt calling in bc she is having oral thrush and usually she takes Clotrimazole  Pharmacy: [facility]

## 2022-07-14 NOTE — Telephone Encounter (Signed)
Called and spoke with pt letting her know the info per VS and she verbalized understanding. Rx for clotrimazole has been sent in for pt.nothing further needed.

## 2022-07-15 ENCOUNTER — Other Ambulatory Visit: Payer: Self-pay | Admitting: *Deleted

## 2022-07-15 MED ORDER — CLOTRIMAZOLE 10 MG MT TROC: 10.0000 mg | Freq: Every day | OROMUCOSAL | 0 refills | Status: AC

## 2022-07-15 NOTE — Telephone Encounter (Signed)
Patient called back to let you know that her script was sent to the wrong pharmacy.  She said she wanted it sent to the [facility] pharmacy on [address].  Please advise.

## 2022-07-18 ENCOUNTER — Ambulatory Visit (HOSPITAL_BASED_OUTPATIENT_CLINIC_OR_DEPARTMENT_OTHER): Admission: RE | Admit: 2022-07-18 | Payer: Medicare Other | Source: Ambulatory Visit

## 2022-09-20 ENCOUNTER — Ambulatory Visit (HOSPITAL_BASED_OUTPATIENT_CLINIC_OR_DEPARTMENT_OTHER): Payer: Medicare Other | Admitting: Pulmonary Disease

## 2022-10-06 ENCOUNTER — Telehealth: Payer: Self-pay | Admitting: Pulmonary Disease

## 2022-10-06 NOTE — Telephone Encounter (Signed)
Called and spoke with pt. Patient wants to know if she can an antibiotic could be called in for her cough.   VS, please advise.

## 2022-10-06 NOTE — Telephone Encounter (Signed)
Pt calling in bc she has a bad cough that keeps coming and going which is causing a low grade fever

## 2022-10-06 NOTE — Telephone Encounter (Signed)
Patient calling back. Patient denied scheduling an appointment due to not being able to drive. Please advise

## 2022-10-06 NOTE — Telephone Encounter (Signed)
Called the pt and there was no answer- please offer her an acute visit  LMTCB for the pt and routing back to [facility] since this is her primary location

## 2022-10-07 MED ORDER — AZITHROMYCIN 250 MG PO TABS: ORAL_TABLET | ORAL | 0 refills | Status: AC

## 2022-10-07 MED ORDER — PREDNISONE 10 MG PO TABS: ORAL_TABLET | ORAL | 0 refills | Status: AC

## 2022-10-07 NOTE — Telephone Encounter (Signed)
Called and spoke with patient, advised of recommendations per Dr. Craige Cotta.  There is currently nothing open for 2 weeks.  Advised patient to call back when she is feeling better so we can schedule a follow visit.  She verbalized understanding.  Nothing further needed.

## 2022-10-07 NOTE — Telephone Encounter (Signed)
I have sent scripts for zithromax and prednisone.  Please schedule ROV with me or an NP in within next two weeks.  Okay to schedule as video visit.

## 2022-12-27 DEATH — deceased
